# Patient Record
Sex: Female | Born: 1960 | Race: White | Hispanic: No | Smoking: Current every day smoker
Health system: Southern US, Community
[De-identification: ages and names within clinical notes are randomized; demographics above are authoritative.]

## PROBLEM LIST (undated history)

## (undated) DIAGNOSIS — K219 Gastro-esophageal reflux disease without esophagitis: Secondary | ICD-10-CM

## (undated) DIAGNOSIS — I10 Essential (primary) hypertension: Secondary | ICD-10-CM

## (undated) DIAGNOSIS — M1990 Unspecified osteoarthritis, unspecified site: Secondary | ICD-10-CM

## (undated) DIAGNOSIS — F419 Anxiety disorder, unspecified: Secondary | ICD-10-CM

## (undated) DIAGNOSIS — E7800 Pure hypercholesterolemia, unspecified: Secondary | ICD-10-CM

## (undated) HISTORY — DX: Essential (primary) hypertension: I10

## (undated) HISTORY — DX: Pure hypercholesterolemia, unspecified: E78.00

## (undated) HISTORY — DX: Unspecified osteoarthritis, unspecified site: M19.90

## (undated) HISTORY — DX: Gastro-esophageal reflux disease without esophagitis: K21.9

## (undated) HISTORY — DX: Anxiety disorder, unspecified: F41.9

---

## 1999-07-08 ENCOUNTER — Ambulatory Visit (HOSPITAL_COMMUNITY): Admission: RE | Admit: 1999-07-08 | Discharge: 1999-07-08 | Payer: Self-pay | Admitting: Pediatric Dentistry

## 1999-07-19 ENCOUNTER — Other Ambulatory Visit: Admission: RE | Admit: 1999-07-19 | Discharge: 1999-07-19 | Payer: Self-pay | Admitting: *Deleted

## 2000-01-29 ENCOUNTER — Emergency Department (HOSPITAL_COMMUNITY): Admission: EM | Admit: 2000-01-29 | Discharge: 2000-01-29 | Payer: Self-pay | Admitting: Emergency Medicine

## 2003-02-01 ENCOUNTER — Emergency Department (HOSPITAL_COMMUNITY): Admission: EM | Admit: 2003-02-01 | Discharge: 2003-02-01 | Payer: Self-pay | Admitting: Emergency Medicine

## 2004-06-25 ENCOUNTER — Ambulatory Visit (HOSPITAL_COMMUNITY): Admission: RE | Admit: 2004-06-25 | Discharge: 2004-06-25 | Payer: Self-pay | Admitting: Neurosurgery

## 2005-01-18 ENCOUNTER — Emergency Department (HOSPITAL_COMMUNITY): Admission: EM | Admit: 2005-01-18 | Discharge: 2005-01-19 | Payer: Self-pay | Admitting: Plastic Surgery

## 2006-03-29 ENCOUNTER — Ambulatory Visit: Payer: Self-pay | Admitting: Family Medicine

## 2006-04-19 ENCOUNTER — Ambulatory Visit: Payer: Self-pay | Admitting: Family Medicine

## 2006-07-26 ENCOUNTER — Ambulatory Visit: Payer: Self-pay | Admitting: Family Medicine

## 2007-03-28 ENCOUNTER — Telehealth: Payer: Self-pay | Admitting: Family Medicine

## 2007-03-30 ENCOUNTER — Telehealth: Payer: Self-pay | Admitting: Family Medicine

## 2007-07-11 ENCOUNTER — Telehealth: Payer: Self-pay | Admitting: Family Medicine

## 2007-07-24 ENCOUNTER — Ambulatory Visit: Payer: Self-pay | Admitting: Family Medicine

## 2007-07-24 DIAGNOSIS — F341 Dysthymic disorder: Secondary | ICD-10-CM | POA: Insufficient documentation

## 2007-07-24 DIAGNOSIS — F988 Other specified behavioral and emotional disorders with onset usually occurring in childhood and adolescence: Secondary | ICD-10-CM | POA: Insufficient documentation

## 2007-07-24 DIAGNOSIS — G43009 Migraine without aura, not intractable, without status migrainosus: Secondary | ICD-10-CM | POA: Insufficient documentation

## 2007-08-01 ENCOUNTER — Telehealth: Payer: Self-pay | Admitting: Family Medicine

## 2007-08-30 LAB — HM MAMMOGRAPHY

## 2007-10-03 ENCOUNTER — Telehealth: Payer: Self-pay | Admitting: Family Medicine

## 2007-12-05 ENCOUNTER — Telehealth: Payer: Self-pay | Admitting: Family Medicine

## 2007-12-18 ENCOUNTER — Telehealth: Payer: Self-pay | Admitting: Family Medicine

## 2008-01-02 ENCOUNTER — Telehealth: Payer: Self-pay | Admitting: Family Medicine

## 2008-01-25 ENCOUNTER — Telehealth: Payer: Self-pay | Admitting: Family Medicine

## 2008-01-31 ENCOUNTER — Ambulatory Visit: Payer: Self-pay | Admitting: Family Medicine

## 2008-01-31 DIAGNOSIS — M549 Dorsalgia, unspecified: Secondary | ICD-10-CM | POA: Insufficient documentation

## 2008-03-06 ENCOUNTER — Telehealth: Payer: Self-pay | Admitting: Family Medicine

## 2008-03-13 ENCOUNTER — Telehealth: Payer: Self-pay | Admitting: Family Medicine

## 2008-06-25 ENCOUNTER — Ambulatory Visit: Payer: Self-pay | Admitting: Family Medicine

## 2008-07-03 LAB — CONVERTED CEMR LAB: Pap Smear: NORMAL

## 2008-07-18 LAB — CONVERTED CEMR LAB: Pap Smear: NORMAL

## 2008-09-24 ENCOUNTER — Telehealth: Payer: Self-pay | Admitting: Family Medicine

## 2008-10-01 ENCOUNTER — Ambulatory Visit: Payer: Self-pay | Admitting: Family Medicine

## 2008-10-01 DIAGNOSIS — N938 Other specified abnormal uterine and vaginal bleeding: Secondary | ICD-10-CM | POA: Insufficient documentation

## 2008-10-01 DIAGNOSIS — N949 Unspecified condition associated with female genital organs and menstrual cycle: Secondary | ICD-10-CM

## 2008-10-01 LAB — CONVERTED CEMR LAB
Glucose, Urine, Semiquant: NEGATIVE
Nitrite: NEGATIVE
Specific Gravity, Urine: 1.02
Urobilinogen, UA: 0.2
WBC Urine, dipstick: NEGATIVE
pH: 6

## 2008-10-02 ENCOUNTER — Encounter: Payer: Self-pay | Admitting: Family Medicine

## 2008-10-07 LAB — CONVERTED CEMR LAB
ALT: 18 units/L (ref 0–35)
AST: 25 units/L (ref 0–37)
Albumin: 3.9 g/dL (ref 3.5–5.2)
Alkaline Phosphatase: 50 units/L (ref 39–117)
BUN: 14 mg/dL (ref 6–23)
Basophils Absolute: 0 10*3/uL (ref 0.0–0.1)
Basophils Relative: 0.1 % (ref 0.0–3.0)
Bilirubin, Direct: 0.1 mg/dL (ref 0.0–0.3)
CO2: 30 meq/L (ref 19–32)
Calcium: 9.4 mg/dL (ref 8.4–10.5)
Chloride: 107 meq/L (ref 96–112)
Cholesterol: 218 mg/dL (ref 0–200)
Creatinine, Ser: 1.1 mg/dL (ref 0.4–1.2)
Direct LDL: 138.1 mg/dL
Eosinophils Absolute: 0.1 10*3/uL (ref 0.0–0.7)
Eosinophils Relative: 0.7 % (ref 0.0–5.0)
FSH: 4.1 milliintl units/mL
GFR calc Af Amer: 68 mL/min
GFR calc non Af Amer: 56 mL/min
Glucose, Bld: 88 mg/dL (ref 70–99)
HCT: 40.4 % (ref 36.0–46.0)
HDL: 51 mg/dL (ref >39.0)
Hemoglobin: 14.2 g/dL (ref 12.0–15.0)
Lymphocytes Relative: 21.6 % (ref 12.0–46.0)
MCHC: 35.1 g/dL (ref 30.0–36.0)
MCV: 94.7 fL (ref 78.0–100.0)
Monocytes Absolute: 0.5 10*3/uL (ref 0.1–1.0)
Monocytes Relative: 5 % (ref 3.0–12.0)
Neutro Abs: 6.7 10*3/uL (ref 1.4–7.7)
Neutrophils Relative %: 72.6 % (ref 43.0–77.0)
Platelets: 255 10*3/uL (ref 150–400)
Potassium: 5.3 meq/L — ABNORMAL HIGH (ref 3.5–5.1)
RBC: 4.27 M/uL (ref 3.87–5.11)
RDW: 12.4 % (ref 11.5–14.6)
Sodium: 143 meq/L (ref 135–145)
TSH: 1.01 microintl units/mL (ref 0.35–5.50)
Total Bilirubin: 0.5 mg/dL (ref 0.3–1.2)
Total CHOL/HDL Ratio: 4.3
Total Protein: 7 g/dL (ref 6.0–8.3)
Triglycerides: 183 mg/dL — ABNORMAL HIGH (ref 0–149)
VLDL: 37 mg/dL (ref 0–40)
Vit D, 1,25-Dihydroxy: 15 — ABNORMAL LOW (ref 30–89)
WBC: 9.3 10*3/uL (ref 4.5–10.5)

## 2008-10-09 ENCOUNTER — Telehealth: Payer: Self-pay | Admitting: Family Medicine

## 2008-12-09 ENCOUNTER — Telehealth: Payer: Self-pay | Admitting: Family Medicine

## 2008-12-24 ENCOUNTER — Ambulatory Visit: Payer: Self-pay | Admitting: Family Medicine

## 2008-12-24 DIAGNOSIS — E559 Vitamin D deficiency, unspecified: Secondary | ICD-10-CM | POA: Insufficient documentation

## 2008-12-29 LAB — CONVERTED CEMR LAB: Vit D, 25-Hydroxy: 17 ng/mL — ABNORMAL LOW (ref 30–89)

## 2009-03-24 ENCOUNTER — Ambulatory Visit: Payer: Self-pay | Admitting: Family Medicine

## 2009-05-26 ENCOUNTER — Telehealth: Payer: Self-pay | Admitting: Family Medicine

## 2009-06-25 ENCOUNTER — Ambulatory Visit: Payer: Self-pay | Admitting: Family Medicine

## 2009-08-04 ENCOUNTER — Ambulatory Visit: Payer: Self-pay | Admitting: Family Medicine

## 2009-08-04 DIAGNOSIS — Q6650 Congenital pes planus, unspecified foot: Secondary | ICD-10-CM | POA: Insufficient documentation

## 2009-08-05 ENCOUNTER — Telehealth: Payer: Self-pay | Admitting: Family Medicine

## 2009-09-29 ENCOUNTER — Telehealth: Payer: Self-pay | Admitting: Family Medicine

## 2009-10-01 ENCOUNTER — Ambulatory Visit: Payer: Self-pay | Admitting: Family Medicine

## 2009-12-01 ENCOUNTER — Telehealth: Payer: Self-pay | Admitting: Family Medicine

## 2009-12-15 ENCOUNTER — Telehealth: Payer: Self-pay | Admitting: Family Medicine

## 2009-12-16 ENCOUNTER — Ambulatory Visit: Payer: Self-pay | Admitting: Family Medicine

## 2009-12-16 DIAGNOSIS — K219 Gastro-esophageal reflux disease without esophagitis: Secondary | ICD-10-CM | POA: Insufficient documentation

## 2009-12-30 ENCOUNTER — Telehealth: Payer: Self-pay | Admitting: Family Medicine

## 2010-01-12 ENCOUNTER — Telehealth: Payer: Self-pay | Admitting: Family Medicine

## 2010-03-03 ENCOUNTER — Telehealth: Payer: Self-pay | Admitting: Family Medicine

## 2010-03-16 ENCOUNTER — Telehealth: Payer: Self-pay | Admitting: Family Medicine

## 2010-03-23 ENCOUNTER — Emergency Department (HOSPITAL_COMMUNITY): Admission: EM | Admit: 2010-03-23 | Discharge: 2010-03-23 | Payer: Self-pay | Admitting: Emergency Medicine

## 2010-05-27 ENCOUNTER — Telehealth: Payer: Self-pay | Admitting: Family Medicine

## 2010-05-28 ENCOUNTER — Ambulatory Visit: Payer: Self-pay | Admitting: Family Medicine

## 2010-05-28 DIAGNOSIS — E663 Overweight: Secondary | ICD-10-CM | POA: Insufficient documentation

## 2010-05-28 DIAGNOSIS — H60339 Swimmer's ear, unspecified ear: Secondary | ICD-10-CM | POA: Insufficient documentation

## 2010-06-09 ENCOUNTER — Telehealth: Payer: Self-pay | Admitting: Family Medicine

## 2010-06-11 ENCOUNTER — Ambulatory Visit: Payer: Self-pay | Admitting: Family Medicine

## 2010-06-11 LAB — CONVERTED CEMR LAB
Bilirubin Urine: NEGATIVE
Glucose, Urine, Semiquant: NEGATIVE
Ketones, urine, test strip: NEGATIVE
Nitrite: NEGATIVE
Protein, U semiquant: NEGATIVE
Specific Gravity, Urine: 1.015
Urobilinogen, UA: 0.2
WBC Urine, dipstick: NEGATIVE
pH: 7.5

## 2010-06-14 LAB — CONVERTED CEMR LAB
ALT: 23 units/L (ref 0–35)
AST: 18 units/L (ref 0–37)
Albumin: 3.7 g/dL (ref 3.5–5.2)
Alkaline Phosphatase: 72 units/L (ref 39–117)
BUN: 9 mg/dL (ref 6–23)
Basophils Absolute: 0 10*3/uL (ref 0.0–0.1)
Basophils Relative: 0.3 % (ref 0.0–3.0)
Bilirubin, Direct: 0.1 mg/dL (ref 0.0–0.3)
CO2: 31 meq/L (ref 19–32)
Calcium: 8.9 mg/dL (ref 8.4–10.5)
Chloride: 98 meq/L (ref 96–112)
Cholesterol: 235 mg/dL — ABNORMAL HIGH (ref 0–200)
Creatinine, Ser: 0.8 mg/dL (ref 0.4–1.2)
Direct LDL: 132.5 mg/dL
Eosinophils Absolute: 0 10*3/uL (ref 0.0–0.7)
Eosinophils Relative: 0.4 % (ref 0.0–5.0)
GFR calc non Af Amer: 81.67 mL/min (ref >60)
Glucose, Bld: 85 mg/dL (ref 70–99)
HCT: 38.5 % (ref 36.0–46.0)
HDL: 52.3 mg/dL (ref >39.00)
Hemoglobin: 13.3 g/dL (ref 12.0–15.0)
Lymphocytes Relative: 23.8 % (ref 12.0–46.0)
Lymphs Abs: 2.2 10*3/uL (ref 0.7–4.0)
MCHC: 34.6 g/dL (ref 30.0–36.0)
MCV: 94 fL (ref 78.0–100.0)
Monocytes Absolute: 0.5 10*3/uL (ref 0.1–1.0)
Monocytes Relative: 5.1 % (ref 3.0–12.0)
Neutro Abs: 6.5 10*3/uL (ref 1.4–7.7)
Neutrophils Relative %: 70.4 % (ref 43.0–77.0)
Platelets: 277 10*3/uL (ref 150.0–400.0)
Potassium: 3.3 meq/L — ABNORMAL LOW (ref 3.5–5.1)
RBC: 4.09 M/uL (ref 3.87–5.11)
RDW: 13.5 % (ref 11.5–14.6)
Sodium: 136 meq/L (ref 135–145)
TSH: 0.61 microintl units/mL (ref 0.35–5.50)
Total Bilirubin: 0.6 mg/dL (ref 0.3–1.2)
Total CHOL/HDL Ratio: 4
Total Protein: 6.5 g/dL (ref 6.0–8.3)
Triglycerides: 307 mg/dL — ABNORMAL HIGH (ref 0.0–149.0)
VLDL: 61.4 mg/dL — ABNORMAL HIGH (ref 0.0–40.0)
WBC: 9.3 10*3/uL (ref 4.5–10.5)

## 2010-06-15 ENCOUNTER — Telehealth (INDEPENDENT_AMBULATORY_CARE_PROVIDER_SITE_OTHER): Payer: Self-pay | Admitting: *Deleted

## 2010-06-16 ENCOUNTER — Encounter: Payer: Self-pay | Admitting: Family Medicine

## 2010-06-17 ENCOUNTER — Ambulatory Visit: Payer: Self-pay | Admitting: Licensed Clinical Social Worker

## 2010-06-18 ENCOUNTER — Ambulatory Visit: Payer: Self-pay | Admitting: Family Medicine

## 2010-06-18 DIAGNOSIS — E876 Hypokalemia: Secondary | ICD-10-CM | POA: Insufficient documentation

## 2010-06-22 ENCOUNTER — Telehealth (INDEPENDENT_AMBULATORY_CARE_PROVIDER_SITE_OTHER): Payer: Self-pay | Admitting: *Deleted

## 2010-06-23 ENCOUNTER — Telehealth (INDEPENDENT_AMBULATORY_CARE_PROVIDER_SITE_OTHER): Payer: Self-pay | Admitting: *Deleted

## 2010-06-28 ENCOUNTER — Ambulatory Visit: Payer: Self-pay | Admitting: Licensed Clinical Social Worker

## 2010-07-01 ENCOUNTER — Ambulatory Visit: Payer: Self-pay | Admitting: Family Medicine

## 2010-07-01 DIAGNOSIS — M461 Sacroiliitis, not elsewhere classified: Secondary | ICD-10-CM | POA: Insufficient documentation

## 2010-07-02 LAB — CONVERTED CEMR LAB
BUN: 12 mg/dL (ref 6–23)
CO2: 32 meq/L (ref 19–32)
Calcium: 9.1 mg/dL (ref 8.4–10.5)
Chloride: 102 meq/L (ref 96–112)
Creatinine, Ser: 0.9 mg/dL (ref 0.4–1.2)
GFR calc non Af Amer: 72.09 mL/min (ref >60)
Glucose, Bld: 80 mg/dL (ref 70–99)
Potassium: 3.5 meq/L (ref 3.5–5.1)
Sodium: 140 meq/L (ref 135–145)

## 2010-07-12 ENCOUNTER — Telehealth: Payer: Self-pay | Admitting: Family Medicine

## 2010-07-19 ENCOUNTER — Ambulatory Visit: Payer: Self-pay | Admitting: Family Medicine

## 2010-07-19 ENCOUNTER — Other Ambulatory Visit: Admission: RE | Admit: 2010-07-19 | Discharge: 2010-07-19 | Payer: Self-pay | Admitting: Family Medicine

## 2010-07-19 LAB — HM PAP SMEAR

## 2010-07-20 ENCOUNTER — Telehealth: Payer: Self-pay | Admitting: Family Medicine

## 2010-07-26 LAB — CONVERTED CEMR LAB: Pap Smear: NEGATIVE

## 2010-07-27 ENCOUNTER — Telehealth: Payer: Self-pay | Admitting: Family Medicine

## 2010-08-17 ENCOUNTER — Telehealth: Payer: Self-pay | Admitting: Family Medicine

## 2010-08-24 ENCOUNTER — Encounter: Payer: Self-pay | Admitting: Family Medicine

## 2010-09-13 ENCOUNTER — Telehealth: Payer: Self-pay | Admitting: Family Medicine

## 2010-09-25 ENCOUNTER — Encounter: Payer: Self-pay | Admitting: Family Medicine

## 2010-09-30 NOTE — Progress Notes (Signed)
Summary: new rx  Phone Note Refill Request Call back at [phone] Message from:  Patient  Refills Requested: Medication #1:  VYVANSE 30 MG CAPS 1 tab by mouth q am pt needs new rx  Initial call taken by: Heron Sabins,  July 12, 2010 2:48 PM  Follow-up for Phone Call        I am OK to refill the Vyvanse 30mg  tab 1 tab by mouth once daily as long as she is comfortable staying on that she had considered switching back to Adderall at our last visit, can give 30 tabs and no rf Follow-up by: Danise Edge MD,  July 12, 2010 2:53 PM  Additional Follow-up for Phone Call Additional follow up Details #1::        Spoke with pt and pt states pharmacies still don't have the Adderall available. Informed pt that RX would be ready. Additional Follow-up by: Josph Macho RMA,  July 12, 2010 3:06 PM    Prescriptions: VYVANSE 30 MG CAPS (LISDEXAMFETAMINE DIMESYLATE) 1 tab by mouth q am  #30 x 0   Entered by:   Josph Macho RMA   Authorized by:   Danise Edge MD   Signed by:   Josph Macho RMA on 07/12/2010   Method used:   Print then Give to Patient   RxID:   [id number]

## 2010-09-30 NOTE — Assessment & Plan Note (Signed)
Summary: MED CK (REFILLS) // RS   Vital Signs:  Patient profile:   50 year old female Height:      64 inches Weight:      167 pounds BMI:     28.77 Temp:     98.7 degrees F oral BP sitting:   140 / 88  (left arm) Cuff size:   regular  Vitals Entered By: Kern Reap CMA Duncan Dull) (October 01, 2009 4:07 PM)  Reason for Visit follow up medication  History of Present Illness: This 50 year old white female who has ADD, hypertension and has had considerable anxiety depression Relates she has been much better as far as the anxiety and depression since he had been on Lexapro and we have decided to continue the Effexor  XR 150 Blood pressure had been stable and well controlled Test needs to return for Pap smear and also discussed next year as she will be 50 in time for a colonoscopic exam  her exertional asthma has been improved and she has not needed an inhaler She continues to need hydrocodone acetaminophen 10 325 for her chronic back pain which has been long-standing He GERD had been controlled with Nexium No other complaints  Allergies: No Known Drug Allergies  Review of Systems  The patient denies anorexia, fever, weight loss, weight gain, vision loss, decreased hearing, hoarseness, chest pain, syncope, dyspnea on exertion, peripheral edema, prolonged cough, headaches, hemoptysis, abdominal pain, melena, hematochezia, severe indigestion/heartburn, hematuria, incontinence, genital sores, muscle weakness, suspicious skin lesions, transient blindness, difficulty walking, depression, unusual weight change, abnormal bleeding, enlarged lymph nodes, angioedema, breast masses, and testicular masses.    Physical Exam  General:  Well-developed,well-nourished,in no acute distress; alert,appropriate and cooperative throughout examination Lungs:  Normal respiratory effort, chest expands symmetrically. Lungs are clear to auscultation, no crackles or wheezes. Heart:  Normal rate and regular  rhythm. S1 and S2 normal without gallop, murmur, click, rub or other extra sounds. Abdomen:  Bowel sounds positive,abdomen soft and non-tender without masses, organomegaly or hernias noted. Rectal:  not examined Genitalia:  not examined Psych:  Cognition and judgment appear intact. Alert and cooperative with normal attention span and concentration. No apparent delusions, illusions, hallucinations   Impression & Recommendations:  Problem # 1:  HYPERTENSION, BENIGN (ICD-401.1) Assessment Improved  Her updated medication list for this problem includes:    Hyzaar 100-25 Mg Tabs (Losartan potassium-hctz) .Marland Kitchen... 1 each day for bp  Problem # 2:  BACK PAIN (ICD-724.5) Assessment: Unchanged  Her updated medication list for this problem includes:    Hydrocodone-acetaminophen 10-325 Mg Tabs (Hydrocodone-acetaminophen) .Marland Kitchen... 1  q4h as needed pain, not over 4 per day  Problem # 3:  COMMON MIGRAINE (ICD-346.10) Assessment: Improved  Her updated medication list for this problem includes:    Hydrocodone-acetaminophen 10-325 Mg Tabs (Hydrocodone-acetaminophen) .Marland Kitchen... 1  q4h as needed pain, not over 4 per day  Problem # 4:  ANXIETY DEPRESSION (ICD-300.4) Assessment: Improved Lexapro 10 mg q. day Effexor XR 150 mg q. day  Problem # 5:  ADD (ICD-314.00) Assessment: Improved Adderall 20 mg t.i.d.  Complete Medication List: 1)  Effexor Xr 150 Mg Cp24 (Venlafaxine hcl) .... 2  qd 2)  Adderall 20 Mg Tabs (Amphetamine-dextroamphetamine) .Marland Kitchen.. 1 tab by mouth three times a day 3)  Adderall 20 Mg Tabs (Amphetamine-dextroamphetamine) .Marland Kitchen.. 1 tab three times a day fill on 3 mar 4)  Adderall 20 Mg Tabs (Amphetamine-dextroamphetamine) .Marland Kitchen.. 1 three times a day fill  3 april 5)  Klonopin 1 Mg  Tabs (Clonazepam) .Marland Kitchen.. 1 by mouth three times a day 6)  Proair Hfa 108 (90 Base) Mcg/act Aers (Albuterol sulfate) .... 2 inhalations three times a day prn 7)  Mucinex D [id number] Mg Xr12h-tab (Pseudoephedrine-guaifenesin)  .Marland Kitchen.. 1 bid 8)  Xyzal 5 Mg Tabs (Levocetirizine dihydrochloride) .Marland Kitchen.. 1 once daily  for allergy 9)  Hyzaar 100-25 Mg Tabs (Losartan potassium-hctz) .Marland Kitchen.. 1 each day for bp 10)  Albuterol Sulfate (2.5 Mg/75ml) 0.083% Nebu (Albuterol sulfate) 11)  Hydrocodone-acetaminophen 10-325 Mg Tabs (Hydrocodone-acetaminophen) .Marland Kitchen.. 1  q4h as needed pain, not over 4 per day 12)  Senokot 8.6 Mg Tabs (Sennosides) 13)  Nexium 40 Mg Cpdr (Esomeprazole magnesium) .Marland Kitchen.. 1 two times a day for 2 days then 1 per day for gerd and hyperacidity  Patient Instructions: 1)  Thjink you are doing fine 2)  since you desire to try without the Lexapro and stopped for now 3)  Continue Effexor, she noticed considerable change call and we'll we will restart Lexapro 4)  Return for her regular physical and Pap smear Prescriptions: ADDERALL 20 MG  TABS (AMPHETAMINE-DEXTROAMPHETAMINE) 1 three times a day fill  3 April  #90 x 0   Entered and Authorized by:   Judithann Sheen MD   Signed by:   Judithann Sheen MD on 10/01/2009   Method used:   Print then Give to Patient   RxID:   [id number] ADDERALL 20 MG  TABS (AMPHETAMINE-DEXTROAMPHETAMINE) 1 tab three times a day fill on 3 mar  #90 x 0   Entered and Authorized by:   Judithann Sheen MD   Signed by:   Judithann Sheen MD on 10/01/2009   Method used:   Print then Give to Patient   RxID:   [id number] ADDERALL 20 MG  TABS (AMPHETAMINE-DEXTROAMPHETAMINE) 1 tab by mouth three times a day  #90 x 0   Entered and Authorized by:   Judithann Sheen MD   Signed by:   Judithann Sheen MD on 10/01/2009   Method used:   Print then Give to Patient   RxID:   [id number]

## 2010-09-30 NOTE — Progress Notes (Signed)
  Phone Note Outgoing Call   Call placed by: Rita Ohara Call placed to: Patient Summary of Call: Left a message to see if patient came to lab on 06-18-10 but got fax tone.

## 2010-09-30 NOTE — Letter (Signed)
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   [place], [place] [address]   Phone: [phone]  Fax: [phone]    June 11, 2010   Employee:  Christina Cowan    To Whom It May Concern:   For Medical reasons, please excuse the above named employee from work for the following dates:  Start:   06/07/2010  End:   06/14/2010, may return to work on 06/15/2010  If you need additional information, please feel free to contact our office.         Sincerely,    Danise Edge MD

## 2010-09-30 NOTE — Progress Notes (Signed)
Summary: refill Vicodin  5 /325   Phone Note Call from Patient   Caller: Patient Call For: Judithann Sheen MD Summary of Call: Pt is asking for a refill on Vicodin and decrease the strength to 5/325.  The Xanax is not helping, and she is going through withdrawals. [phone] Initial call taken by: Lynann Beaver CMA,  Jan 12, 2010 8:57 AM  Follow-up for Phone Call        ok per dr Scotty Court #60 not over 2 per day.  Follow-up by: Pura Spice, RN,  Jan 12, 2010 9:38 AM    New/Updated Medications: HYDROCODONE-ACETAMINOPHEN 5-325 MG TABS (HYDROCODONE-ACETAMINOPHEN) 1 every 4-6 hrs as needed pain. Not To exceed 2 per day Prescriptions: HYDROCODONE-ACETAMINOPHEN 5-325 MG TABS (HYDROCODONE-ACETAMINOPHEN) 1 every 4-6 hrs as needed pain. Not To exceed 2 per day  #60 x 0   Entered by:   Pura Spice, RN   Authorized by:   Judithann Sheen MD   Signed by:   Lynann Beaver CMA on 01/12/2010   Method used:   Telephoned to ...       [facility]  [phone]* (retail)       [address] [place], [place]  [address]       Ph: [phone] or [phone]       Fax: [phone]   RxID:   [id number]  pt notified.

## 2010-09-30 NOTE — Progress Notes (Signed)
Summary: Rx Refill  hydrocodone   Phone Note Refill Request Call back at [phone] or [phone] Message from:  Patient on Dec 30, 2009 9:00 AM  Refills Requested: Medication #1:  HYDROCODONE-ACETAMINOPHEN 10-325 MG TABS 1  q4h as needed pain  Method Requested: Electronic Initial call taken by: Trixie Dredge,  Dec 30, 2009 9:00 AM Caller: Patient  Follow-up for Phone Call        call in #30 with no rf Follow-up by: Nelwyn Salisbury MD,  Dec 31, 2009 8:16 AM  Additional Follow-up for Phone Call Additional follow up Details #1::        notified pt requested [facility]  Additional Follow-up by: Pura Spice, RN,  Dec 31, 2009 8:58 AM    Prescriptions: HYDROCODONE-ACETAMINOPHEN 10-325 MG TABS (HYDROCODONE-ACETAMINOPHEN) 1  q4h as needed pain, not over 2 per day  #30 x 0   Entered by:   Pura Spice, RN   Authorized by:   Nelwyn Salisbury MD   Signed by:   Pura Spice, RN on 12/31/2009   Method used:   Telephoned to ...       [facility]  [phone][facility] (retail)       [address] [place], [place]  [address]       Ph: [phone] or [phone]       Fax: [phone]   RxID:   [id number]

## 2010-09-30 NOTE — Progress Notes (Signed)
Summary: needs cpx with pap appt   ---- Converted from flag ---- ---- 12/01/2009 2:45 PM, Lucy Antigua wrote: I called pt and sch her for fasting cpx on 01/13/10 at 10am. Pt said she does not want to have a pap and she is suppose to be having knee replacement surgery soon.   ---- 12/01/2009 2:14 PM, Pura Spice, RN wrote: oopps sorry Caprisha Badertscher thanks   ---- 12/01/2009 2:10 PM, Lucy Antigua wrote: Who is the pt? and I'll be happy to sch. :-)  ---- 12/01/2009 2:08 PM, Pura Spice, RN wrote: pls call sch cpx with pap unless she wants to find gyn but she needs cpx with dr Scotty Court before next refill on hydrocodone.   thnaks ------------------------------

## 2010-09-30 NOTE — Progress Notes (Signed)
Summary: new rx  adderall rx x 3   Phone Note Call from Patient Call back at [phone]   Caller: Patient Call For: Christina Sheen MD Summary of Call: pt needs generic adderall 20 mg Initial call taken by: Heron Sabins,  March 03, 2010 10:10 AM  Follow-up for Phone Call        rx ready for pick up and pt notified. Follow-up by: Pura Spice, RN,  March 04, 2010 8:10 AM    New/Updated Medications: ADDERALL 20 MG  TABS (AMPHETAMINE-DEXTROAMPHETAMINE) 1 tab by mouth three times a day fill Sept 7 2011 ADDERALL 20 MG  TABS (AMPHETAMINE-DEXTROAMPHETAMINE) 1 tab three times a day fill on  Apr 04 2010 ADDERALL 20 MG  TABS (AMPHETAMINE-DEXTROAMPHETAMINE) 1 three times a day fill  March 04 2010 Prescriptions: ADDERALL 20 MG  TABS (AMPHETAMINE-DEXTROAMPHETAMINE) 1 tab by mouth three times a day fill Sept 7 2011  #90 x 0   Entered by:   Pura Spice, RN   Authorized by:   Christina Sheen MD   Signed by:   Pura Spice, RN on 03/04/2010   Method used:   Print then Give to Patient   RxID:   [id number] ADDERALL 20 MG  TABS (AMPHETAMINE-DEXTROAMPHETAMINE) 1 tab three times a day fill on  Apr 04 2010  #90 x 0   Entered by:   Pura Spice, RN   Authorized by:   Christina Sheen MD   Signed by:   Pura Spice, RN on 03/04/2010   Method used:   Print then Give to Patient   RxID:   [id number] ADDERALL 20 MG  TABS (AMPHETAMINE-DEXTROAMPHETAMINE) 1 three times a day fill  March 04 2010  #90 x 0   Entered by:   Pura Spice, RN   Authorized by:   Christina Sheen MD   Signed by:   Pura Spice, RN on 03/04/2010   Method used:   Print then Give to Patient   RxID:   [id number]

## 2010-09-30 NOTE — Progress Notes (Signed)
Summary: Pt req ov this week re: med or req med to help with sleep  Phone Note Call from Patient Call back at [phone] cell   or work [phone]   Caller: Patient Summary of Call: Pt called and would like to come in this week to discuss meds. Pts spouse just passed away 11-30-09 and she is needing something to help her sleep at night. Please call.      Initial call taken by: Lucy Antigua,  December 15, 2009 4:02 PM  Follow-up for Phone Call        notiifed pt stataed fouod husb on 04/04/2024and difficulty handling situation appt given for tomorrow.  Follow-up by: Pura Spice, RN,  December 15, 2009 4:23 PM

## 2010-09-30 NOTE — Miscellaneous (Signed)
Summary: Controlled Substances Contract  Controlled Substances Contract   Imported By: Maryln Gottron 06/02/2010 12:41:43  _____________________________________________________________________  External Attachment:    Type:   Image     Comment:   External Document

## 2010-09-30 NOTE — Assessment & Plan Note (Signed)
Summary: TO DISCUSS SWITCHING ADD MED/NJR   Vital Signs:  Patient profile:   50 year old female Height:      64 inches (162.56 cm) Weight:      171.31 pounds (77.87 kg) O2 Sat:      96 % on Room air Temp:     98.8 degrees F (37.11 degrees C) oral Pulse rate:   106 / minute BP sitting:   132 / 80  (left arm) Cuff size:   regular  Vitals Entered By: Josph Macho RMA (May 28, 2010 8:30 AM)  O2 Flow:  Room air CC: Med check/ refill on adderall- pharmacy suggested extended release?/ CF Is Patient Diabetic? No   History of Present Illness: Patient is in today for evaluation of her ADD. She has been on Adderall for 5 + years. She feels it works well but she's been having trouble with her pharmacy lately obtaining the short acting Adderall. They recommended she try to extend release they have been easier time getting the medication and she is in agreement she reports she takes it 3 times a day she is to let if she takes this twice a day he wears off quickly. She does not have any concerning side effects of the medication no chest pain, weight loss, headaches, anxiety, insomnia and she says it helps her to focus and finished. 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 Her only complaint is 2 months worth of right ear pain she doesn't vomit sometimes hearing feels slightly decreased she's been 2 to urgent care centers once given prednisone once given some amoxicillin and neither one helped with any significant degree. No discharge itching or fever. No significant congestion, malaise, myalgias, sore throat, chest pain, palpitations, shortness of breath, GI or GU concerns at today's visit.  Current Medications (verified): 1)  Effexor Xr 150 Mg  Cp24 (Venlafaxine Hcl) .... 2  Qd 2)  Adderall 20 Mg  Tabs (Amphetamine-Dextroamphetamine) .Marland Kitchen.. 1 Tab By Mouth Three Times A Day Fill Sept 7 2011 3)  Adderall 20 Mg  Tabs (Amphetamine-Dextroamphetamine) .Marland Kitchen.. 1 Tab Three  Times A Day Fill On  Apr 04 2010 4)  Adderall 20 Mg  Tabs (Amphetamine-Dextroamphetamine) .Marland Kitchen.. 1 Three Times A Day Fill  March 04 2010 5)  Proair Hfa 108 [phone]) Mcg/act Aers (Albuterol Sulfate) .... 2 Inhalations Three Times A Day Prn 6)  Mucinex D [phone] Mg Xr12h-Tab (Pseudoephedrine-Guaifenesin) .Marland Kitchen.. 1 Bid 7)  Xyzal 5 Mg Tabs (Levocetirizine Dihydrochloride) .Marland Kitchen.. 1 Once Daily  For Allergy 8)  Hyzaar 100-25 Mg Tabs (Losartan Potassium-Hctz) .Marland Kitchen.. 1 Each Day For Bp 9)  Albuterol Sulfate (2.5 Mg/6ml) 0.083% Nebu (Albuterol Sulfate) 10)  Senokot 8.6 Mg Tabs (Sennosides) 11)  Nexium 40 Mg Cpdr (Esomeprazole Magnesium) .Marland Kitchen.. 1 Two Times A Day For 2 Days Then 1 Per Day For Gerd and Hyperacidity 12)  Alprazolam 0.5 Mg Tbdp (Alprazolam) .Marland Kitchen.. 1 Morn Midafternoon and Hs For Stress 13)  Hydrocodone-Acetaminophen 5-325 Mg Tabs (Hydrocodone-Acetaminophen) .Marland Kitchen.. 1 Every 4-6 Hrs As Needed Pain. Not To Exceed 2 Per Day  Allergies (verified): No Known Drug Allergies  Past History:  Past medical history reviewed for relevance to current acute and chronic problems. Social history (including risk factors) reviewed for relevance to current acute and chronic problems. Past medical, surgical, family and social histories (including risk factors) reviewed, and no changes noted (except as noted below).  Family History: Reviewed history and no changes required.  Social History: Reviewed history from  03/24/2009 and no changes required. Current Smoker  Review of Systems      See HPI       Flu Vaccine Consent Questions     Do you have a history of severe allergic reactions to this vaccine? no    Any prior history of allergic reactions to egg and/or gelatin? no    Do you have a sensitivity to the preservative Thimersol? no    Do you have a past history of Guillan-Barre Syndrome? no    Do you currently have an acute febrile illness? no    Have you ever had a severe reaction to latex? no    Vaccine information  given and explained to patient? yes    Are you currently pregnant? no    Lot Number:[id number]   Exp Date:02/26/2011   Site Given  Left Deltoid IM Josph Macho RMA  May 28, 2010 8:44 AM   Physical Exam  General:  Well-developed,well-nourished,in no acute distress; alert,appropriate and cooperative throughout examination Head:  Normocephalic and atraumatic without obvious abnormalities. No apparent alopecia or balding. Ears:  small raised lesion on base of right external ear canal, mildly erythematous Mouth:  Oral mucosa and oropharynx without lesions or exudates.  Teeth in good repair. Neck:  No deformities, masses, or tenderness noted. Lungs:  Normal respiratory effort, chest expands symmetrically. Lungs are clear to auscultation, no crackles or wheezes. Heart:  Normal rate and regular rhythm. S1 and S2 normal without gallop, murmur, click, rub or other extra sounds. Abdomen:  Bowel sounds positive,abdomen soft and non-tender without masses, organomegaly or hernias noted. Extremities:  No clubbing, cyanosis, edema, or deformity noted  Neurologic:  No cranial nerve deficits noted. Station and gait are normal. Plantar reflexes are down-going bilaterally. DTRs are symmetrical throughout. Sensory, motor and coordinative functions appear intact. Skin:  Intact without suspicious lesions or rashes Cervical Nodes:  No lymphadenopathy noted Psych:  Cognition and judgment appear intact. Alert and cooperative with normal attention span and concentration. No apparent delusions, illusions, hallucinations   Impression & Recommendations:  Problem # 1:  OTITIS EXTERNA, ACUTE, RIGHT (ICD-380.12)  Her updated medication list for this problem includes:    Vosol Hc 2-1 % Soln (Hydrocortisone-acetic acid) .Marland KitchenMarland KitchenMarland KitchenMarland Kitchen 5 drops right ear three times a day x 10 days and Bactrim for the follicular lesions noted.  Problem # 2:  ADD (ICD-314.00) D/C short acting  Adderall and started on XR, start with 30mg   tabs 1 qam and increase to the 2 as tolerated, reeval in 3 weeks or sooner if concerning symptoms develop  Problem # 3:  OVERWEIGHT (ICD-278.02) Review of patient's chart reveals no labs since early 2010, patient agrees to fasting labs and reeval at next visit  Complete Medication List: 1)  Effexor Xr 150 Mg Cp24 (Venlafaxine hcl) .... 2  qd 2)  Proair Hfa 108 (90 Base) Mcg/act Aers (Albuterol sulfate) .... 2 inhalations three times a day prn 3)  Mucinex D [phone] Mg Xr12h-tab (Pseudoephedrine-guaifenesin) .Marland Kitchen.. 1 bid 4)  Xyzal 5 Mg Tabs (Levocetirizine dihydrochloride) .Marland Kitchen.. 1 once daily  for allergy 5)  Hyzaar 100-25 Mg Tabs (Losartan potassium-hctz) .Marland Kitchen.. 1 each day for bp 6)  Albuterol Sulfate (2.5 Mg/17ml) 0.083% Nebu (Albuterol sulfate) 7)  Senokot 8.6 Mg Tabs (Sennosides) 8)  Nexium 40 Mg Cpdr (Esomeprazole magnesium) .Marland Kitchen.. 1 two times a day for 2 days then 1 per day for gerd and hyperacidity 9)  Alprazolam 0.5 Mg Tbdp (Alprazolam) .Marland Kitchen.. 1 morn midafternoon and hs for stress 10)  Hydrocodone-acetaminophen 5-325 Mg Tabs (Hydrocodone-acetaminophen) .Marland Kitchen.. 1 every 4-6 hrs as needed pain. not to exceed 2 per day 11)  Bactrim Ds 800-160 Mg Tabs (Sulfamethoxazole-trimethoprim) .Marland Kitchen.. 1 tab by mouth two times a day 12)  Vosol Hc 2-1 % Soln (Hydrocortisone-acetic acid) .... 5 drops right ear three times a day x 10 days 13)  Adderall Xr 30 Mg Xr24h-cap (Amphetamine-dextroamphetamine) .... 2 caps by mouth q am  Other Orders: Admin 1st Vaccine ([address]) Flu Vaccine 46yrs + ([address])  Patient Instructions: 1)  Please schedule a follow-up appointment in 3weeks  2)  Take your antibiotic as prescribed until ALL of it is gone, but stop if you develop a rash or swelling and contact our office as soon as possible.  3)  BMP prior to visit, ICD-9: all v70.0 4)  Hepatic Panel prior to visit ICD-9:  5)  Lipid panel prior to visit ICD-9 :  6)  TSH prior to visit ICD-9 :  7)  CBC w/ Diff prior to visit ICD-9 :  8)   Take a probiotic daily while on antibiotics Prescriptions: ADDERALL XR 30 MG XR24H-CAP (AMPHETAMINE-DEXTROAMPHETAMINE) 2 caps by mouth q am  #60 x 0   Entered and Authorized by:   Danise Edge MD   Signed by:   Danise Edge MD on 05/28/2010   Method used:   Print then Give to Patient   RxID:   [id number] VOSOL HC 2-1 % SOLN (HYDROCORTISONE-ACETIC ACID) 5 drops right ear three times a day x 10 days  #1 bottle x 0   Entered and Authorized by:   Danise Edge MD   Signed by:   Danise Edge MD on 05/28/2010   Method used:   Electronically to        [facility]  [phone][facility] ([facility]ail)       [address] [place], [place]  [address]       Ph: [phone] or [phone]       Fax: [phone]   RxID:   [id number] BACTRIM DS 800-160 MG TABS (SULFAMETHOXAZOLE-TRIMETHOPRIM) 1 tab by mouth two times a day  #20 x 0   Entered and Authorized by:   Danise Edge MD   Signed by:   Danise Edge MD on 05/28/2010   Method used:   Electronically to        [facility]  [phone]* (retail)       [address] [place], [place]  [address]       Ph: [phone] or [phone]       Fax: [phone]   RxID:   [id number]

## 2010-09-30 NOTE — Letter (Signed)
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         Sincerely,    Danise Edge MD

## 2010-09-30 NOTE — Assessment & Plan Note (Signed)
Summary: acute back pain radiating down leg/dm   Vital Signs:  Patient profile:   50 year old female Height:      64 inches (162.56 cm) Weight:      173.31 pounds (78.78 kg) BMI:     29.86 O2 Sat:      99 % on Room air Temp:     97.5 degrees F (36.39 degrees C) oral Pulse rate:   74 / minute BP sitting:   148 / 90  (left arm) Cuff size:   regular  Vitals Entered By: Josph Macho RMA (July 01, 2010 8:56 AM)  O2 Flow:  Room air CC: Acute back pain (right side and lower back) radiating down leg X7 days (pt states she has been moving/ CF Is Patient Diabetic? No   History of Present Illness: Patient is a 50 yo Caucasian female who is in today with persistent back pain. she reports a history of a back injury back 1985 when she fell on the edge of a boat and injured her midback. She did have a tub with back pain off and on ever since but generally can manage well. Unfortunately for the last week since she moved and had to do a lot of heavy lifting and carrying she's had persistent mid back pain more so over the left shoulder than the right. She also has been struggling with some bilateral posterior hip pain more so on the right than the left. Does have some mild radicular pain occasionally into bilateral groins but this is intermittent, no incontinence no bowel or bladder troubles no numbness tingling or weakness in her legs. She denies chest pain, palpitations, shortness breath, GI or GU complaints she has been using ibuprofen for pain relief roughly 800 mg a total day and that does help temporarily. Aleve she notes has caused upset stomach in the past. She continues to be labile and cries easily, has trouble concentrating and has not returned to work. She is now proceeding with weekly counseling sessions and does feel as though he her PTSD. She is not suicidal but continues to have trouble off and is not about returning to work she would like to stay out of work until the new year while she  proceed with counseling medications take effect and gets her life in order. She has been very labile and had difficulty with her mood since her husband checked this past spring. She is unhappy with the diet and feeling a 20 mg doesn't last long and the 30 mg a  Current Medications (verified): 1)  Effexor Xr 150 Mg  Cp24 (Venlafaxine Hcl) .... 2  Qd 2)  Proventil Hfa 108 (90 Base) Mcg/act Aers (Albuterol Sulfate) .... 2 Inhalations Three Times A Day As Needed 3)  Mucinex D [phone] Mg Xr12h-Tab (Pseudoephedrine-Guaifenesin) .Marland Kitchen.. 1 Bid 4)  Xyzal 5 Mg Tabs (Levocetirizine Dihydrochloride) .Marland Kitchen.. 1 Once Daily  For Allergy 5)  Hyzaar 100-25 Mg Tabs (Losartan Potassium-Hctz) .Marland Kitchen.. 1 Each Day For Bp 6)  Albuterol Sulfate (2.5 Mg/81ml) 0.083% Nebu (Albuterol Sulfate) 7)  Senokot 8.6 Mg Tabs (Sennosides) 8)  Alprazolam 0.5 Mg Tbdp (Alprazolam) .Marland Kitchen.. 1 Tab By Mouth Once Daily As Needed Panic To Be Used Sparingly 9)  Hydrocodone-Acetaminophen 5-325 Mg Tabs (Hydrocodone-Acetaminophen) .Marland Kitchen.. 1 Every 4-6 Hrs As Needed Pain. Not To Exceed 2 Per Day 10)  Vosol Hc 2-1 % Soln (Hydrocortisone-Acetic Acid) .... 5 Drops Right Ear Three Times A Day X 10 Days 11)  Diazepam 5 Mg Tabs (Diazepam) .Marland KitchenMarland KitchenMarland Kitchen  1/2 To 1 Tab By Mouth Q Am and 1-2 Tabs By Mouth At Bedtime As Needed Anxiety/insomnia/grief 12)  Vyvanse 30 Mg Caps (Lisdexamfetamine Dimesylate) .Marland Kitchen.. 1 Tab By Mouth Q Am 13)  Metoprolol Succinate 25 Mg Xr24h-Tab (Metoprolol Succinate) .Marland Kitchen.. 1 Tab By Mouth Once Daily 14)  Vyvanse 20 Mg Caps (Lisdexamfetamine Dimesylate) .... 2 Tabs By Mouth Daily As Directed  Allergies (verified): No Known Drug Allergies  Past History:  Past medical history reviewed for relevance to current acute and chronic problems. Social history (including risk factors) reviewed for relevance to current acute and chronic problems.  Social History: Reviewed history from 03/24/2009 and no changes required. Current Smoker  Review of Systems      See  HPI  Physical Exam  General:  Well-developed,well-nourished,in no acute distress; alert,appropriate and cooperative throughout examination Head:  Normocephalic and atraumatic without obvious abnormalities. No apparent alopecia or balding. Mouth:  Oral mucosa and oropharynx without lesions or exudates.  Teeth in good repair. Neck:  No deformities, masses, or tenderness noted. Lungs:  Normal respiratory effort, chest expands symmetrically. Lungs are clear to auscultation, no crackles or wheezes. Heart:  Normal rate and regular rhythm. S1 and S2 normal without gallop, murmur, click, rub or other extra sounds. Abdomen:  Bowel sounds positive,abdomen soft and non-tender without masses, organomegaly or hernias noted. Msk:  Pain with palp over left posterior shoulder blade, spasm noted in muscles. Mild pain with palp over posterior sacroiliac joint, none over spine or paravertebral muscles. Extremities:  No clubbing, cyanosis, edema, or deformity noted  Neurologic:  No cranial nerve deficits noted. Station and gait are normal. Plantar reflexes are down-going bilaterally. DTRs are symmetrical throughout. Sensory, motor and coordinative functions appear intact.   Impression & Recommendations:  Problem # 1:  SACROILIITIS, ACUTE (ICD-720.2) Moist heat, gentle stretching  Problem # 2:  BACK PAIN (ICD-724.5)  Her updated medication list for this problem includes:    Hydrocodone-acetaminophen 5-325 Mg Tabs (Hydrocodone-acetaminophen) .Marland Kitchen... 1 every 4-6 hrs as needed pain. not to exceed 2 per day    Ibuprofen 600 Mg Tabs (Ibuprofen) .Marland Kitchen... 1 tab by mouth three times a day with food as needed pain    Carisoprodol 350 Mg Tabs (Carisoprodol) .Marland Kitchen... 1 tab by mouth three times a day as needed pain for spasm  Problem # 3:  HYPOKALEMIA (ICD-276.8) repeat a renal panel and reeval  Problem # 4:  DEPRESSION/ANXIETY (ICD-300.4) Patient presently in weekly counselling sessions and has not felt able to return to  work. She continues to be labile, tearful and has trouble concentrating. She would like to come out on FMLA to help with her severe grief reaction secondary to her husband's death and multiple other stressors since then, will fill out the forms for her and see her again in a month or as needed.  Problem # 5:  ADD (ICD-314.00) Patient is not happy with the Vyvanse options, 20mg  does not last long enough and the 20mg  dose lasts too long. Would like to consider returning to her Adderall 20mg  tabs 1 three times a day, she will check with her pharmacist and if available she may bring in her left over Vyvanse for disposal and we can restart Adderall  Complete Medication List: 1)  Effexor Xr 150 Mg Cp24 (Venlafaxine hcl) .... 2  qd 2)  Proventil Hfa 108 (90 Base) Mcg/act Aers (Albuterol sulfate) .... 2 inhalations three times a day as needed 3)  Mucinex D [phone] Mg Xr12h-tab (Pseudoephedrine-guaifenesin) .Marland Kitchen.. 1 bid 4)  Xyzal 5 Mg Tabs (Levocetirizine dihydrochloride) .Marland Kitchen.. 1 once daily  for allergy 5)  Hyzaar 100-25 Mg Tabs (Losartan potassium-hctz) .Marland Kitchen.. 1 each day for bp 6)  Albuterol Sulfate (2.5 Mg/63ml) 0.083% Nebu (Albuterol sulfate) 7)  Senokot 8.6 Mg Tabs (Sennosides) 8)  Alprazolam 0.5 Mg Tbdp (Alprazolam) .Marland Kitchen.. 1 tab by mouth once daily as needed panic to be used sparingly 9)  Hydrocodone-acetaminophen 5-325 Mg Tabs (Hydrocodone-acetaminophen) .Marland Kitchen.. 1 every 4-6 hrs as needed pain. not to exceed 2 per day 10)  Vosol Hc 2-1 % Soln (Hydrocortisone-acetic acid) .... 5 drops right ear three times a day x 10 days 11)  Diazepam 5 Mg Tabs (Diazepam) .... 1/2 to 1 tab by mouth q am and 1-2 tabs by mouth at bedtime as needed anxiety/insomnia/grief 12)  Vyvanse 30 Mg Caps (Lisdexamfetamine dimesylate) .Marland Kitchen.. 1 tab by mouth q am 13)  Metoprolol Succinate 25 Mg Xr24h-tab (Metoprolol succinate) .Marland Kitchen.. 1 tab by mouth once daily 14)  Vyvanse 20 Mg Caps (Lisdexamfetamine dimesylate) .... 2 tabs by mouth daily as  directed 15)  Ibuprofen 600 Mg Tabs (Ibuprofen) .Marland Kitchen.. 1 tab by mouth three times a day with food as needed pain 16)  Carisoprodol 350 Mg Tabs (Carisoprodol) .Marland Kitchen.. 1 tab by mouth three times a day as needed pain for spasm  Other Orders: Venipuncture ([address]) Specimen Handling ([address]) TLB-BMP (Basic Metabolic Panel-BMET) (80048-METABOL)  Patient Instructions: 1)  Please schedule a follow-up appointment in 1 to 2 months for pap 2)  Apply moist heat and gentle stretching two times a day for next 7 days and as needed for pain Prescriptions: CARISOPRODOL 350 MG TABS (CARISOPRODOL) 1 tab by mouth three times a day as needed pain for spasm  #40 x 1   Entered and Authorized by:   Danise Edge MD   Signed by:   Danise Edge MD on 07/01/2010   Method used:   Electronically to        [facility]  [phone]* (retail)       [address] [place], [place]  [address]       Ph: [phone] or [phone]       Fax: [phone]   RxID:   [id number] IBUPROFEN 600 MG TABS (IBUPROFEN) 1 tab by mouth three times a day with food as needed pain  #90 x 1   Entered and Authorized by:   Danise Edge MD   Signed by:   Danise Edge MD on 07/01/2010   Method used:   Electronically to        [facility]  [phone]* (retail)       [address] [place], [place]  [address]       Ph: [phone] or [phone]       Fax: [phone]   RxID:   [id number]    Orders Added: 1)  Venipuncture [83151] 2)  Specimen Handling [99000] 3)  TLB-BMP (Basic Metabolic Panel-BMET) [80048-METABOL] 4)  Est. Patient Level IV [76160]

## 2010-09-30 NOTE — Letter (Signed)
Summary: Out of Work  Adult nurse at [employer]  [address]   [place], [place] [address]   Phone: [phone]  Fax: [phone]    June 16, 2010   Employee:  Christina Cowan    To Whom It May Concern:   For Medical reasons, please excuse the above named employee from work for the following dates:  Start:   06/15/10  End:   06/18/10  If you need additional information, please feel free to contact our office.         Sincerely,    Josph Macho RMA

## 2010-09-30 NOTE — Progress Notes (Signed)
Summary: no papsmear source indicated  Phone Note From Other Clinic Call back at [phone]   Caller: Other Relative Caller: sandra-cytology Summary of Call: Dois Davenport received pap smear needs to know source vaginal or cervical. Initial call taken by: Heron Sabins,  July 20, 2010 9:42 AM  Follow-up for Phone Call        cervical Follow-up by: Danise Edge MD,  July 20, 2010 9:46 AM  Additional Follow-up for Phone Call Additional follow up Details #1::        I informed Malachi Bonds Additional Follow-up by: Josph Macho RMA,  July 20, 2010 10:59 AM

## 2010-09-30 NOTE — Assessment & Plan Note (Signed)
Summary: EARS NO BETTER, F/U ON MED//SLM   Vital Signs:  Patient profile:   50 year old female Height:      64 inches Weight:      172 pounds O2 Sat:      98 % on Room air Temp:     98.1 degrees F oral Pulse rate:   93 / minute BP sitting:   136 / 82  (left arm) Cuff size:   regular  Vitals Entered By: Kathlene November LPN (June 11, 2010 10:06 AM)  O2 Flow:  Room air CC: ears not any better- still has occasional pain and muffled sound   History of Present Illness: patient is in today for tearful easily. Ostensibly is in because her ears continue to bother her with some pressure and popping but almost immediately she starts crying and talks about being widowed in April. Her 52 year old husband died suddenly she has a 24 year old son into high school. Presently living with her lesion healing hair and some time to get moved into a townhouse for herself and her son. She is having a rough time at work has been unable to go this week, cries easily, has trouble concentrating, becomes agitated. 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 She says initially they were supportive of the time has moved forward they become less supportive. She cries throughout much of the interview and is concerned about how she will manage the stress and the feeling of the wound falling in on her at this time. She says she feels as if stress is coming from every direction she has episodes of becoming excessively agitated short of breath and shaky having difficulty concentrating and becoming irritable. She says she still enjoys the work she does have trouble with her coworkers and the ongoing stress. No recent fever, chills, congestion, cough, chest pain, palpitations, GI or GU complaints at this time she notes she is not taking care of her own health has  not seen the dentist used year had her mammogram for her Pap smear  Current Medications (verified): 1)  Effexor Xr 150 Mg  Cp24 (Venlafaxine Hcl) .... 2  Qd 2)  Proair Hfa 108 (90 Base) Mcg/act Aers (Albuterol Sulfate) .... 2 Inhalations Three Times A Day Prn 3)  Mucinex D [phone] Mg Xr12h-Tab (Pseudoephedrine-Guaifenesin) .Marland Kitchen.. 1 Bid 4)  Xyzal 5 Mg Tabs (Levocetirizine Dihydrochloride) .Marland Kitchen.. 1 Once Daily  For Allergy 5)  Hyzaar 100-25 Mg Tabs (Losartan Potassium-Hctz) .Marland Kitchen.. 1 Each Day For Bp 6)  Albuterol Sulfate (2.5 Mg/66ml) 0.083% Nebu (Albuterol Sulfate) 7)  Senokot 8.6 Mg Tabs (Sennosides) 8)  Alprazolam 0.5 Mg Tbdp (Alprazolam) .Marland Kitchen.. 1 Morn Midafternoon and Hs For Stress 9)  Hydrocodone-Acetaminophen 5-325 Mg Tabs (Hydrocodone-Acetaminophen) .Marland Kitchen.. 1 Every 4-6 Hrs As Needed Pain. Not To Exceed 2 Per Day 10)  Vosol Hc 2-1 % Soln (Hydrocortisone-Acetic Acid) .... 5 Drops Right Ear Three Times A Day X 10 Days 11)  Adderall Xr 30 Mg Xr24h-Cap (Amphetamine-Dextroamphetamine) .... 2 Caps By Mouth Q Am  Allergies (verified): No Known Drug Allergies  Comments:  Nurse/Medical Assistant: The patient's medications and allergies were reviewed with the patient and were updated in the Medication and Allergy Lists. Kathlene November LPN (June 11, 2010 10:07 AM)  Past History:  Past medical history reviewed for relevance to current acute and chronic problems.  Social history (including risk factors) reviewed for relevance to current acute and chronic problems.  Social History: Reviewed history from 03/24/2009 and no changes required. Current Smoker  Review of Systems      See HPI  Physical Exam  General:  Well-developed,well-nourished,in no acute distress; alert,appropriate and cooperative throughout examination Head:  Normocephalic and atraumatic without obvious abnormalities. No apparent alopecia or balding. Eyes:  No corneal or conjunctival inflammation noted. EOMI. Perrla. Funduscopic exam  benign, without hemorrhages, exudates or papilledema. Vision grossly normal. Ears:  External ear exam shows no significant lesions or deformities.  Otoscopic examination reveals clear canals, tympanic membranes are intact bilaterally without bulging, retraction, inflammation or discharge. Hearing is grossly normal bilaterally. Nose:  External nasal examination shows no deformity or inflammation. Nasal mucosa are pink and moist without lesions or exudates. Mouth:  Oral mucosa and oropharynx without lesions or exudates.  Teeth in good repair. Neck:  No deformities, masses, or tenderness noted. Lungs:  Normal respiratory effort, chest expands symmetrically. Lungs are clear to auscultation, no crackles or wheezes. Heart:  Normal rate and regular rhythm. S1 and S2 normal without gallop, murmur, click, rub or other extra sounds. Abdomen:  Bowel sounds positive,abdomen soft and non-tender without masses, organomegaly or hernias noted. Extremities:  No clubbing, cyanosis, edema, or deformity noted with normal full range of motion of all joints.   Cervical Nodes:  No lymphadenopathy noted Psych:  depressed affect, tearful, and moderately anxious.     Impression & Recommendations:  Problem # 1:  DEPRESSION/ANXIETY (ICD-300.4) Patient presently overwhelmed by her grief. Has been out of work all week will provide her with a note to stay out until 10/18 and she agrees to set up counselling with Judithe Modest for some grief counselling. She is given Diazepam to use in replacement of alprazolam to help even out her days. No change to Effexor just yet. See for reeval in 2 weeks or as needed   Problem # 2:  ADD (ICD-314.00) Patient was forced to switch from short acting to long acting Adderall due to the shortage. Is unhappy with her response will try Vyvanse 30mg  by mouth once daily and reevaluate  Problem # 3:  Preventive Health Care (ICD-V70.0) Given order to have screening MGM performed at [facility]. Will  have her back for GYN exam in 2 weeks and she will schedule a dental appt  Complete Medication List: 1)  Effexor Xr 150 Mg Cp24 (Venlafaxine hcl) .... 2  qd 2)  Proair Hfa 108 (90 Base) Mcg/act Aers (Albuterol sulfate) .... 2 inhalations three times a day prn 3)  Mucinex D [phone] Mg Xr12h-tab (Pseudoephedrine-guaifenesin) .Marland Kitchen.. 1 bid 4)  Xyzal 5 Mg Tabs (Levocetirizine dihydrochloride) .Marland Kitchen.. 1 once daily  for allergy 5)  Hyzaar 100-25 Mg Tabs (Losartan potassium-hctz) .Marland Kitchen.. 1 each day for bp 6)  Albuterol Sulfate (2.5 Mg/50ml) 0.083% Nebu (Albuterol sulfate) 7)  Senokot 8.6 Mg Tabs (Sennosides) 8)  Alprazolam 0.5 Mg Tbdp (Alprazolam) .Marland Kitchen.. 1 tab by mouth once daily as needed panic to be used sparingly 9)  Hydrocodone-acetaminophen 5-325 Mg Tabs (Hydrocodone-acetaminophen) .Marland Kitchen.. 1 every 4-6 hrs as needed pain. not to exceed 2 per day 10)  Vosol Hc 2-1 % Soln (Hydrocortisone-acetic acid) .... 5 drops right ear three times a day x 10 days 11)  Diazepam 5 Mg Tabs (Diazepam) .... 1/2 to 1 tab by mouth q am and 1-2 tabs by mouth at bedtime as needed anxiety/insomnia/grief 12)  Vyvanse 30 Mg Caps (Lisdexamfetamine dimesylate) .Marland KitchenMarland KitchenMarland Kitchen 1  tab by mouth q am  Other Orders: Venipuncture ([address]) Specimen Handling ([address]) TLB-Lipid Panel (80061-LIPID) TLB-BMP (Basic Metabolic Panel-BMET) (80048-METABOL) TLB-CBC Platelet - w/Differential (85025-CBCD) TLB-Hepatic/Liver Function Pnl (80076-HEPATIC) TLB-TSH (Thyroid Stimulating Hormone) (84443-TSH) UA Dipstick w/o Micro (automated)  (81003)  Patient Instructions: 1)  Please schedule a follow-up appointment in 2 weeks for GYN visit 2)  Recommend staying out of work for this week and not to return to work until 06/15/2010 3)  Call and schedule appt with Judithe Modest ASAP Prescriptions: VYVANSE 30 MG CAPS (LISDEXAMFETAMINE DIMESYLATE) 1 tab by mouth q am  #30 x 0   Entered and Authorized by:   Danise Edge MD   Signed by:   Danise Edge MD on 06/11/2010   Method  used:   Print then Give to Patient   RxID:   [id number] DIAZEPAM 5 MG TABS (DIAZEPAM) 1/2 to 1 tab by mouth q am and 1-2 tabs by mouth at bedtime as needed anxiety/insomnia/grief  #60 x 1   Entered and Authorized by:   Danise Edge MD   Signed by:   Danise Edge MD on 06/11/2010   Method used:   Print then Give to Patient   RxID:   [id number]   Laboratory Results   Urine Tests  Date/Time Recieved: June 11, 2010 12:34 PM  Date/Time Reported: June 11, 2010 12:34 PM   Routine Urinalysis   Color: yellow Appearance: Clear Glucose: negative   (Normal Range: Negative) Bilirubin: negative   (Normal Range: Negative) Ketone: negative   (Normal Range: Negative) Spec. Gravity: 1.015   (Normal Range: 1.003-1.035) Blood: 1+   (Normal Range: Negative) pH: 7.5   (Normal Range: 5.0-8.0) Protein: negative   (Normal Range: Negative) Urobilinogen: 0.2   (Normal Range: 0-1) Nitrite: negative   (Normal Range: Negative) Leukocyte Esterace: negative   (Normal Range: Negative)    Comments: Wynona Canes, CMA  June 11, 2010 12:34 PM     Appended Document: EARS NO BETTER, F/U ON MED//SLM    Clinical Lists Changes  Medications: Changed medication from PROAIR HFA 108 (90 BASE) MCG/ACT AERS (ALBUTEROL SULFATE) 2 inhalations three times a day prn to PROVENTIL HFA 108 (90 BASE) MCG/ACT AERS (ALBUTEROL SULFATE) 2 inhalations three times a day as needed - Signed Changed medication from VOSOL HC 2-1 % SOLN (HYDROCORTISONE-ACETIC ACID) 5 drops right ear three times a day x 10 days to VOSOL HC 2-1 % SOLN (HYDROCORTISONE-ACETIC ACID) 5 drops right ear three times a day x 10 days - Signed Rx of PROVENTIL HFA 108 (90 BASE) MCG/ACT AERS (ALBUTEROL SULFATE) 2 inhalations three times a day as needed;  #1 x 3;  Signed;  Entered by: Josph Macho RMA;  Authorized by: Danise Edge MD;  Method used: [facility] to [facility]  [phone]*, [address], [place], [place]   [address], Ph: [phone] or [phone], Fax: [phone] Rx of VOSOL HC 2-1 % SOLN (HYDROCORTISONE-ACETIC ACID) 5 drops right ear three times a day x 10 days;  #1 x 2;  Signed;  Entered by: Josph Macho RMA;  Authorized by: Danise Edge MD;  Method used: Electronically to [facility]  [phone]*, [address] [place], [place]  [address], Ph: [phone] or [phone], Fax: [phone]    Prescriptions: VOSOL HC 2-1 % SOLN (HYDROCORTISONE-ACETIC ACID) 5 drops right ear three times a day x 10 days  #1 x 2   Entered by:   Josph Macho RMA   Authorized by:   Danise Edge MD  Signed by:   Josph Macho RMA on 06/11/2010   Method used:   Electronically to        [facility]  [phone]* (retail)       [address] [place], [place]  [address]       Ph: [phone] or [phone]       Fax: [phone]   RxID:   [id number] PROVENTIL HFA 108 (90 BASE) MCG/ACT AERS (ALBUTEROL SULFATE) 2 inhalations three times a day as needed  #1 x 3   Entered by:   Josph Macho RMA   Authorized by:   Danise Edge MD   Signed by:   Josph Macho RMA on 06/11/2010   Method used:   Electronically to        [facility]  [phone]* (retail)       [address] [place], [place]  [address]       Ph: [phone] or [phone]       Fax: [phone]   RxID:   [id number]

## 2010-09-30 NOTE — Assessment & Plan Note (Signed)
Summary: spouse passed away   Vital Signs:  Patient profile:   50 year old female Weight:      164 pounds O2 Sat:      99 % Temp:     98.2 degrees F Pulse rate:   112 / minute BP sitting:   140 / 90  (left arm)  Vitals Entered By: Pura Spice, RN (12-20-2009 4:28 PM) CC: wants meds refilled coping difficulty husb passed away 2024-04-24feels nervous on inside    History of Present Illness: This 49 year old white female who was husband expired on 11/28/09 despite the fact they had been separated but not divorced over the last year she is upset her much anxious and depressed. Cause of death is unknown she has been having considerable coping difficulties and has been taking her hydrocodone much more than she should wear her both agreed that she should discontinue the use of hot code on and will continue by decrease in the dosage over the next month due to the stress will increase her alprazolam to pull out t.i.d. for stress She continues to need her Adderall for ADD She has not been bothered with asthma recently she continues to need Nexium for esophageal reflux  Allergies (verified): No Known Drug Allergies  Past History:  Social History: Last updated: 03/24/2009 Current Smoker  Risk Factors: Smoking Status: current (03/24/2009) Packs/Day: 0.5 (03/24/2009)  Review of Systems      See HPI  The patient denies anorexia, fever, weight loss, weight gain, vision loss, decreased hearing, hoarseness, chest pain, syncope, dyspnea on exertion, peripheral edema, prolonged cough, headaches, hemoptysis, abdominal pain, melena, hematochezia, severe indigestion/heartburn, hematuria, incontinence, genital sores, muscle weakness, suspicious skin lesions, transient blindness, difficulty walking, depression, unusual weight change, abnormal bleeding, enlarged lymph nodes, angioedema, breast masses, and testicular masses.    Physical Exam  General:  Well-developed,well-nourished,in no acute  distress; alert,appropriate and cooperative throughout examination Lungs:  Normal respiratory effort, chest expands symmetrically. Lungs are clear to auscultation, no crackles or wheezes. Heart:  Normal rate and regular rhythm. S1 and S2 normal without gallop, murmur, click, rub or other extra sounds. Psych:  appears more anxious than depressed   Impression & Recommendations:  Problem # 1:  BACK PAIN (ICD-724.5) Assessment Unchanged  Her updated medication list for this problem includes:    Hydrocodone-acetaminophen 10-325 Mg Tabs (Hydrocodone-acetaminophen) .Marland Kitchen... 1  q4h as needed pain, not over 2 per day  Problem # 2:  COMMON MIGRAINE (ICD-346.10) Assessment: Unchanged  Her updated medication list for this problem includes:    Hydrocodone-acetaminophen 10-325 Mg Tabs (Hydrocodone-acetaminophen) .Marland Kitchen... 1  q4h as needed pain, not over 2 per day  Problem # 3:  ANXIETY DEPRESSION (ICD-300.4) Assessment: Deteriorated 2 continue Effexor X. are 152 q.d. plus alprazolam t.i.d.  Problem # 4:  ADD (ICD-314.00) Assessment: Unchanged  Problem # 5:  GERD (ICD-530.81) Assessment: Unchanged  Her updated medication list for this problem includes:    Nexium 40 Mg Cpdr (Esomeprazole magnesium) .Marland Kitchen... 1 two times a day for 2 days then 1 per day for gerd and hyperacidity  Complete Medication List: 1)  Effexor Xr 150 Mg Cp24 (Venlafaxine hcl) .... 2  qd 2)  Adderall 20 Mg Tabs (Amphetamine-dextroamphetamine) .Marland Kitchen.. 1 tab by mouth three times a day 3)  Adderall 20 Mg Tabs (Amphetamine-dextroamphetamine) .Marland Kitchen.. 1 tab three times a day fill on 3  june 4)  Adderall 20 Mg Tabs (Amphetamine-dextroamphetamine) .Marland Kitchen.. 1 three times a day fill  3 may  5)  Proair Hfa 108 (90 Base) Mcg/act Aers (Albuterol sulfate) .... 2 inhalations three times a day prn 6)  Mucinex D [phone] Mg Xr12h-tab (Pseudoephedrine-guaifenesin) .Marland Kitchen.. 1 bid 7)  Xyzal 5 Mg Tabs (Levocetirizine dihydrochloride) .Marland Kitchen.. 1 once daily  for allergy 8)   Hyzaar 100-25 Mg Tabs (Losartan potassium-hctz) .Marland Kitchen.. 1 each day for bp 9)  Albuterol Sulfate (2.5 Mg/13ml) 0.083% Nebu (Albuterol sulfate) 10)  Hydrocodone-acetaminophen 10-325 Mg Tabs (Hydrocodone-acetaminophen) .Marland Kitchen.. 1  q4h as needed pain, not over 2 per day 11)  Senokot 8.6 Mg Tabs (Sennosides) 12)  Nexium 40 Mg Cpdr (Esomeprazole magnesium) .Marland Kitchen.. 1 two times a day for 2 days then 1 per day for gerd and hyperacidity 13)  Alprazolam 0.5 Mg Tbdp (Alprazolam) .Marland Kitchen.. 1 morn midafternoon and hs for stress  Patient Instructions: 1)  over the next 18 days you're to decrease her hydrocodone to one twice daily 2)  Avandia 8 alprazolam for stress 3)  Continue your Nexium for esophageal reflux 4)  Contact me in 2-3 weeks to discuss your progress Prescriptions: ADDERALL 20 MG  TABS (AMPHETAMINE-DEXTROAMPHETAMINE) 1 tab three times a day fill on 3  june  #90 x 0   Entered and Authorized by:   Judithann Sheen MD   Signed by:   Judithann Sheen MD on 12/16/2009   Method used:   Print then Give to Patient   RxID:   [id number] ADDERALL 20 MG  TABS (AMPHETAMINE-DEXTROAMPHETAMINE) 1 three times a day fill  3 May  #90 x 0   Entered and Authorized by:   Judithann Sheen MD   Signed by:   Judithann Sheen MD on 12/16/2009   Method used:   Print then Give to Patient   RxID:   [id number] HYDROCODONE-ACETAMINOPHEN 10-325 MG TABS (HYDROCODONE-ACETAMINOPHEN) 1  q4h as needed pain, not over 2 per day  #36 x 0   Entered and Authorized by:   Judithann Sheen MD   Signed by:   Judithann Sheen MD on 12/16/2009   Method used:   Print then Give to Patient   RxID:   [id number] ALPRAZOLAM 0.5 MG TBDP (ALPRAZOLAM) 1 morn midafternoon and hs for stress  #90 x 1   Entered and Authorized by:   Judithann Sheen MD   Signed by:   Judithann Sheen MD on 12/16/2009   Method used:   Print then Give to Patient   RxID:   [id number]

## 2010-09-30 NOTE — Progress Notes (Signed)
Summary: refill  hydrocoodne needs ov   Phone Note From Pharmacy   [facility]  [phone]* Reason for Call: Needs renewal Summary of Call: wantas hydrocodone refilled.  Initial call taken by: Pura Spice, RN,  December 01, 2009 2:06 PM  Follow-up for Phone Call        per dr Scotty Court ok x 1 only was told in feb needs cpx and pap musst be seen before refilling. Follow-up by: Pura Spice, RN,  December 01, 2009 2:06 PM    New/Updated Medications: HYDROCODONE-ACETAMINOPHEN 10-325 MG TABS (HYDROCODONE-ACETAMINOPHEN) 1  q4h as needed pain, not over 4 per day Needs Office Visit Prescriptions: HYDROCODONE-ACETAMINOPHEN 10-325 MG TABS (HYDROCODONE-ACETAMINOPHEN) 1  q4h as needed pain, not over 4 per day Needs Office Visit  #100 x 0   Entered by:   Pura Spice, RN   Authorized by:   Judithann Sheen MD   Signed by:   Pura Spice, RN on 12/01/2009   Method used:   Telephoned to ...       [facility]  [phone]* (retail)       [address] [place], [place]  [address]       Ph: [phone] or [phone]       Fax: [phone]   RxID:   [id number]

## 2010-09-30 NOTE — Assessment & Plan Note (Signed)
Summary: pap only//ccm   Vital Signs:  Patient profile:   50 year old female Weight:      177 pounds BMI:     30.49 O2 Sat:      98 % Pulse rate:   87 / minute BP sitting:   130 / 84  (left arm) Cuff size:   regular  Vitals Entered By: Pura Spice, RN (July 19, 2010 8:46 AM) CC: pap only   History of Present Illness: this 50 year old Caucasian female in today for Pap smear. Denies any GYN complaints she was widowed last year physically active. Denies any discharge, abdominal pain, back pain, fevers, chills, lesions or breast concerns. She is on medical leave from work from multiple stressors and severe grief reaction with depression but says she is doing much better. She is happy with her counseling she believes her medications are helping her recent back and hip pain is greatly improved no new complaints at today's visit. No chest pain, palpitations, shortness of breath, congestion GI or GU complaints at this time  Allergies: No Known Drug Allergies  Past History:  Past medical history reviewed for relevance to current acute and chronic problems. Social history (including risk factors) reviewed for relevance to current acute and chronic problems.  Social History: Reviewed history from 03/24/2009 and no changes required. Current Smoker  Review of Systems      See HPI  Physical Exam  General:  Well-developed,well-nourished,in no acute distress; alert,appropriate and cooperative throughout examination Head:  Normocephalic and atraumatic without obvious abnormalities. No apparent alopecia or balding. Nose:  External nasal examination shows no deformity or inflammation. Nasal mucosa are pink and moist without lesions or exudates. Mouth:  Oral mucosa and oropharynx without lesions or exudates.  Teeth in good repair. Neck:  No deformities, masses, or tenderness noted. Chest Wall:  No deformities, masses, or tenderness noted. Lungs:  Normal respiratory effort, chest expands  symmetrically. Lungs are clear to auscultation, no crackles or wheezes. Heart:  Normal rate and regular rhythm. S1 and S2 normal without gallop, murmur, click, rub or other extra sounds. Abdomen:  Bowel sounds positive,abdomen soft and non-tender without masses, organomegaly or hernias noted. Genitalia:  Normal introitus for age, no external lesions, no vaginal discharge, mucosa pink and moist, no vaginal or cervical lesions, no vaginal atrophy, no friaility or hemorrhage, normal uterus size and position, no adnexal masses or tenderness.  Msk:  No deformity or scoliosis noted of thoracic or lumbar spine.   Extremities:  No clubbing, cyanosis, edema, or deformity noted with normal full range of motion of all joints.   Cervical Nodes:  No lymphadenopathy noted Psych:  Cognition and judgment appear intact. Alert and cooperative with normal attention span and concentration. No apparent delusions, illusions, hallucinations   Impression & Recommendations:  Problem # 1:  Screening Cervical Cancer (ICD-V76.2) Pap taken today has had Mirena since 2008 she is encouraged to have it removed in next year, she is no longer sexually active.  Problem # 2:  SACROILIITIS, ACUTE (ICD-720.2) Improved, no change in meds at this time.  Problem # 3:  GERD (ICD-530.81) No c/o. Avoid offending foods, may use Zantac as needed   Problem # 4:  DEPRESSION/ANXIETY (ICD-300.4) Improving with counselling and meds, call with concerns.  Problem # 5:  ADD (ICD-314.00) Presently on Vyvanse, may switch back to Adderall in future if it is available  Complete Medication List: 1)  Effexor Xr 150 Mg Cp24 (Venlafaxine hcl) .... 2  qd 2)  Proventil Hfa 108 (90 Base) Mcg/act Aers (Albuterol sulfate) .... 2 inhalations three times a day as needed 3)  Mucinex D [phone] Mg Xr12h-tab (Pseudoephedrine-guaifenesin) .Marland Kitchen.. 1 bid 4)  Xyzal 5 Mg Tabs (Levocetirizine dihydrochloride) .Marland Kitchen.. 1 once daily  for allergy 5)  Hyzaar 100-25 Mg  Tabs (Losartan potassium-hctz) .Marland Kitchen.. 1 each day for bp 6)  Albuterol Sulfate (2.5 Mg/30ml) 0.083% Nebu (Albuterol sulfate) 7)  Senokot 8.6 Mg Tabs (Sennosides) 8)  Alprazolam 0.5 Mg Tbdp (Alprazolam) .Marland Kitchen.. 1 tab by mouth once daily as needed panic to be used sparingly 9)  Hydrocodone-acetaminophen 5-325 Mg Tabs (Hydrocodone-acetaminophen) .Marland Kitchen.. 1 every 4-6 hrs as needed pain. not to exceed 2 per day 10)  Vosol Hc 2-1 % Soln (Hydrocortisone-acetic acid) .... 5 drops right ear three times a day x 10 days 11)  Diazepam 5 Mg Tabs (Diazepam) .... 1/2 to 1 tab by mouth q am and 1-2 tabs by mouth at bedtime as needed anxiety/insomnia/grief 12)  Vyvanse 30 Mg Caps (Lisdexamfetamine dimesylate) .Marland Kitchen.. 1 tab by mouth q am 13)  Metoprolol Succinate 25 Mg Xr24h-tab (Metoprolol succinate) .Marland Kitchen.. 1 tab by mouth once daily 14)  Vyvanse 20 Mg Caps (Lisdexamfetamine dimesylate) .... 2 tabs by mouth daily as directed 15)  Ibuprofen 600 Mg Tabs (Ibuprofen) .Marland Kitchen.. 1 tab by mouth three times a day with food as needed pain 16)  Carisoprodol 350 Mg Tabs (Carisoprodol) .Marland Kitchen.. 1 tab by mouth three times a day as needed pain for spasm  Patient Instructions: 1)  Please schedule a follow-up appointment in 1 month.    Orders Added: 1)  Est. Patient Level IV [16109]

## 2010-09-30 NOTE — Progress Notes (Signed)
Summary: please return call  Phone Note Call from Patient Call back at Home Phone [phone]   Caller: [facility] mail Summary of Call: was seen last friday and saw Dr Abner Greenspan. pt is requesting another excuse note. Wants Christy to return her call. Initial call taken by: Warnell Forester,  June 15, 2010 3:24 PM  Follow-up for Phone Call        Pt states Adderall got switched to Vyvanse and its not enough. Pt states she needs a note for this week. Pt has appt to come see MD on Friday Oct. 21, 2011. Pt states she would like a note from Oct. 18 thru Oct. 21. Faxed to [phone] attn:Phyllis McKoin.   Please inform pt  Follow-up by: Josph Macho RMA,  June 15, 2010 3:58 PM  Additional Follow-up for Phone Call Additional follow up Details #1::        OK to give Additional Follow-up by: Danise Edge MD,  June 15, 2010 5:27 PM    Additional Follow-up for Phone Call Additional follow up Details #2::    Faxed note and informed pt. Reminded pt of her appt with [place] on Oct. 21. Follow-up by: Josph Macho RMA,  June 16, 2010 9:39 AM

## 2010-09-30 NOTE — Progress Notes (Signed)
Summary: Hydrocodone refill?  Phone Note Refill Request Message from:  Fax from Pharmacy on June 09, 2010 8:20 AM  Refills Requested: Medication #1:  HYDROCODONE-ACETAMINOPHEN 5-325 MG TABS 1 every 4-6 hrs as needed pain. Not To exceed 2 per day   Dosage confirmed as above?Dosage Confirmed Please advise?  Initial call taken by: Josph Macho RMA,  June 09, 2010 8:20 AM  Follow-up for Phone Call        OK to refill with same sig, #60, 1rf Follow-up by: Danise Edge MD,  June 09, 2010 2:28 PM    Prescriptions: HYDROCODONE-ACETAMINOPHEN 5-325 MG TABS (HYDROCODONE-ACETAMINOPHEN) 1 every 4-6 hrs as needed pain. Not To exceed 2 per day  #60 x 1   Entered by:   Josph Macho RMA   Authorized by:   Danise Edge MD   Signed by:   Josph Macho RMA on 06/09/2010   Method used:   Telephoned to ...       [facility]  [phone]* (retail)       [address] [place], [place]  [address]       Ph: [phone] or [phone]       Fax: [phone]   RxID:   [id number]  Left a message on machine/ CF

## 2010-09-30 NOTE — Progress Notes (Signed)
Summary: vyvanse increase  Phone Note Call from Patient Call back at c [phone] h [phone]   Summary of Call: Vyvanse 20mg  one daily not helping.  Thought it was to be 20mg  two times a day because was on 30mg  a day and it wasn't enough.  Please consider increasing to two times a day.  Call when written ready.  Initial call taken by: Rudy Jew, RN,  June 23, 2010 3:26 PM  Follow-up for Phone Call        I informed pt of these instructions that were on the pt instruction sheet. " Take 40mg  of Vyvanse daily for 3 days if incomplete response to 50 mg per day and then notify which dose you thinks works better." Follow-up by: Josph Macho RMA,  June 23, 2010 4:48 PM

## 2010-09-30 NOTE — Assessment & Plan Note (Signed)
Summary: 3 wk rov//slm   Vital Signs:  Patient profile:   50 year old female Height:      64 inches (162.56 cm) Weight:      174 pounds (79.09 kg) O2 Sat:      98 % on Room air Temp:     97.3 degrees F (36.28 degrees C) oral Pulse rate:   95 / minute BP sitting:   150 / 98  (left arm) Cuff size:   regular  Vitals Entered By: Josph Macho RMA (June 18, 2010 2:54 PM)  O2 Flow:  Room air CC: 3 weeks follow up/ CF Is Patient Diabetic? No   History of Present Illness: Patient in today for follow up on multiple conditions, including her grieving process s/p the death of her husband this past April 201. She was pleased with her first visit with Judithe Modest for her counselling sessions. Still labile, crying but less so, able to concentrate. No recent illness, f, c, CP, palp, SOB, GI or GU c/o.  Current Medications (verified): 1)  Effexor Xr 150 Mg  Cp24 (Venlafaxine Hcl) .... 2  Qd 2)  Proventil Hfa 108 (90 Base) Mcg/act Aers (Albuterol Sulfate) .... 2 Inhalations Three Times A Day As Needed 3)  Mucinex D [phone] Mg Xr12h-Tab (Pseudoephedrine-Guaifenesin) .Marland Kitchen.. 1 Bid 4)  Xyzal 5 Mg Tabs (Levocetirizine Dihydrochloride) .Marland Kitchen.. 1 Once Daily  For Allergy 5)  Hyzaar 100-25 Mg Tabs (Losartan Potassium-Hctz) .Marland Kitchen.. 1 Each Day For Bp 6)  Albuterol Sulfate (2.5 Mg/10ml) 0.083% Nebu (Albuterol Sulfate) 7)  Senokot 8.6 Mg Tabs (Sennosides) 8)  Alprazolam 0.5 Mg Tbdp (Alprazolam) .Marland Kitchen.. 1 Tab By Mouth Once Daily As Needed Panic To Be Used Sparingly 9)  Hydrocodone-Acetaminophen 5-325 Mg Tabs (Hydrocodone-Acetaminophen) .Marland Kitchen.. 1 Every 4-6 Hrs As Needed Pain. Not To Exceed 2 Per Day 10)  Vosol Hc 2-1 % Soln (Hydrocortisone-Acetic Acid) .... 5 Drops Right Ear Three Times A Day X 10 Days 11)  Diazepam 5 Mg Tabs (Diazepam) .... 1/2 To 1 Tab By Mouth Q Am and 1-2 Tabs By Mouth At Bedtime As Needed Anxiety/insomnia/grief 12)  Vyvanse 30 Mg Caps (Lisdexamfetamine Dimesylate) .Marland Kitchen.. 1 Tab By Mouth Q  Am  Allergies (verified): No Known Drug Allergies  Past History:  Past medical history reviewed for relevance to current acute and chronic problems. Social history (including risk factors) reviewed for relevance to current acute and chronic problems.  Social History: Reviewed history from 03/24/2009 and no changes required. Current Smoker  Review of Systems      See HPI  Physical Exam  General:  Well-developed,well-nourished,in no acute distress; alert,appropriate and cooperative throughout examination Head:  Normocephalic and atraumatic without obvious abnormalities. No apparent alopecia or balding. Mouth:  Oral mucosa and oropharynx without lesions or exudates.  Teeth in good repair. Neck:  No deformities, masses, or tenderness noted. Lungs:  Normal respiratory effort, chest expands symmetrically. Lungs are clear to auscultation, no crackles or wheezes. Heart:  Normal rate and regular rhythm. S1 and S2 normal without gallop, murmur, click, rub or other extra sounds. Abdomen:  Bowel sounds positive,abdomen soft and non-tender without masses, organomegaly or hernias noted. Extremities:  No clubbing, cyanosis, edema, or deformity noted with normal full range of motion of all joints.   Cervical Nodes:  No lymphadenopathy noted Psych:  Cognition and judgment appear intact. Alert and cooperative with normal attention span and concentration. No apparent delusions, illusions, hallucinations   Impression & Recommendations:  Problem # 1:  DEPRESSION/ANXIETY (ICD-300.4) Doing slightly better  with counselling, will continue with Effexor for now and reeval in 2 weeks  Problem # 2:  OTITIS EXTERNA, ACUTE, RIGHT (ICD-380.12)  Her updated medication list for this problem includes:    Vosol Hc 2-1 % Soln (Hydrocortisone-acetic acid) .Marland KitchenMarland KitchenMarland KitchenMarland Kitchen 5 drops right ear three times a day x 10 days Resolved  Problem # 3:  GERD (ICD-530.81) No c/o, avoid offending foods  Problem # 5:  HYPOKALEMIA  (ICD-276.8)  Orders: TLB-BMP (Basic Metabolic Panel-BMET) (80048-METABOL) No change in meds  Complete Medication List: 1)  Effexor Xr 150 Mg Cp24 (Venlafaxine hcl) .... 2  qd 2)  Proventil Hfa 108 (90 Base) Mcg/act Aers (Albuterol sulfate) .... 2 inhalations three times a day as needed 3)  Mucinex D [phone] Mg Xr12h-tab (Pseudoephedrine-guaifenesin) .Marland Kitchen.. 1 bid 4)  Xyzal 5 Mg Tabs (Levocetirizine dihydrochloride) .Marland Kitchen.. 1 once daily  for allergy 5)  Hyzaar 100-25 Mg Tabs (Losartan potassium-hctz) .Marland Kitchen.. 1 each day for bp 6)  Albuterol Sulfate (2.5 Mg/23ml) 0.083% Nebu (Albuterol sulfate) 7)  Senokot 8.6 Mg Tabs (Sennosides) 8)  Alprazolam 0.5 Mg Tbdp (Alprazolam) .Marland Kitchen.. 1 tab by mouth once daily as needed panic to be used sparingly 9)  Hydrocodone-acetaminophen 5-325 Mg Tabs (Hydrocodone-acetaminophen) .Marland Kitchen.. 1 every 4-6 hrs as needed pain. not to exceed 2 per day 10)  Vosol Hc 2-1 % Soln (Hydrocortisone-acetic acid) .... 5 drops right ear three times a day x 10 days 11)  Diazepam 5 Mg Tabs (Diazepam) .... 1/2 to 1 tab by mouth q am and 1-2 tabs by mouth at bedtime as needed anxiety/insomnia/grief 12)  Vyvanse 30 Mg Caps (Lisdexamfetamine dimesylate) .Marland Kitchen.. 1 tab by mouth q am 13)  Metoprolol Succinate 25 Mg Xr24h-tab (Metoprolol succinate) .Marland Kitchen.. 1 tab by mouth once daily 14)  Vyvanse 20 Mg Caps (Lisdexamfetamine dimesylate) .... 2 tabs by mouth daily as directed  Patient Instructions: 1)  Please schedule a follow-up appointment in 2 to 3 weeks.  2)  Take 40mg  of Vyvanse daily for 3 days if incomplete response to 50 mg per day and then notify which dose you thinks works better. 3)  Recommend staying out of work for a few more days until 10/26 return Prescriptions: VYVANSE 20 MG CAPS (LISDEXAMFETAMINE DIMESYLATE) 2 tabs by mouth daily as directed  #30 x 0   Entered and Authorized by:   Danise Edge MD   Signed by:   Danise Edge MD on 06/18/2010   Method used:   Print then Give to Patient   RxID:    [id number] METOPROLOL SUCCINATE 25 MG XR24H-TAB (METOPROLOL SUCCINATE) 1 tab by mouth once daily  #30 x 1   Entered and Authorized by:   Danise Edge MD   Signed by:   Danise Edge MD on 06/18/2010   Method used:   Electronically to        [facility]  [phone]* (retail)       [address] [place], [place]  [address]       Ph: [phone] or [phone]       Fax: [phone]   RxID:   [id number] PROVENTIL HFA 108 (90 BASE) MCG/ACT AERS (ALBUTEROL SULFATE) 2 inhalations three times a day as needed  #1 x 3   Entered by:   Josph Macho RMA   Authorized by:   Danise Edge MD   Signed by:   Josph Macho RMA on 06/18/2010   Method used:   Electronically to  [facility]  [phone]* (retail)       [address] [place], [place]  [address]       Ph: [phone] or [phone]       Fax: [phone]   RxID:   [id number]    Orders Added: 1)  TLB-BMP (Basic Metabolic Panel-BMET) [80048-METABOL] 2)  Est. Patient Level IV [36644]

## 2010-09-30 NOTE — Progress Notes (Signed)
Summary: fmla extension  Phone Note Call from Patient Call back at [phone]   Caller: Patient Call For: Judithann Sheen MD Summary of Call: pt has fmla that will expire on 08-31-2010. pt would like to extend fmla until around 09-14-2010. Initial call taken by: Heron Sabins,  August 17, 2010 3:39 PM  Follow-up for Phone Call        patient would like a letter extending through end of 1/12.  she would also like a refill of adderall. Follow-up by: Kern Reap CMA Duncan Dull),  August 24, 2010 11:31 AM    New/Updated Medications: ADDERALL 20 MG TABS (AMPHETAMINE-DEXTROAMPHETAMINE) take one tab by mouth three times a day   fill in one month Prescriptions: ADDERALL 20 MG TABS (AMPHETAMINE-DEXTROAMPHETAMINE) take one tab by mouth three times a day   fill in one month  #90 x 0   Entered by:   Kern Reap CMA (AAMA)   Authorized by:   Judithann Sheen MD   Signed by:   Kern Reap CMA (AAMA) on 08/24/2010   Method used:   Print then Give to Patient   RxID:   [id number] ADDERALL 20 MG TABS (AMPHETAMINE-DEXTROAMPHETAMINE) take one tab by mouth three times a day  #90 x 0   Entered by:   Kern Reap CMA (AAMA)   Authorized by:   Judithann Sheen MD   Signed by:   Kern Reap CMA Duncan Dull) on 08/24/2010   Method used:   Print then Give to Patient   RxID:   [id number]

## 2010-09-30 NOTE — Letter (Signed)
Summary: Out of Work  Adult nurse at [employer]  [address]   [place], [place] [address]   Phone: [phone]  Fax: [phone]    June 18, 2010   Employee:  SHENA VINLUAN    To Whom It May Concern:   For Medical reasons, please excuse the above named employee from work for the following dates:  Start:   06/19/2010  End:   06/22/2010, may return to work on 06/23/2010  If you need additional information, please feel free to contact our office.         Sincerely,    Danise Edge MD

## 2010-09-30 NOTE — Progress Notes (Signed)
Summary: refill  hydrocodonewith 1 refill   Phone Note Refill Request Call back at [phone] Message from:  Patient--live call  Refills Requested: Medication #1:  HYDROCODONE-ACETAMINOPHEN 5-325 MG TABS 1 every 4-6 hrs as needed pain. Not To exceed 2 per day. [facility]  Called again. Rudy Jew, RN  March 16, 2010 3:40 PM   Initial call taken by: Warnell Forester,  March 16, 2010 3:06 PM Caller: Patient---live call  Follow-up for Phone Call        drc stafford called pt and ok this refill plus 1 more.  Follow-up by: Pura Spice, RN,  March 17, 2010 10:51 AM    Prescriptions: HYDROCODONE-ACETAMINOPHEN 5-325 MG TABS (HYDROCODONE-ACETAMINOPHEN) 1 every 4-6 hrs as needed pain. Not To exceed 2 per day  #60 x 1   Entered by:   Pura Spice, RN   Authorized by:   Judithann Sheen MD   Signed by:   Pura Spice, RN on 03/17/2010   Method used:   Telephoned to ...       [facility]  [phone]* (retail)       [address] [place], [place]  [address]       Ph: [phone] or [phone]       Fax: [phone]   RxID:   [id number]

## 2010-09-30 NOTE — Progress Notes (Signed)
Summary: switch med  Phone Note Call from Patient Call back at Work Phone [phone] Call back at [phone] cell   Caller: Patient Call For: Judithann Sheen MD Summary of Call: pt is on adderall would like to switch to vyvanse. Pt stated she can come in next wk if needed. Initial call taken by: Heron Sabins,  May 27, 2010 11:19 AM  Follow-up for Phone Call        because this is a controlled substance I will need a visit to assess this change Follow-up by: Danise Edge MD,  May 27, 2010 12:59 PM  Additional Follow-up for Phone Call Additional follow up Details #1::        OV 05-28-2010 815AM Additional Follow-up by: Heron Sabins,  May 27, 2010 4:13 PM

## 2010-09-30 NOTE — Progress Notes (Signed)
Summary: Adderal  Phone Note Call from Patient Call back at [phone]   Summary of Call: [phone] Dr. Scotty Court and seeing Dr. Abner Greenspan but was told to call [facility] and needs Adderal written prescriptions.  Does not like the Vyvance. Initial call taken by: Lynann Beaver CMA AAMA,  July 27, 2010 8:36 AM  Follow-up for Phone Call        pt called again today. wants to go back on Adderall 20 mg three times a day. she is out of meds and stated that she is about to have a "fit". Follow-up by: Warnell Forester,  July 28, 2010 9:17 AM  Additional Follow-up for Phone Call Additional follow up Details #1::        Pt needs the Adderal ASAP. Additional Follow-up by: Lynann Beaver CMA AAMA,  July 28, 2010 2:56 PM    Additional Follow-up for Phone Call Additional follow up Details #2::    pt is out completely/pt is aware rx ready for pick up Follow-up by: Heron Sabins,  July 29, 2010 12:18 PM  Additional Follow-up for Phone Call Additional follow up Details #3:: Details for Additional Follow-up Action Taken: Pt continues to call for her Adderal. Additional Follow-up by: Debby Meadows CMA AAMA,  July 29, 2010 12:47 PM  New/Updated Medications: ADDERALL 20 MG TABS (AMPHETAMINE-DEXTROAMPHETAMINE) take one tab by mouth three times a day Prescriptions: ADDERALL 20 MG TABS (AMPHETAMINE-DEXTROAMPHETAMINE) take one tab by mouth three times a day  #90 x 0   Entered by:   Kern Reap CMA (AAMA)   Authorized by:   Gordy Savers  MD   Signed by:   Kern Reap CMA (AAMA) on 07/30/2010   Method used:   Print then Give to Patient   RxID:   [id number]

## 2010-09-30 NOTE — Progress Notes (Signed)
Summary: adderall rx request  Phone Note Call from Patient Call back at [phone] or [phone]   Summary of Call: Has scheduled appointment for med review to get Adderall Rx for Thurs, but she also needs a pap smear & she doesn't think the appointment is the kind that she can get it done.  Requests that you give a written Rx for the adderall that she can pick up and she will reschedule appointment to later to include med review & pap smear.   Initial call taken by: Rudy Jew, RN,  September 29, 2009 11:00 AM  Follow-up for Phone Call        Per Dr. Scotty Court, let pt keep Thursday appt and he will speak to her then. Follow-up by: Lynann Beaver CMA,  September 30, 2009 5:57 PM

## 2010-09-30 NOTE — Progress Notes (Signed)
Summary: med refill  Phone Note Refill Request Message from:  Patient  Refills Requested: Medication #1:  HYDROCODONE-ACETAMINOPHEN 5-325 MG TABS 1 every 4-6 hrs as needed pain. Not To exceed 2 per day pt needs med refill [facility] [address]  Initial call taken by: Heron Sabins,  September 13, 2010 8:32 AM  Follow-up for Phone Call        Rx called to pharmacy Follow-up by: Alfred Levins, CMA,  September 15, 2010 4:51 PM    Prescriptions: HYDROCODONE-ACETAMINOPHEN 5-325 MG TABS (HYDROCODONE-ACETAMINOPHEN) 1 every 4-6 hrs as needed pain. Not To exceed 2 per day  #60 x 2   Entered by:   Alfred Levins, CMA   Authorized by:   Judithann Sheen MD   Signed by:   Alfred Levins, CMA on 09/15/2010   Method used:   Telephoned to ...       [facility]  [phone]* (retail)       [address] [place], [place]  [address]       Ph: [phone] or [phone]       Fax: [phone]   RxID:   [id number]

## 2010-09-30 NOTE — Letter (Signed)
Summary: Out of Work  Adult nurse at [employer]  [address]   [place], [place] [address]   Phone: [phone]  Fax: [phone]    August 24, 2010   Employee:  Christina Cowan    To Whom It May Concern:   For Medical reasons, please excuse the above named employee from work for the following dates:  Start:   06/07/10  End:   09/28/10  If you need additional information, please feel free to contact our office.         Sincerely,    Rickard Patience, MD

## 2010-10-05 ENCOUNTER — Other Ambulatory Visit: Payer: Self-pay | Admitting: Family Medicine

## 2010-10-05 DIAGNOSIS — Z1231 Encounter for screening mammogram for malignant neoplasm of breast: Secondary | ICD-10-CM

## 2010-10-12 ENCOUNTER — Ambulatory Visit (INDEPENDENT_AMBULATORY_CARE_PROVIDER_SITE_OTHER): Payer: 59 | Admitting: Family Medicine

## 2010-10-12 ENCOUNTER — Encounter: Payer: Self-pay | Admitting: Family Medicine

## 2010-10-12 VITALS — BP 126/86 | HR 106 | Temp 98.2°F | Ht 64.0 in | Wt 179.0 lb

## 2010-10-12 DIAGNOSIS — M545 Low back pain, unspecified: Secondary | ICD-10-CM

## 2010-10-12 DIAGNOSIS — I10 Essential (primary) hypertension: Secondary | ICD-10-CM

## 2010-10-12 DIAGNOSIS — G8929 Other chronic pain: Secondary | ICD-10-CM

## 2010-10-12 DIAGNOSIS — F411 Generalized anxiety disorder: Secondary | ICD-10-CM

## 2010-10-12 DIAGNOSIS — F419 Anxiety disorder, unspecified: Secondary | ICD-10-CM

## 2010-10-12 DIAGNOSIS — F988 Other specified behavioral and emotional disorders with onset usually occurring in childhood and adolescence: Secondary | ICD-10-CM

## 2010-10-12 MED ORDER — LOSARTAN POTASSIUM-HCTZ 100-25 MG PO TABS: 1.0000 | ORAL_TABLET | Freq: Every day | ORAL | Status: DC

## 2010-10-12 MED ORDER — HYDROCODONE-ACETAMINOPHEN 5-325 MG PO TABS: 1.0000 | ORAL_TABLET | Freq: Four times a day (QID) | ORAL | Status: DC | PRN

## 2010-10-12 MED ORDER — AMPHETAMINE-DEXTROAMPHETAMINE 20 MG PO TABS: 20.0000 mg | ORAL_TABLET | Freq: Three times a day (TID) | ORAL | Status: DC

## 2010-10-12 MED ORDER — ALPRAZOLAM 0.5 MG PO TABS: 0.5000 mg | ORAL_TABLET | ORAL | Status: DC | PRN

## 2010-10-24 ENCOUNTER — Encounter: Payer: Self-pay | Admitting: Family Medicine

## 2010-10-24 NOTE — Patient Instructions (Signed)
In general figure doing her well and will reveal your medications return in 3 months regarding medications and return for her scheduled physical examination

## 2010-10-24 NOTE — Progress Notes (Signed)
  Subjective:    Patient ID: Christina Cowan, female    DOB: [birth year], 50 y.o.   MRN: [id number] This 50 year old white widowed is in for discussion of her medical problems and review of her medications. Appears he continues to need her analgesics for chronic back pain but this is on a limited basis. She is also in necessity for her Adderall for ADD. She continues to have considerable stress and depression do to her family problems with an ill mother her recently deceased husband is upset over losing her  boyfriendHead GYN exam in November included Pap smear blood pressure been under good control anxiety and depression are controlled her well with diazepam and Effexor no other positive findings on review of systemsHPI    Review of Systems see H&P    Objective:   Physical Examthe patient is well-developed well-nourished female who appears to be in no distress is alert and cooperative and very pleasant HEENT negative Heart exam root reveals no cardiomegaly regular rhythm no murmurs Lungs clear palpation percussion and auscultation Blood pressure normal        Assessment & Plan:atient continues to need her medications for anxiety and depression as well as ADD. Her chronic back pain process and she continues to need her analgesics refill but control  She continues to need her medications for ADD all her medications for her chronic back pain but need to be well controlled return in 6 months and also time for the complete physical examination

## 2010-10-25 ENCOUNTER — Ambulatory Visit: Payer: Self-pay

## 2010-11-02 ENCOUNTER — Ambulatory Visit: Payer: Self-pay

## 2010-11-13 LAB — BASIC METABOLIC PANEL
BUN: 11 mg/dL (ref 6–23)
CO2: 29 mEq/L (ref 19–32)
Calcium: 9.1 mg/dL (ref 8.4–10.5)
Chloride: 102 mEq/L (ref 96–112)
Creatinine, Ser: 0.85 mg/dL (ref 0.4–1.2)
GFR calc Af Amer: >60 mL/min (ref >60)
GFR calc non Af Amer: >60 mL/min (ref >60)
Glucose, Bld: 91 mg/dL (ref 70–99)
Potassium: 3.3 mEq/L — ABNORMAL LOW (ref 3.5–5.1)
Sodium: 137 mEq/L (ref 135–145)

## 2010-11-13 LAB — CBC
HCT: 40 % (ref 36.0–46.0)
Hemoglobin: 13.8 g/dL (ref 12.0–15.0)
MCH: 32.9 pg (ref 26.0–34.0)
MCHC: 34.5 g/dL (ref 30.0–36.0)
MCV: 95.2 fL (ref 78.0–100.0)
Platelets: 249 10*3/uL (ref 150–400)
RBC: 4.2 MIL/uL (ref 3.87–5.11)
RDW: 13.9 % (ref 11.5–15.5)
WBC: 10.2 10*3/uL (ref 4.0–10.5)

## 2010-11-13 LAB — POCT CARDIAC MARKERS
CKMB, poc: <1 ng/mL — ABNORMAL LOW (ref 1.0–8.0)
CKMB, poc: <1 ng/mL — ABNORMAL LOW (ref 1.0–8.0)
Myoglobin, poc: 49.7 ng/mL (ref 12–200)
Troponin i, poc: <0.05 ng/mL (ref 0.00–0.09)
Troponin i, poc: <0.05 ng/mL (ref 0.00–0.09)

## 2010-11-13 LAB — DIFFERENTIAL
Basophils Absolute: 0 10*3/uL (ref 0.0–0.1)
Basophils Relative: 0 % (ref 0–1)
Eosinophils Absolute: 0.1 10*3/uL (ref 0.0–0.7)
Eosinophils Relative: 1 % (ref 0–5)
Lymphocytes Relative: 30 % (ref 12–46)
Lymphs Abs: 3 10*3/uL (ref 0.7–4.0)
Monocytes Absolute: 0.5 10*3/uL (ref 0.1–1.0)
Monocytes Relative: 5 % (ref 3–12)
Neutro Abs: 6.6 10*3/uL (ref 1.7–7.7)
Neutrophils Relative %: 64 % (ref 43–77)

## 2010-11-13 LAB — D-DIMER, QUANTITATIVE: D-Dimer, Quant: 0.22 ug/mL-FEU (ref 0.00–0.48)

## 2011-01-27 ENCOUNTER — Ambulatory Visit (INDEPENDENT_AMBULATORY_CARE_PROVIDER_SITE_OTHER): Payer: 59 | Admitting: Family Medicine

## 2011-01-27 ENCOUNTER — Encounter: Payer: Self-pay | Admitting: Family Medicine

## 2011-01-27 VITALS — BP 140/90 | HR 94 | Temp 99.1°F | Wt 172.0 lb

## 2011-01-27 DIAGNOSIS — F341 Dysthymic disorder: Secondary | ICD-10-CM

## 2011-01-27 DIAGNOSIS — F32A Depression, unspecified: Secondary | ICD-10-CM

## 2011-01-27 DIAGNOSIS — M9420 Chondromalacia, unspecified site: Secondary | ICD-10-CM

## 2011-01-27 DIAGNOSIS — F329 Major depressive disorder, single episode, unspecified: Secondary | ICD-10-CM

## 2011-01-27 DIAGNOSIS — I10 Essential (primary) hypertension: Secondary | ICD-10-CM

## 2011-01-27 DIAGNOSIS — F988 Other specified behavioral and emotional disorders with onset usually occurring in childhood and adolescence: Secondary | ICD-10-CM

## 2011-01-27 MED ORDER — AMPHETAMINE-DEXTROAMPHET ER 30 MG PO CP24: 30.0000 mg | ORAL_CAPSULE | ORAL | Status: DC

## 2011-01-27 MED ORDER — DICLOFENAC SODIUM 75 MG PO TBEC: 75.0000 mg | DELAYED_RELEASE_TABLET | Freq: Two times a day (BID) | ORAL | Status: DC

## 2011-02-04 ENCOUNTER — Other Ambulatory Visit: Payer: Self-pay | Admitting: Family Medicine

## 2011-02-05 ENCOUNTER — Encounter: Payer: Self-pay | Admitting: Family Medicine

## 2011-02-05 NOTE — Patient Instructions (Signed)
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

## 2011-02-05 NOTE — Progress Notes (Signed)
  Subjective:    Patient ID: Christina Cowan, female    DOB: 09-Feb-1961, 50 y.o.   MRN: [id number] This 50 year old white widow who is in to refill her medications on Adderall as well as hydrocodone Her primary complaint is that she has had left knee pain of several weeks was seen by urgent care facility where she was given diclofenac 75 mg b.i.d. Which has helped. She continues to have some pain Blood pressure done to control, continues to have some anxiety depression and did continue on same medication Asthma had been prepped and controlled well with albuterol No other symptoms on review of systems,we discussed the fact she needs to have a colonoscopic exam since she is 60 and did not have it done last year HPI    Review of Systems see history of present illness    Objective:   Physical Exam patient is a well-developed well-nourished white female who is in no distress Blood pressure 140/90 Heart and lungs no abnormality Examination of the left knee reveals tenderness under the patella, chondromalacia         Assessment & Plan:  Hypertension couldn't 10 Medications no change Chondromalacia left knee continue diclofenac 75 mg b.i.d. Anxiety and depression continue Effexor and alprazolam 2 refill meds her medication Continues to have back pain and needs soma refill

## 2011-02-08 ENCOUNTER — Encounter: Payer: Self-pay | Admitting: Gastroenterology

## 2011-02-15 ENCOUNTER — Telehealth: Payer: Self-pay

## 2011-02-15 NOTE — Telephone Encounter (Signed)
Pt called and stated that the doctor had given her adderall xr 30 mg to try but pt states that the medication is too much and she would like to try adderall xr 20mg  instead.  Please advise.

## 2011-02-17 MED ORDER — AMPHETAMINE-DEXTROAMPHET ER 20 MG PO CP24: 20.0000 mg | ORAL_CAPSULE | ORAL | Status: DC

## 2011-02-17 NOTE — Telephone Encounter (Signed)
Per Dr.Stafford pt is to call [facility] and let them know that her knee is bothering her and she will see whoever is on call.  Pt is aware of this and that her rx is ready for pickup.  Pt is to call and let us know how she repsonds to adderall xr 20mg .

## 2011-02-17 NOTE — Telephone Encounter (Signed)
Pt called to see if she could be worked in with Dr. Scotty Court because her knee is bothering her and she is not able to get in with her orthopedic doctor until next Friday.  Pt stated she would also like an update on her adderall xr 20 instead of adderall xr 30

## 2011-02-22 ENCOUNTER — Other Ambulatory Visit: Payer: Self-pay

## 2011-02-22 MED ORDER — SENNOSIDES 8.6 MG PO TABS: 1.0000 | ORAL_TABLET | Freq: Three times a day (TID) | ORAL | Status: DC

## 2011-03-08 ENCOUNTER — Ambulatory Visit (AMBULATORY_SURGERY_CENTER): Payer: 59 | Admitting: *Deleted

## 2011-03-08 VITALS — Ht 64.0 in | Wt 169.6 lb

## 2011-03-08 DIAGNOSIS — Z1211 Encounter for screening for malignant neoplasm of colon: Secondary | ICD-10-CM

## 2011-03-08 MED ORDER — MOVIPREP 100 G PO SOLR: ORAL | Status: DC

## 2011-03-16 ENCOUNTER — Telehealth: Payer: Self-pay

## 2011-03-16 DIAGNOSIS — F988 Other specified behavioral and emotional disorders with onset usually occurring in childhood and adolescence: Secondary | ICD-10-CM

## 2011-03-16 MED ORDER — AMPHETAMINE-DEXTROAMPHETAMINE 20 MG PO TABS: 20.0000 mg | ORAL_TABLET | Freq: Three times a day (TID) | ORAL | Status: DC

## 2011-03-16 NOTE — Telephone Encounter (Signed)
Pt called and stated that adderall 20 mg tid works better for her than adderall extended release. Ok per Dr. Scotty Court to give pt 3 months of adderall 20 mg tid. Pt will get rx at office visit on 03/17/2011.

## 2011-03-17 ENCOUNTER — Encounter: Payer: Self-pay | Admitting: Family Medicine

## 2011-03-17 ENCOUNTER — Ambulatory Visit (INDEPENDENT_AMBULATORY_CARE_PROVIDER_SITE_OTHER): Payer: 59 | Admitting: Family Medicine

## 2011-03-17 VITALS — BP 122/88 | HR 109 | Temp 98.3°F | Wt 168.0 lb

## 2011-03-17 DIAGNOSIS — G8929 Other chronic pain: Secondary | ICD-10-CM

## 2011-03-17 DIAGNOSIS — M1712 Unilateral primary osteoarthritis, left knee: Secondary | ICD-10-CM

## 2011-03-17 DIAGNOSIS — M1710 Unilateral primary osteoarthritis, unspecified knee: Secondary | ICD-10-CM

## 2011-03-17 DIAGNOSIS — M545 Low back pain, unspecified: Secondary | ICD-10-CM

## 2011-03-17 DIAGNOSIS — F329 Major depressive disorder, single episode, unspecified: Secondary | ICD-10-CM

## 2011-03-17 DIAGNOSIS — F341 Dysthymic disorder: Secondary | ICD-10-CM

## 2011-03-17 DIAGNOSIS — F909 Attention-deficit hyperactivity disorder, unspecified type: Secondary | ICD-10-CM

## 2011-03-17 DIAGNOSIS — F32A Depression, unspecified: Secondary | ICD-10-CM

## 2011-03-17 MED ORDER — NEOMYCIN-POLYMYXIN-HC 3.5-10000-1 OT SOLN: 3.0000 [drp] | Freq: Three times a day (TID) | OTIC | Status: AC

## 2011-03-22 ENCOUNTER — Other Ambulatory Visit: Payer: 59 | Admitting: Gastroenterology

## 2011-04-15 ENCOUNTER — Other Ambulatory Visit: Payer: Self-pay | Admitting: Family Medicine

## 2011-04-22 ENCOUNTER — Ambulatory Visit (HOSPITAL_BASED_OUTPATIENT_CLINIC_OR_DEPARTMENT_OTHER): Admission: RE | Admit: 2011-04-22 | Payer: 59 | Source: Ambulatory Visit | Admitting: Orthopedic Surgery

## 2011-04-22 ENCOUNTER — Ambulatory Visit (HOSPITAL_BASED_OUTPATIENT_CLINIC_OR_DEPARTMENT_OTHER)
Admission: RE | Admit: 2011-04-22 | Discharge: 2011-04-22 | Disposition: A | Discharge status: Home / Self Care | Payer: 59 | Source: Ambulatory Visit | Attending: Orthopedic Surgery | Admitting: Orthopedic Surgery

## 2011-04-22 DIAGNOSIS — M2240 Chondromalacia patellae, unspecified knee: Secondary | ICD-10-CM | POA: Insufficient documentation

## 2011-04-22 DIAGNOSIS — Z01812 Encounter for preprocedural laboratory examination: Secondary | ICD-10-CM | POA: Insufficient documentation

## 2011-04-22 DIAGNOSIS — M25569 Pain in unspecified knee: Secondary | ICD-10-CM | POA: Insufficient documentation

## 2011-04-22 DIAGNOSIS — Z01810 Encounter for preprocedural cardiovascular examination: Secondary | ICD-10-CM | POA: Insufficient documentation

## 2011-04-22 DIAGNOSIS — X58XXXA Exposure to other specified factors, initial encounter: Secondary | ICD-10-CM | POA: Insufficient documentation

## 2011-04-22 DIAGNOSIS — IMO0002 Reserved for concepts with insufficient information to code with codable children: Secondary | ICD-10-CM | POA: Insufficient documentation

## 2011-04-26 ENCOUNTER — Encounter: Payer: Self-pay | Admitting: Family Medicine

## 2011-04-26 NOTE — Progress Notes (Signed)
  Subjective:    Patient ID: Christina Cowan, female    DOB: 03/17/1961, 50 y.o.   MRN: [id number] This 50 year old white widow is in today to renew her medications for her multiple problems of chronic low back pain, ADD, anxiety depression GERD and pain of the left knee. She is to have arthroscopic surgery and left the 24th August by Dr. Leonette Monarch. Relates she has had some itching with a right ear canal for some time insatiable something in the ear no other symptoms on review HPI    Review of Systems see history of present illness     Objective:   Physical Exam the patient is a well-built well-nourished white female slightly overweight H. EENT reveals very minimal external otitis non infectious Lungs clear to palpation percussion auscultation Heart no cardiomegaly heart sounds are good without murmurs Left knee slightly swollen tenderness medially and laterally pain on movement Neurological no positive findings        Assessment & Plan:  ADD continue Adderall therapy Arthritis back and left knee tended diclofenac also arthroscopic surgery Anxiety and depression continue same treatment Chronic back pain Will refill analgesics hydrocodone

## 2011-04-26 NOTE — Patient Instructions (Signed)
Have refilled your medications [facility] obesity recommend you continue to return to weight reduction diet

## 2011-04-28 NOTE — Op Note (Signed)
NAMEKristia, Christina Cowan                   ACCOUNT NO.:  [id number]  MEDICAL RECORD NO.:  [id number]  LOCATION:                                 FACILITY:  PHYSICIAN:  Ollen Gross, M.D.         DATE OF BIRTH:  DATE OF PROCEDURE:  04/22/2011 DATE OF DISCHARGE:                              OPERATIVE REPORT   PREOPERATIVE DIAGNOSIS:  Left knee medial meniscal tear.  POSTOPERATIVE DIAGNOSES:  Left knee medial meniscal tear, plus chondral defect, medial femoral condyle and patella.  PROCEDURE:  Left knee arthroscopy with meniscal debridement and chondroplasty.  SURGEON:  Ollen Gross, MD  ASSISTANT:  None.  ANESTHESIA:  General.  ESTIMATED BLOOD LOSS:  Minimal.  DRAINS:  None.  COMPLICATIONS:  None.  CONDITION:  Stable, to recovery.  CLINICAL NOTE:  [name] is a 50 year old female who has had a several- month history of significantly worsening left knee pain, recurrent effusions and mechanical symptoms.  Exam and history suggested a medial meniscal tear, confirmed by MRI.  I felt that she may have also had a chondral defect based on her recurrent effusions.  She presents now for arthroscopy and debridement.  PROCEDURE IN DETAIL:  After successful administration of general anesthetic, a tourniquet was placed high on the left thigh, and left lower extremity prepped and draped in the usual sterile fashion. Standard superomedial and inferolateral incisions were made and flow cannula passed superomedial, camera passed inferolateral.  Arthroscopic visualization proceeds.  Undersurface of patella shows a focal area about 1 x 1 cm of exposed bone with a large cartilaginous flap emanating from the superior edge of this.  There was some chondromalacia on the majority of the patella but this one area was the most pronounced.  The trochlea surprisingly looks normal.  The medial and lateral gutters were visualized; there were no loose bodies.  Flexion valgus force applied to the knee  and the medial compartment was entered.  There was evidence of significant tear in the body and anterior and posterior horns of the medial meniscus.  Spinal needle was used to localize the inferomedial portal, small incision made and dilator placed.  Meniscus was debrided back to a stable base with baskets and a 4.2-mm shaver and then sealed off with the ArthroCare device.  There was evidence of partial thickness cartilage loss and delamination on the medial femoral condyle.  This was debrided back to stable cartilaginous base with stable cartilaginous edges.  We did not get down to exposed bone with this defect; area was about 1 x 1 cm.  Intercondylar notch was visualized.  The ACL was normal.  Lateral compartment was entered and it looks normal.  Joints were again inspected.  No other tears, defects or loose bodies were noted.  Arthroscopic equipment was removed from the inferior portals, which were closed with interrupted 4-0 nylon.  20 mL of 0.25% Marcaine with epinephrine injected through the inflow cannula.  Then that was removed and that portal closed with nylon.  Incisions cleaned and dried and a bulky sterile dressing applied.  She was then awakened and transported to recovery in stable condition.  Ollen Gross, M.D.     FA/MEDQ  D:  04/22/2011  T:  04/22/2011  Job:  [id number]  Electronically Signed by Ollen Gross M.D. on 04/28/2011 04:04:10 PM

## 2011-06-17 ENCOUNTER — Encounter: Payer: Self-pay | Admitting: Family Medicine

## 2011-06-17 ENCOUNTER — Ambulatory Visit (INDEPENDENT_AMBULATORY_CARE_PROVIDER_SITE_OTHER): Payer: 59 | Admitting: Family Medicine

## 2011-06-17 VITALS — BP 140/90 | HR 109 | Temp 98.5°F | Wt 176.0 lb

## 2011-06-17 DIAGNOSIS — F988 Other specified behavioral and emotional disorders with onset usually occurring in childhood and adolescence: Secondary | ICD-10-CM

## 2011-06-17 DIAGNOSIS — F419 Anxiety disorder, unspecified: Secondary | ICD-10-CM

## 2011-06-17 DIAGNOSIS — Z23 Encounter for immunization: Secondary | ICD-10-CM

## 2011-06-17 DIAGNOSIS — F411 Generalized anxiety disorder: Secondary | ICD-10-CM

## 2011-06-17 MED ORDER — ALPRAZOLAM 0.5 MG PO TABS: 0.5000 mg | ORAL_TABLET | Freq: Three times a day (TID) | ORAL | Status: DC | PRN

## 2011-06-17 MED ORDER — AMPHETAMINE-DEXTROAMPHETAMINE 20 MG PO TABS: 20.0000 mg | ORAL_TABLET | Freq: Three times a day (TID) | ORAL | Status: DC

## 2011-06-17 NOTE — Progress Notes (Signed)
  Subjective:    Patient ID: Christina Cowan, female    DOB: Jul 08, 1961, 50 y.o.   MRN: [id number]  HPI Here for refills. She is doing well in general. She recently had arthroscopy on the left knee, and this went well. She starts rehab today. Her ADHD and anxiety are stable.    Review of Systems  Constitutional: Negative.   Musculoskeletal: Positive for arthralgias.  Psychiatric/Behavioral: Negative for dysphoric mood and decreased concentration. The patient is nervous/anxious.        Objective:   Physical Exam  Constitutional: She is oriented to person, place, and time. She appears well-developed and well-nourished.  Neurological: She is alert and oriented to person, place, and time. She has normal reflexes. No cranial nerve deficit. She exhibits normal muscle tone. Coordination normal.  Psychiatric: She has a normal mood and affect. Her behavior is normal. Thought content normal.          Assessment & Plan:  Refilled meds for 3 months until she can follow up with Dr. Scotty Court

## 2011-06-17 NOTE — Progress Notes (Signed)
Addended by: Aniceto Boss A on: 06/17/2011 02:42 PM   Modules accepted: Orders

## 2011-07-11 ENCOUNTER — Other Ambulatory Visit: Payer: Self-pay

## 2011-07-11 MED ORDER — ALBUTEROL 90 MCG/ACT IN AERS: 2.0000 | INHALATION_SPRAY | Freq: Four times a day (QID) | RESPIRATORY_TRACT | Status: DC | PRN

## 2011-07-11 NOTE — Telephone Encounter (Signed)
rx sent to pharmacy

## 2011-07-12 ENCOUNTER — Other Ambulatory Visit: Payer: Self-pay | Admitting: Family Medicine

## 2011-09-05 ENCOUNTER — Telehealth: Payer: Self-pay | Admitting: Family Medicine

## 2011-09-05 NOTE — Telephone Encounter (Signed)
Refill request for Adderall 20 mg take 1 po tid and Norco 5/325 mg take 1 po q6hrs prn and pt last here on 06/17/11.

## 2011-09-06 NOTE — Telephone Encounter (Signed)
She will actually see Pad[facility] as a new patient, and she can do these refills

## 2011-09-06 NOTE — Telephone Encounter (Signed)
I tried to call both numbers, no answer and no option to leave a voice message.

## 2011-09-06 NOTE — Telephone Encounter (Signed)
Yes, we can discussed at the OV.

## 2011-09-07 ENCOUNTER — Ambulatory Visit: Payer: 59 | Admitting: Family

## 2011-09-08 ENCOUNTER — Ambulatory Visit (INDEPENDENT_AMBULATORY_CARE_PROVIDER_SITE_OTHER): Payer: 59 | Admitting: Family

## 2011-09-08 ENCOUNTER — Encounter: Payer: Self-pay | Admitting: Family

## 2011-09-08 DIAGNOSIS — I10 Essential (primary) hypertension: Secondary | ICD-10-CM

## 2011-09-08 DIAGNOSIS — F411 Generalized anxiety disorder: Secondary | ICD-10-CM

## 2011-09-08 DIAGNOSIS — M545 Low back pain, unspecified: Secondary | ICD-10-CM

## 2011-09-08 DIAGNOSIS — F419 Anxiety disorder, unspecified: Secondary | ICD-10-CM

## 2011-09-08 DIAGNOSIS — G8929 Other chronic pain: Secondary | ICD-10-CM

## 2011-09-08 DIAGNOSIS — F988 Other specified behavioral and emotional disorders with onset usually occurring in childhood and adolescence: Secondary | ICD-10-CM

## 2011-09-08 MED ORDER — LOSARTAN POTASSIUM-HCTZ 100-25 MG PO TABS: 1.0000 | ORAL_TABLET | Freq: Every day | ORAL | Status: DC

## 2011-09-08 MED ORDER — ALBUTEROL 90 MCG/ACT IN AERS: 2.0000 | INHALATION_SPRAY | Freq: Four times a day (QID) | RESPIRATORY_TRACT | Status: DC | PRN

## 2011-09-08 MED ORDER — ALPRAZOLAM 0.5 MG PO TABS: 0.5000 mg | ORAL_TABLET | Freq: Three times a day (TID) | ORAL | Status: DC | PRN

## 2011-09-08 MED ORDER — ALBUTEROL SULFATE (2.5 MG/3ML) 0.083% IN NEBU: 2.5000 mg | INHALATION_SOLUTION | Freq: Four times a day (QID) | RESPIRATORY_TRACT | Status: DC | PRN

## 2011-09-08 MED ORDER — VENLAFAXINE HCL ER 150 MG PO CP24: 150.0000 mg | ORAL_CAPSULE | Freq: Two times a day (BID) | ORAL | Status: DC

## 2011-09-08 MED ORDER — AMPHETAMINE-DEXTROAMPHETAMINE 20 MG PO TABS: 20.0000 mg | ORAL_TABLET | Freq: Three times a day (TID) | ORAL | Status: DC

## 2011-09-08 MED ORDER — DICLOFENAC SODIUM 75 MG PO TBEC: 75.0000 mg | DELAYED_RELEASE_TABLET | Freq: Two times a day (BID) | ORAL | Status: DC

## 2011-09-08 MED ORDER — HYDROCODONE-ACETAMINOPHEN 5-325 MG PO TABS: 1.0000 | ORAL_TABLET | Freq: Four times a day (QID) | ORAL | Status: DC | PRN

## 2011-09-08 NOTE — Patient Instructions (Signed)
Back Pain, Adult Low back pain is very common. About 1 in 5 people have back pain.The cause of low back pain is rarely dangerous. The pain often gets better over time.About half of people with a sudden onset of back pain feel better in just 2 weeks. About 8 in 10 people feel better by 6 weeks.  CAUSES Some common causes of back pain include:  Strain of the muscles or ligaments supporting the spine.   Wear and tear (degeneration) of the spinal discs.   Arthritis.   Direct injury to the back.  DIAGNOSIS Most of the time, the direct cause of low back pain is not known.However, back pain can be treated effectively even when the exact cause of the pain is unknown.Answering your caregiver's questions about your overall health and symptoms is one of the most accurate ways to make sure the cause of your pain is not dangerous. If your caregiver needs more information, he or she may order lab work or imaging tests (X-rays or MRIs).However, even if imaging tests show changes in your back, this usually does not require surgery. HOME CARE INSTRUCTIONS For many people, back pain returns.Since low back pain is rarely dangerous, it is often a condition that people can learn to manageon their own.   Remain active. It is stressful on the back to sit or stand in one place. Do not sit, drive, or stand in one place for more than 30 minutes at a time. Take short walks on level surfaces as soon as pain allows.Try to increase the length of time you walk each day.   Do not stay in bed.Resting more than 1 or 2 days can delay your recovery.   Do not avoid exercise or work.Your body is made to move.It is not dangerous to be active, even though your back may hurt.Your back will likely heal faster if you return to being active before your pain is gone.   Pay attention to your body when you bend and lift. Many people have less discomfortwhen lifting if they bend their knees, keep the load close to their  bodies,and avoid twisting. Often, the most comfortable positions are those that put less stress on your recovering back.   Find a comfortable position to sleep. Use a firm mattress and lie on your side with your knees slightly bent. If you lie on your back, put a pillow under your knees.   Only take over-the-counter or prescription medicines as directed by your caregiver. Over-the-counter medicines to reduce pain and inflammation are often the most helpful.Your caregiver may prescribe muscle relaxant drugs.These medicines help dull your pain so you can more quickly return to your normal activities and healthy exercise.   Put ice on the injured area.   Put ice in a plastic bag.   Place a towel between your skin and the bag.   Leave the ice on for 15 to 20 minutes, 3 to 4 times a day for the first 2 to 3 days. After that, ice and heat may be alternated to reduce pain and spasms.   Ask your caregiver about trying back exercises and gentle massage. This may be of some benefit.   Avoid feeling anxious or stressed.Stress increases muscle tension and can worsen back pain.It is important to recognize when you are anxious or stressed and learn ways to manage it.Exercise is a great option.  SEEK MEDICAL CARE IF:  You have pain that is not relieved with rest or medicine.   You have   pain that does not improve in 1 week.   You have new symptoms.   You are generally not feeling well.  SEEK IMMEDIATE MEDICAL CARE IF:   You have pain that radiates from your back into your legs.   You develop new bowel or bladder control problems.   You have unusual weakness or numbness in your arms or legs.   You develop nausea or vomiting.   You develop abdominal pain.   You feel faint.  Document Released: 08/15/2005 Document Revised: 04/27/2011 Document Reviewed: 01/03/2011 [employer] Patient Information 2012 [employer]. 

## 2011-09-08 NOTE — Progress Notes (Signed)
Subjective:    Patient ID: Christina Cowan, female    DOB: April 24, 1961, 51 y.o.   MRN: [id number]  HPI 51 year old white female, former patient of Dr. Scotty Court is in today for recheck of anxiety, ADD, hypertension and asthma. She's been doing well on her current medication regimen. Denies any concerns today. She has an IUD that is due to be removed in April. Her last physical has been almost a year ago.   Review of Systems  Constitutional: Negative.   HENT: Negative.   Respiratory: Negative.   Cardiovascular: Negative.   Gastrointestinal: Negative.   Genitourinary: Negative.   Musculoskeletal: Negative.   Neurological: Negative.   Hematological: Negative.   Psychiatric/Behavioral: Negative.    Past Medical History  Diagnosis Date  . Asthma   . GERD (gastroesophageal reflux disease)   . Anxiety   . Arthritis     History   Social History  . Marital Status: Widowed    Spouse Name: N/A    Number of Children: N/A  . Years of Education: N/A   Occupational History  . Not on file.   Social History Main Topics  . Smoking status: Current Everyday Smoker -- 0.8 packs/day    Types: Cigarettes  . Smokeless tobacco: Never Used  . Alcohol Use: 2.4 oz/week    4 Glasses of wine per week     occ  . Drug Use: Yes    Special: Hydrocodone  . Sexually Active: Yes    Birth Control/ Protection: IUD   Other Topics Concern  . Not on file   Social History Narrative  . No narrative on file    Past Surgical History  Procedure Date  . Cesarean section     Family History  Problem Relation Age of Onset  . Arthritis Mother   . Hypertension Mother   . Arthritis Father   . Colon cancer Paternal Uncle     No Known Allergies  Current Outpatient Prescriptions on File Prior to Visit  Medication Sig Dispense Refill  . carisoprodol (SOMA) 350 MG tablet Take 350 mg by mouth 3 (three) times daily as needed. For spasms       . ibuprofen (ADVIL,MOTRIN) 600 MG tablet Take 600 mg by mouth 3  (three) times daily with meals. Prn for pain       . senna (SENOKOT) 8.6 MG tablet Take 1 tablet by mouth 3 (three) times daily.  90 tablet  4  . sennosides-docusate sodium (SENOKOT-S) 8.6-50 MG tablet Take 1 tablet by mouth as needed.        . VENTOLIN HFA 108 (90 BASE) MCG/ACT inhaler 2 INHALATIONS THREE TIMES A DAY AS NEEDED  18 g  3  . diazepam (VALIUM) 5 MG tablet Take 5 mg by mouth 2 (two) times daily as needed. For anxiety, insomnia, and grief         BP 160/90  Temp(Src) 98.7 F (37.1 C) (Oral)  Wt 178 lb (80.74 kg)chart    Objective:   Physical Exam  Constitutional: She is oriented to person, place, and time. She appears well-developed and well-nourished.  HENT:  Right Ear: External ear normal.  Left Ear: External ear normal.  Nose: Nose normal.  Mouth/Throat: Oropharynx is clear and moist.  Neck: Normal range of motion. Neck supple.  Cardiovascular: Normal rate, regular rhythm and normal heart sounds.   Pulmonary/Chest: Effort normal and breath sounds normal.  Abdominal: Soft. Bowel sounds are normal.  Musculoskeletal: Normal range of motion.  Neurological: She is alert  and oriented to person, place, and time.  Skin: Skin is warm and dry.  Psychiatric: She has a normal mood and affect.          Assessment & Plan:  Assessment: ADD, Anxiety, Asthma, hypertension  Plan: Meds refilled. Continue current medication regimen. Patient will return in April for a CPX and IUD removal. Patient to call the office if any questions or concerns prior to her complete physical exam, recheck as scheduled and when necessary.

## 2011-10-07 ENCOUNTER — Telehealth: Payer: Self-pay | Admitting: Family

## 2011-10-07 NOTE — Telephone Encounter (Signed)
Patient states she should get more pills than was prescribed by the NP. Please advise and inform patient.

## 2011-10-07 NOTE — Telephone Encounter (Signed)
Attempted to call pt to no avail

## 2011-10-11 ENCOUNTER — Telehealth: Payer: Self-pay | Admitting: Family

## 2011-10-11 DIAGNOSIS — F988 Other specified behavioral and emotional disorders with onset usually occurring in childhood and adolescence: Secondary | ICD-10-CM

## 2011-10-11 MED ORDER — AMPHETAMINE-DEXTROAMPHETAMINE 20 MG PO TABS: 20.0000 mg | ORAL_TABLET | Freq: Three times a day (TID) | ORAL | Status: DC

## 2011-10-11 NOTE — Telephone Encounter (Signed)
Pt aware.

## 2011-10-11 NOTE — Telephone Encounter (Signed)
Patient called requesting a refill on her adderall. Please assist and inform patient when available for pick up.

## 2011-10-13 ENCOUNTER — Telehealth: Payer: Self-pay | Admitting: Family

## 2011-10-13 MED ORDER — HYDROCODONE-ACETAMINOPHEN 5-325 MG PO TABS: 1.0000 | ORAL_TABLET | Freq: Four times a day (QID) | ORAL | Status: AC | PRN

## 2011-10-13 NOTE — Telephone Encounter (Signed)
Rx for #100 d/c and #60 with 5 refills phoned in to pharmacy.

## 2011-10-13 NOTE — Telephone Encounter (Signed)
Pt called re: her script for HYDROcodone-acetaminophen (NORCO) 5-325 MG per tablet . Pt is wondering if this script could be re-written for #60 take 1 pill 2 xs day as needed, so this will be a 30 day supply. [facility] [address]. Pls call pt if any questions and notify when this has been done.

## 2011-11-08 ENCOUNTER — Other Ambulatory Visit: Payer: Self-pay | Admitting: Family Medicine

## 2011-12-08 ENCOUNTER — Encounter (HOSPITAL_COMMUNITY): Payer: Self-pay | Admitting: *Deleted

## 2011-12-08 ENCOUNTER — Emergency Department (HOSPITAL_COMMUNITY)
Admission: EM | Admit: 2011-12-08 | Discharge: 2011-12-08 | Discharge status: Against Medical Advice | Payer: Self-pay | Attending: Emergency Medicine | Admitting: Emergency Medicine

## 2011-12-08 DIAGNOSIS — R072 Precordial pain: Secondary | ICD-10-CM | POA: Insufficient documentation

## 2011-12-08 NOTE — ED Notes (Signed)
Pt states she stated to have chest pain last night . Pt states she is having sternal pain that radiates to the left side of her chest under her breast. Pt denies any n/v.

## 2011-12-08 NOTE — ED Notes (Signed)
Called x 2 with no answer. RN went into waiting room to make sure pt was not somewhere unable to hear name

## 2011-12-13 ENCOUNTER — Encounter: Payer: Self-pay | Admitting: Family

## 2011-12-13 ENCOUNTER — Ambulatory Visit (INDEPENDENT_AMBULATORY_CARE_PROVIDER_SITE_OTHER): Payer: 59 | Admitting: Family

## 2011-12-13 VITALS — BP 104/74 | Temp 98.5°F | Wt 165.0 lb

## 2011-12-13 DIAGNOSIS — R05 Cough: Secondary | ICD-10-CM

## 2011-12-13 DIAGNOSIS — J209 Acute bronchitis, unspecified: Secondary | ICD-10-CM

## 2011-12-13 DIAGNOSIS — R059 Cough, unspecified: Secondary | ICD-10-CM

## 2011-12-13 MED ORDER — PREDNISONE 20 MG PO TABS: ORAL_TABLET | ORAL | Status: AC

## 2011-12-13 MED ORDER — AZITHROMYCIN 250 MG PO TABS: 250.0000 mg | ORAL_TABLET | Freq: Every day | ORAL | Status: AC

## 2011-12-13 NOTE — Patient Instructions (Signed)

## 2011-12-13 NOTE — Progress Notes (Signed)
Subjective:    Patient ID: Christina Cowan, female    DOB: May 02, 1961, 51 y.o.   MRN: [id number]  HPI 51 year old white female, nonsmoker then with complaints of cough that's productive of clear drainage, wheezing , and congestion x4 days. She has been taken over-the-counter Advil cold and sinus with no relief. Denies lightheadedness, dizziness, chest pain, palpitations, or edema.   Review of Systems  Constitutional: Positive for fatigue.  HENT: Positive for congestion and postnasal drip.   Respiratory: Positive for cough and wheezing.   Musculoskeletal: Negative.   Skin: Negative.   Neurological: Negative.   Hematological: Negative.   Psychiatric/Behavioral: Negative.       Past Medical History  Diagnosis Date  . Asthma   . GERD (gastroesophageal reflux disease)   . Anxiety   . Arthritis     History   Social History  . Marital Status: Widowed    Spouse Name: N/A    Number of Children: N/A  . Years of Education: N/A   Occupational History  . Not on file.   Social History Main Topics  . Smoking status: Current Everyday Smoker -- 0.8 packs/day    Types: Cigarettes  . Smokeless tobacco: Never Used  . Alcohol Use: 2.4 oz/week    4 Glasses of wine per week     occ  . Drug Use: Yes    Special: Hydrocodone  . Sexually Active: Yes    Birth Control/ Protection: IUD   Other Topics Concern  . Not on file   Social History Narrative  . No narrative on file    Past Surgical History  Procedure Date  . Cesarean section     Family History  Problem Relation Age of Onset  . Arthritis Mother   . Hypertension Mother   . Arthritis Father   . Colon cancer Paternal Uncle     No Known Allergies  Current Outpatient Prescriptions on File Prior to Visit  Medication Sig Dispense Refill  . albuterol (PROVENTIL) (2.5 MG/3ML) 0.083% nebulizer solution Take 3 mLs (2.5 mg total) by nebulization every 6 (six) hours as needed.  75 mL  5  . albuterol (PROVENTIL,VENTOLIN) 90 MCG/ACT  inhaler Inhale 2 puffs into the lungs every 6 (six) hours as needed for wheezing.  17 g  1  . ALPRAZolam (XANAX) 0.5 MG tablet Take 1 tablet (0.5 mg total) by mouth 3 (three) times daily as needed for anxiety. For panic attacks. To be used sparingly  90 tablet  2  . amphetamine-dextroamphetamine (ADDERALL) 20 MG tablet Take 1 tablet (20 mg total) by mouth 3 (three) times daily.  90 tablet  0  . carisoprodol (SOMA) 350 MG tablet Take 350 mg by mouth 3 (three) times daily as needed. For spasms       . diazepam (VALIUM) 5 MG tablet Take 5 mg by mouth 2 (two) times daily as needed. For anxiety, insomnia, and grief       . diclofenac (VOLTAREN) 75 MG EC tablet Take 1 tablet (75 mg total) by mouth 2 (two) times daily with a meal.  60 tablet  11  . ibuprofen (ADVIL,MOTRIN) 600 MG tablet Take 600 mg by mouth 3 (three) times daily with meals. Prn for pain       . losartan-hydrochlorothiazide (HYZAAR) 100-25 MG per tablet Take 1 tablet by mouth daily.  30 tablet  11  . oxyCODONE-acetaminophen (PERCOCET) 5-325 MG per tablet       . senna (SENOKOT) 8.6 MG tablet Take 1 tablet  by mouth 3 (three) times daily.  90 tablet  4  . sennosides-docusate sodium (SENOKOT-S) 8.6-50 MG tablet Take 1 tablet by mouth as needed.        . venlafaxine (EFFEXOR-XR) 150 MG 24 hr capsule Take 1 capsule (150 mg total) by mouth 2 (two) times daily.  60 capsule  4  . VENTOLIN HFA 108 (90 BASE) MCG/ACT inhaler 2 INHALATIONS THREE TIMES A DAY AS NEEDED  18 g  3  . amphetamine-dextroamphetamine (ADDERALL XR, 30MG ,) 30 MG 24 hr capsule Take 1 capsule (30 mg total) by mouth every morning.  30 capsule  0  . amphetamine-dextroamphetamine (ADDERALL XR, 30MG ,) 30 MG 24 hr capsule Take 1 capsule (30 mg total) by mouth every morning.  30 capsule  0  . amphetamine-dextroamphetamine (ADDERALL) 20 MG tablet Take 1 tablet (20 mg total) by mouth 3 (three) times daily. Do not fill before Sept. 21, 2012  90 tablet  0    BP 104/74  Temp(Src) 98.5 F  (36.9 C) (Oral)  Wt 165 lb (74.844 kg)chart  Objective:   Physical Exam  Constitutional: She is oriented to person, place, and time. She appears well-developed and well-nourished.  HENT:  Right Ear: External ear normal.  Left Ear: External ear normal.  Nose: Nose normal.  Mouth/Throat: Oropharynx is clear and moist.  Neck: Normal range of motion. Neck supple.  Cardiovascular: Normal rate, regular rhythm and normal heart sounds.   Pulmonary/Chest: Effort normal. She has wheezes.  Abdominal: Soft. Bowel sounds are normal.  Musculoskeletal: Normal range of motion.  Neurological: She is alert and oriented to person, place, and time.  Skin: Skin is warm and dry.  Psychiatric: She has a normal mood and affect.          Assessment & Plan:  Assessment: Acute bronchitis, cough, wheezing  Plan: Prednisone 60x3, 40x3, 20x3. Z-Pak as directed. Patient should call the office if her symptoms worsen or persist. Recheck as scheduled, and when necessary.

## 2011-12-15 ENCOUNTER — Telehealth: Payer: Self-pay | Admitting: *Deleted

## 2011-12-15 MED ORDER — HYDROCOD POLST-CHLORPHEN POLST 10-8 MG/5ML PO LQCR: 5.0000 mL | Freq: Two times a day (BID) | ORAL | Status: DC | PRN

## 2011-12-15 NOTE — Telephone Encounter (Signed)
Sent to the pharmacy

## 2011-12-15 NOTE — Telephone Encounter (Signed)
Pt is taking her antibiotic and steroids, but needs RX for productive cough, please.  No fever.  Chest is sore from so much coughing.

## 2011-12-16 ENCOUNTER — Telehealth: Payer: Self-pay | Admitting: Family Medicine

## 2011-12-16 NOTE — Telephone Encounter (Signed)
[facility] Triage Call Report Triage Record Num: [id number] Operator: Estevan Oaks Patient Name: Christina Cowan Call Date & Time: 12/15/2011 6:27:42PM Patient Phone: [phone] PCP: Patient Gender: Female PCP Fax : Patient DOB: 1961-05-09 Practice Name: [facility] Reason for Call: Caller: [name]/Patient; PCP: Other; CB#: [phone]; Call regarding Medication Issue: cough med not received at pharm today. Pt seen on 4/16, dx'd with Bronchitis and sinusitis. Called back today because of persistent cough. Was adv'd Rx would [facility] [address] but they've not received. Rn reviewed chart and found order for chlorpheniramine-HYDROcodone (TUSSIONEX PENNKINETIC ER) 10-8 MG/5ML Dose: 5 mL Route: Oral Frequency: Every 12 hours PRN; # 140 mL, no RFs. per Orvan Falconer, Np Rn speaks with Robin, Rph at above pharm [phone]. She confirms Rx not received. Rn gave verbal order and notified pt. Protocol(s) Used: Office Note Recommended Outcome per Protocol: Information Noted and Sent to Office Reason for Outcome: Caller information to office Care Advice: ~

## 2011-12-23 ENCOUNTER — Telehealth: Payer: Self-pay

## 2011-12-23 NOTE — Telephone Encounter (Signed)
Last OV 12/13/2011. Med d/c at 09/08/2011 OV. NORCO 5-325

## 2011-12-23 NOTE — Telephone Encounter (Signed)
Pharmacy advised  

## 2011-12-23 NOTE — Telephone Encounter (Signed)
Med discontinued. She is suppose to be on Percocet

## 2011-12-27 ENCOUNTER — Telehealth: Payer: Self-pay | Admitting: Family

## 2011-12-27 NOTE — Telephone Encounter (Signed)
Patient called stating that she need a refill of her hydrocodone. Please assist.  °

## 2011-12-27 NOTE — Telephone Encounter (Signed)
Patient needs an OV. We discontinued Hydrocodone because she wanted Percocet. She cannot take both.

## 2011-12-27 NOTE — Telephone Encounter (Signed)
Pt advised and denied receiving Rx for Percocet. Pt agreed to schedule appt to further discuss pain management and was transferred to [facility] main number for appt.

## 2011-12-28 ENCOUNTER — Ambulatory Visit: Payer: 59 | Admitting: Family

## 2011-12-28 NOTE — Telephone Encounter (Signed)
Needs OV.  

## 2011-12-29 ENCOUNTER — Ambulatory Visit (INDEPENDENT_AMBULATORY_CARE_PROVIDER_SITE_OTHER): Payer: 59 | Admitting: Family

## 2011-12-29 ENCOUNTER — Encounter: Payer: Self-pay | Admitting: Family

## 2011-12-29 VITALS — BP 144/84 | HR 101 | Temp 98.8°F | Wt 171.0 lb

## 2011-12-29 DIAGNOSIS — F988 Other specified behavioral and emotional disorders with onset usually occurring in childhood and adolescence: Secondary | ICD-10-CM

## 2011-12-29 MED ORDER — HYDROCODONE-ACETAMINOPHEN 5-325 MG PO TABS: 1.0000 | ORAL_TABLET | Freq: Two times a day (BID) | ORAL | Status: DC | PRN

## 2011-12-29 MED ORDER — AMPHETAMINE-DEXTROAMPHETAMINE 20 MG PO TABS: 20.0000 mg | ORAL_TABLET | Freq: Three times a day (TID) | ORAL | Status: DC

## 2011-12-29 NOTE — Progress Notes (Signed)
Subjective:    Patient ID: [name], female    DOB: 01-Oct-1960, 51 y.o.   MRN: [id number]  HPI 51 year old WF, is in for a recheck of ADD and low back pain. Patient is tolerating her current medications well. Her focus and concentration are stable. Back pain is stable. Patient is requesting refills.    Review of Systems  Constitutional: Negative.   Respiratory: Negative.   Cardiovascular: Negative.   Gastrointestinal: Negative.   Musculoskeletal: Positive for back pain.  Skin: Negative.   Neurological: Negative.   Hematological: Negative.   Psychiatric/Behavioral: Negative.    Past Medical History  Diagnosis Date  . Asthma   . GERD (gastroesophageal reflux disease)   . Anxiety   . Arthritis     History   Social History  . Marital Status: Widowed    Spouse Name: N/A    Number of Children: N/A  . Years of Education: N/A   Occupational History  . Not on file.   Social History Main Topics  . Smoking status: Current Everyday Smoker -- 0.8 packs/day    Types: Cigarettes  . Smokeless tobacco: Never Used  . Alcohol Use: 2.4 oz/week    4 Glasses of wine per week     occ  . Drug Use: Yes    Special: Hydrocodone  . Sexually Active: Yes    Birth Control/ Protection: IUD   Other Topics Concern  . Not on file   Social History Narrative  . No narrative on file    Past Surgical History  Procedure Date  . Cesarean section     Family History  Problem Relation Age of Onset  . Arthritis Mother   . Hypertension Mother   . Arthritis Father   . Colon cancer Paternal Uncle     No Known Allergies  Current Outpatient Prescriptions on File Prior to Visit  Medication Sig Dispense Refill  . albuterol (PROVENTIL,VENTOLIN) 90 MCG/ACT inhaler Inhale 2 puffs into the lungs every 6 (six) hours as needed for wheezing.  17 g  1  . carisoprodol (SOMA) 350 MG tablet Take 350 mg by mouth 3 (three) times daily as needed. For spasms       . losartan-hydrochlorothiazide (HYZAAR)  100-25 MG per tablet Take 1 tablet by mouth daily.  30 tablet  11  . senna (SENOKOT) 8.6 MG tablet Take 1 tablet by mouth 3 (three) times daily.  90 tablet  4  . venlafaxine (EFFEXOR-XR) 150 MG 24 hr capsule Take 1 capsule (150 mg total) by mouth 2 (two) times daily.  60 capsule  4  . DISCONTD: amphetamine-dextroamphetamine (ADDERALL) 20 MG tablet Take 1 tablet (20 mg total) by mouth 3 (three) times daily.  90 tablet  0  . DISCONTD: amphetamine-dextroamphetamine (ADDERALL XR, 30MG ,) 30 MG 24 hr capsule Take 1 capsule (30 mg total) by mouth every morning.  30 capsule  0  . DISCONTD: amphetamine-dextroamphetamine (ADDERALL XR, 30MG ,) 30 MG 24 hr capsule Take 1 capsule (30 mg total) by mouth every morning.  30 capsule  0  . DISCONTD: amphetamine-dextroamphetamine (ADDERALL) 20 MG tablet Take 1 tablet (20 mg total) by mouth 3 (three) times daily. Do not fill before Sept. 21, 2012  90 tablet  0    BP 144/84  Pulse 101  Temp(Src) 98.8 F (37.1 C) (Oral)  Wt 171 lb (77.565 kg)chart    Objective:   Physical Exam  Constitutional: She is oriented to person, place, and time. She appears well-developed and well-nourished.  Neck: Normal range of motion. Neck supple.  Cardiovascular: Normal rate, regular rhythm and normal heart sounds.   Pulmonary/Chest: Effort normal and breath sounds normal.  Abdominal: Soft. Bowel sounds are normal.  Musculoskeletal: Normal range of motion.  Neurological: She is alert and oriented to person, place, and time.  Skin: Skin is warm and dry.  Psychiatric: She has a normal mood and affect.          Assessment & Plan:  Assessment: Low Back Pain and ADD  Plan: Refilled Adderall and Hydrocodone. Recheck in 3 months a sooner PRN.

## 2012-01-10 ENCOUNTER — Other Ambulatory Visit: Payer: Self-pay

## 2012-01-10 DIAGNOSIS — I10 Essential (primary) hypertension: Secondary | ICD-10-CM

## 2012-01-10 MED ORDER — VENLAFAXINE HCL ER 150 MG PO CP24: 150.0000 mg | ORAL_CAPSULE | Freq: Two times a day (BID) | ORAL | Status: DC

## 2012-01-10 MED ORDER — LOSARTAN POTASSIUM-HCTZ 100-25 MG PO TABS: 1.0000 | ORAL_TABLET | Freq: Every day | ORAL | Status: DC

## 2012-01-10 NOTE — Telephone Encounter (Signed)
Per pharmacist, Rosanne Ashing, insurance company requests that 90 supplies be filled. Rx for Effexor and Losartan changed to 90 day supplies

## 2012-01-29 IMAGING — CR DG CHEST 2V
2 series · 2 of 2 positions shown · non-contrast
Comparison: None.

CLINICAL DATA: Chest pain

CHEST - 2 VIEW

[w chest pa]
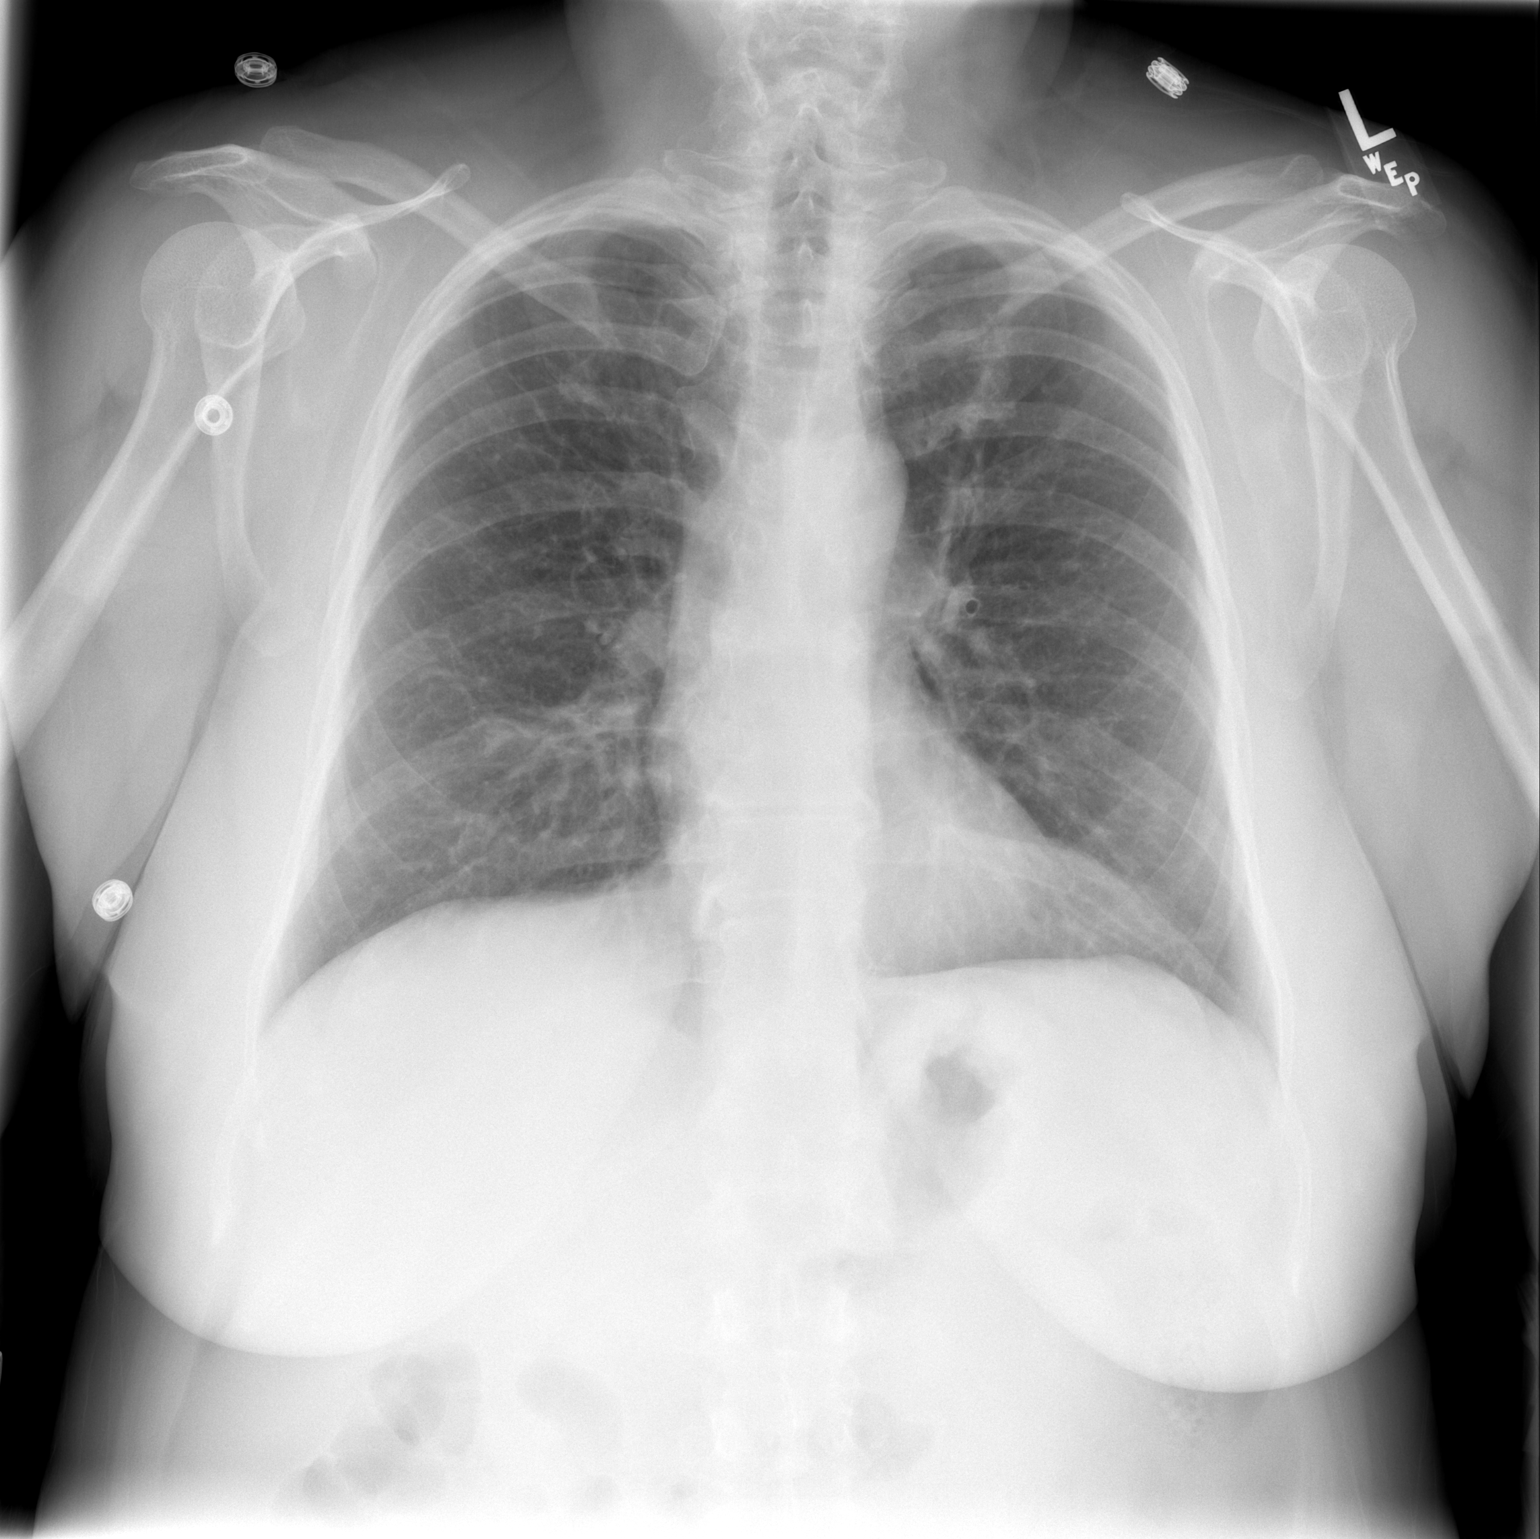

[w chest lat]
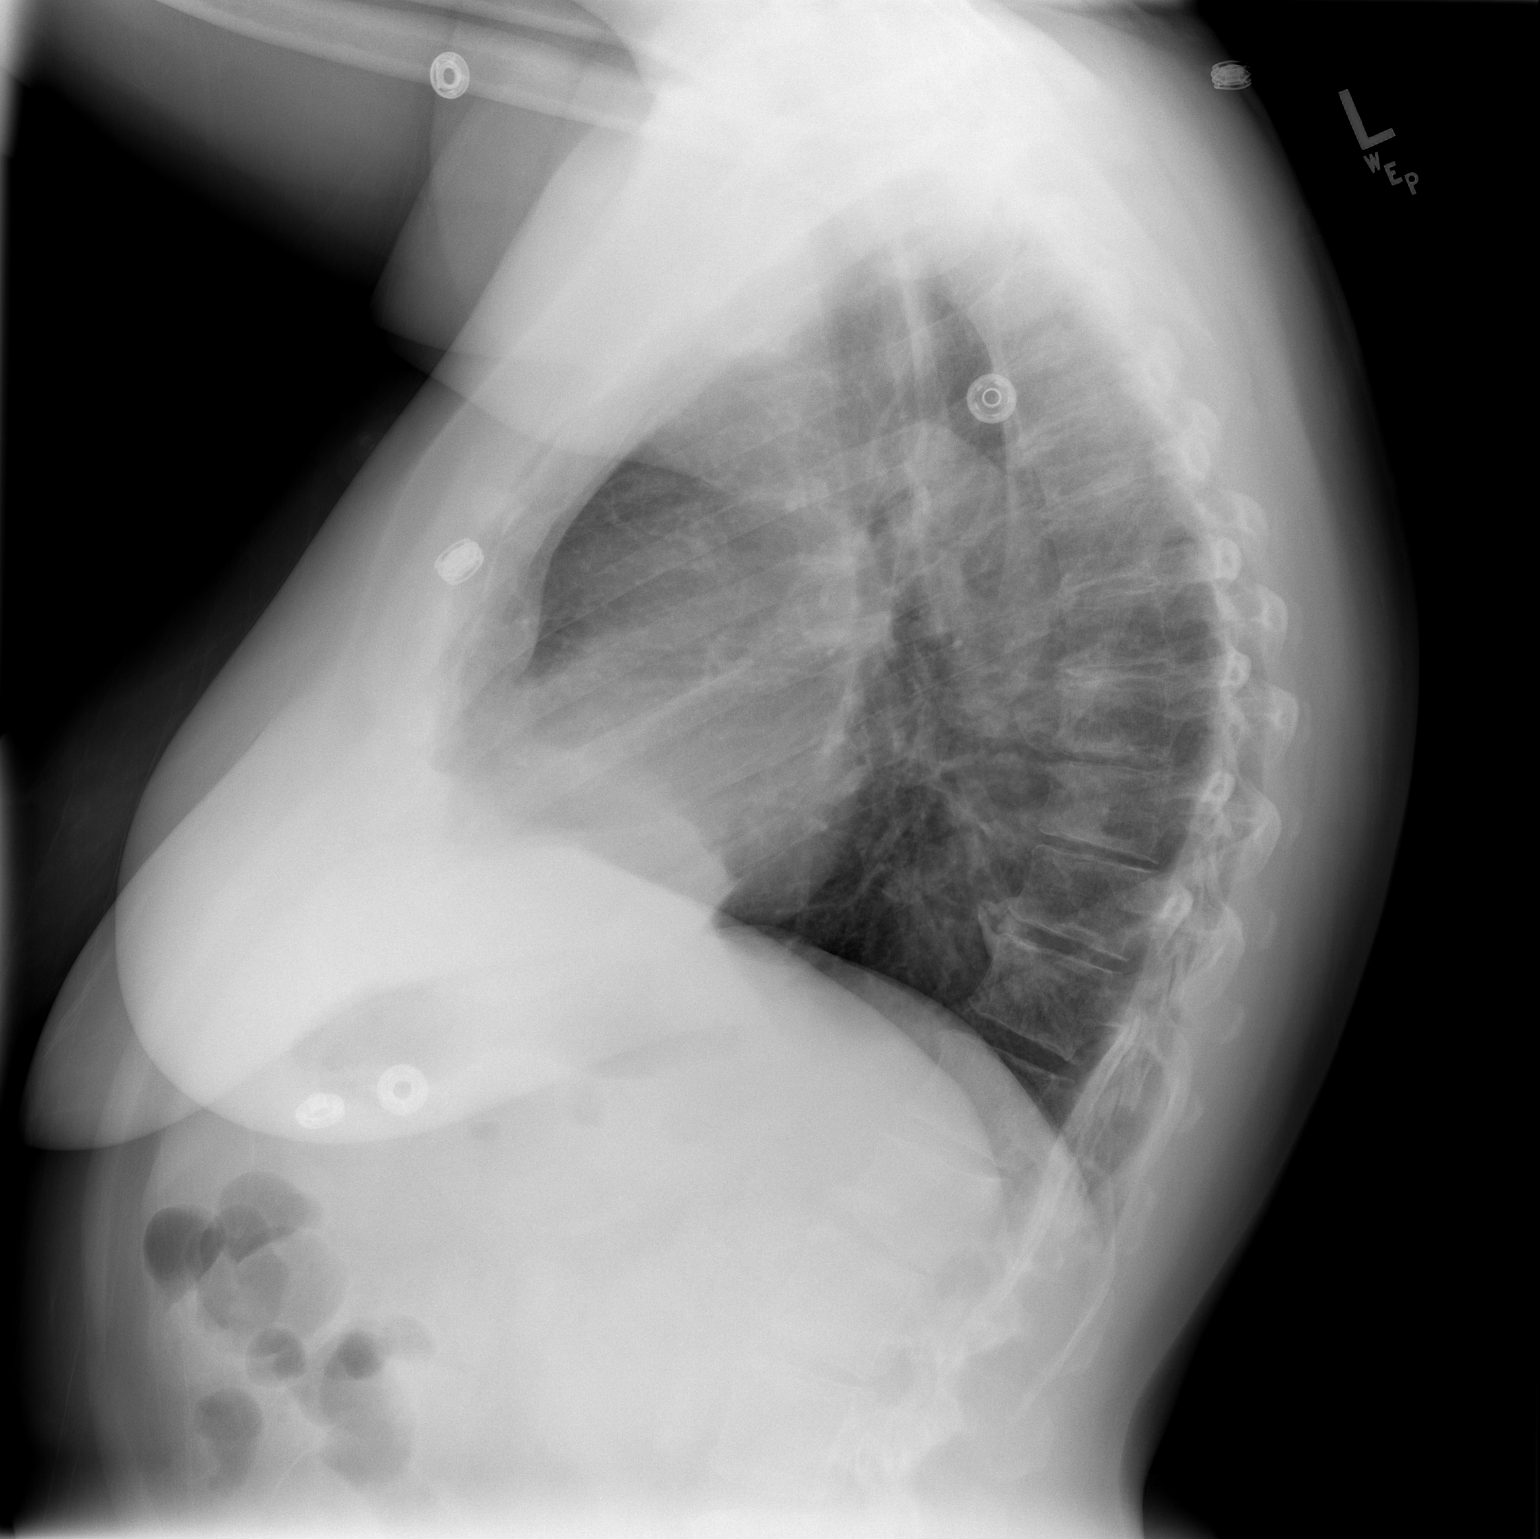

[2 of 2 positions shown; findings below may reference images not displayed]

FINDINGS: Heart and mediastinal contours normal.  There is central
airway thickening with mildly accentuated lung markings.  No
consolidation or pleural fluid.  Osseous structures intact with
mild degenerative changes of the T-spine.
IMPRESSION: Chronic changes as above.  No acute findings.

## 2012-03-28 ENCOUNTER — Other Ambulatory Visit: Payer: Self-pay | Admitting: Family

## 2012-03-28 NOTE — Telephone Encounter (Signed)
Pt would like  [phone]. [facility] stated [facility] [address] said can not [facility] due to control medication

## 2012-03-30 MED ORDER — HYDROCODONE-ACETAMINOPHEN 5-325 MG PO TABS: 1.0000 | ORAL_TABLET | Freq: Two times a day (BID) | ORAL | Status: DC | PRN

## 2012-03-30 NOTE — Addendum Note (Signed)
Addended by: Beverely Low on: 03/30/2012 03:59 PM   Modules accepted: Orders

## 2012-04-12 ENCOUNTER — Ambulatory Visit (INDEPENDENT_AMBULATORY_CARE_PROVIDER_SITE_OTHER): Payer: 59 | Admitting: Family

## 2012-04-12 ENCOUNTER — Encounter: Payer: Self-pay | Admitting: Family

## 2012-04-12 VITALS — BP 170/110 | HR 91 | Temp 98.2°F | Wt 169.0 lb

## 2012-04-12 DIAGNOSIS — I10 Essential (primary) hypertension: Secondary | ICD-10-CM

## 2012-04-12 DIAGNOSIS — F3289 Other specified depressive episodes: Secondary | ICD-10-CM

## 2012-04-12 DIAGNOSIS — F32A Depression, unspecified: Secondary | ICD-10-CM

## 2012-04-12 DIAGNOSIS — F988 Other specified behavioral and emotional disorders with onset usually occurring in childhood and adolescence: Secondary | ICD-10-CM

## 2012-04-12 DIAGNOSIS — F329 Major depressive disorder, single episode, unspecified: Secondary | ICD-10-CM

## 2012-04-12 LAB — BASIC METABOLIC PANEL
Chloride: 104 mEq/L (ref 96–112)
Potassium: 4.5 mEq/L (ref 3.5–5.1)

## 2012-04-12 MED ORDER — AMPHETAMINE-DEXTROAMPHETAMINE 20 MG PO TABS: 20.0000 mg | ORAL_TABLET | Freq: Three times a day (TID) | ORAL | Status: DC

## 2012-04-12 NOTE — Progress Notes (Signed)
Subjective:    Patient ID: Christina Cowan, female    DOB: [birth year], 51 y.o.   MRN: [id number]  HPI 51 year old white female, is in for recheck of attention deficit disorder, hypertension, depression. She's doing well on all her medications. Denies any concerns today. Needs refills. She has been off of her blood pressure medicine x 3 days but has more refills.    Review of Systems  Constitutional: Negative.   Respiratory: Negative.   Cardiovascular: Negative.   Musculoskeletal: Negative.   Skin: Negative.   Hematological: Negative.   Psychiatric/Behavioral: Negative.        Past Medical History  Diagnosis Date  . Asthma   . GERD (gastroesophageal reflux disease)   . Anxiety   . Arthritis     History   Social History  . Marital Status: Widowed    Spouse Name: N/A    Number of Children: N/A  . Years of Education: N/A   Occupational History  . Not on file.   Social History Main Topics  . Smoking status: Current Everyday Smoker -- 0.8 packs/day    Types: Cigarettes  . Smokeless tobacco: Never Used  . Alcohol Use: 2.4 oz/week    4 Glasses of wine per week     occ  . Drug Use: Yes    Special: Hydrocodone  . Sexually Active: Yes    Birth Control/ Protection: IUD   Other Topics Concern  . Not on file   Social History Narrative  . No narrative on file    Past Surgical History  Procedure Date  . Cesarean section     Family History  Problem Relation Age of Onset  . Arthritis Mother   . Hypertension Mother   . Arthritis Father   . Colon cancer Paternal Uncle     No Known Allergies  Current Outpatient Prescriptions on File Prior to Visit  Medication Sig Dispense Refill  . albuterol (PROVENTIL,VENTOLIN) 90 MCG/ACT inhaler Inhale 2 puffs into the lungs every 6 (six) hours as needed for wheezing.  17 g  1  . carisoprodol (SOMA) 350 MG tablet Take 350 mg by mouth 3 (three) times daily as needed. For spasms       . HYDROcodone-acetaminophen (NORCO/VICODIN) 5-325  MG per tablet Take 1 tablet by mouth 2 (two) times daily as needed.  60 tablet  0  . losartan-hydrochlorothiazide (HYZAAR) 100-25 MG per tablet Take 1 tablet by mouth daily.  90 tablet  2  . senna (SENOKOT) 8.6 MG tablet Take 1 tablet by mouth 3 (three) times daily.  90 tablet  4  . venlafaxine XR (EFFEXOR-XR) 150 MG 24 hr capsule Take 1 capsule (150 mg total) by mouth 2 (two) times daily.  180 capsule  1  . DISCONTD: amphetamine-dextroamphetamine (ADDERALL) 20 MG tablet Take 1 tablet (20 mg total) by mouth 3 (three) times daily.  90 tablet  0    BP 170/110  Pulse 91  Temp 98.2 F (36.8 C) (Oral)  Wt 169 lb (76.658 kg)  SpO2 98%chart Objective:   Physical Exam  Constitutional: She is oriented to person, place, and time. She appears well-developed and well-nourished.  Neck: Normal range of motion. Neck supple.  Cardiovascular: Normal rate, regular rhythm and normal heart sounds.   Pulmonary/Chest: Effort normal and breath sounds normal.  Abdominal: Soft. Bowel sounds are normal.  Neurological: She is alert and oriented to person, place, and time.  Skin: Skin is warm and dry.  Psychiatric: She has a normal mood and  affect.   BP-164/100       Assessment & Plan:  Assessment: Attention deficit disorder, hypertension, depression  Plan: Adderall refill x3 months. BMP sent. Advised patient to take her blood pressure medication every day. Encouraged healthy diet and exercise. At her next recheck, she will have a complete physical exam.

## 2012-04-19 NOTE — Progress Notes (Signed)
Left message on voicemail for the pt to return my call. 

## 2012-04-26 ENCOUNTER — Telehealth: Payer: Self-pay

## 2012-04-26 MED ORDER — HYDROCODONE-ACETAMINOPHEN 5-325 MG PO TABS: 1.0000 | ORAL_TABLET | Freq: Two times a day (BID) | ORAL | Status: DC | PRN

## 2012-04-26 NOTE — Telephone Encounter (Signed)
Spoke with pharmacy and pt does not have refills left. Filled #60 with 2 refills

## 2012-07-05 ENCOUNTER — Other Ambulatory Visit: Payer: 59

## 2012-07-13 ENCOUNTER — Ambulatory Visit (INDEPENDENT_AMBULATORY_CARE_PROVIDER_SITE_OTHER): Payer: Self-pay | Admitting: Family

## 2012-07-13 ENCOUNTER — Encounter: Payer: Self-pay | Admitting: Family

## 2012-07-13 ENCOUNTER — Other Ambulatory Visit (HOSPITAL_COMMUNITY)
Admission: RE | Admit: 2012-07-13 | Discharge: 2012-07-13 | Disposition: A | Discharge status: Home / Self Care | Payer: Self-pay | Source: Ambulatory Visit | Attending: Family | Admitting: Family

## 2012-07-13 VITALS — BP 160/96 | HR 85 | Temp 97.8°F | Ht 63.75 in | Wt 171.0 lb

## 2012-07-13 DIAGNOSIS — Z01419 Encounter for gynecological examination (general) (routine) without abnormal findings: Secondary | ICD-10-CM | POA: Insufficient documentation

## 2012-07-13 DIAGNOSIS — Z1231 Encounter for screening mammogram for malignant neoplasm of breast: Secondary | ICD-10-CM

## 2012-07-13 DIAGNOSIS — Z124 Encounter for screening for malignant neoplasm of cervix: Secondary | ICD-10-CM

## 2012-07-13 DIAGNOSIS — F988 Other specified behavioral and emotional disorders with onset usually occurring in childhood and adolescence: Secondary | ICD-10-CM

## 2012-07-13 DIAGNOSIS — Z0000 Encounter for general adult medical examination without abnormal findings: Secondary | ICD-10-CM

## 2012-07-13 DIAGNOSIS — K219 Gastro-esophageal reflux disease without esophagitis: Secondary | ICD-10-CM

## 2012-07-13 LAB — CBC WITH DIFFERENTIAL/PLATELET
Eosinophils Relative: 1.2 % (ref 0.0–5.0)
HCT: 40.9 % (ref 36.0–46.0)
Hemoglobin: 13.6 g/dL (ref 12.0–15.0)
Lymphs Abs: 2.4 10*3/uL (ref 0.7–4.0)
Monocytes Relative: 5.4 % (ref 3.0–12.0)
Neutro Abs: 3.7 10*3/uL (ref 1.4–7.7)
WBC: 6.6 10*3/uL (ref 4.5–10.5)

## 2012-07-13 LAB — LIPID PANEL
HDL: 51.1 mg/dL (ref >39.00)
Triglycerides: 282 mg/dL — ABNORMAL HIGH (ref 0.0–149.0)

## 2012-07-13 LAB — COMPREHENSIVE METABOLIC PANEL
CO2: 27 mEq/L (ref 19–32)
Creatinine, Ser: 1 mg/dL (ref 0.4–1.2)
GFR: 60.31 mL/min (ref >60.00)
Glucose, Bld: 85 mg/dL (ref 70–99)
Total Bilirubin: 0.6 mg/dL (ref 0.3–1.2)

## 2012-07-13 LAB — TSH: TSH: 1.31 u[IU]/mL (ref 0.35–5.50)

## 2012-07-13 MED ORDER — HYDROCODONE-ACETAMINOPHEN 5-325 MG PO TABS: 1.0000 | ORAL_TABLET | Freq: Two times a day (BID) | ORAL | Status: DC | PRN

## 2012-07-13 MED ORDER — AMPHETAMINE-DEXTROAMPHETAMINE 20 MG PO TABS: 20.0000 mg | ORAL_TABLET | Freq: Three times a day (TID) | ORAL | Status: DC

## 2012-07-13 MED ORDER — VENLAFAXINE HCL ER 150 MG PO CP24: 150.0000 mg | ORAL_CAPSULE | Freq: Two times a day (BID) | ORAL | Status: DC

## 2012-07-13 NOTE — Progress Notes (Signed)
Subjective:    Patient ID: Christina Cowan, female    DOB: 1960/10/28, 51 y.o.   MRN: [id number]  HPI 51 year old, married non smoking female seen for routine annual exam to include pap smear. Pt has no complaints states that chronic back and knee pain management with current use of vicodin. Reports she did not have a colonoscopy last year and last mammogram was approximately 4 years ago.  Pt reports ADHD is adequately controlled with current adderall regime.      Review of Systems  Constitutional: Negative.   HENT: Negative.   Eyes: Negative.   Respiratory: Negative.   Cardiovascular: Negative.   Gastrointestinal: Negative.   Genitourinary: Negative.   Musculoskeletal: Negative.   Skin: Negative.   Neurological: Negative.   Hematological: Negative.   Psychiatric/Behavioral: Negative.    Past Medical History  Diagnosis Date  . Asthma   . GERD (gastroesophageal reflux disease)   . Anxiety   . Arthritis     History   Social History  . Marital Status: Widowed    Spouse Name: N/A    Number of Children: N/A  . Years of Education: N/A   Occupational History  . Not on file.   Social History Main Topics  . Smoking status: Current Every Day Smoker -- 0.8 packs/day    Types: Cigarettes  . Smokeless tobacco: Never Used  . Alcohol Use: 2.4 oz/week    4 Glasses of wine per week     Comment: occ  . Drug Use: Yes    Special: Hydrocodone  . Sexually Active: Yes    Birth Control/ Protection: IUD   Other Topics Concern  . Not on file   Social History Narrative  . No narrative on file    Past Surgical History  Procedure Date  . Cesarean section     Family History  Problem Relation Age of Onset  . Arthritis Mother   . Hypertension Mother   . Arthritis Father   . Colon cancer Paternal Uncle     No Known Allergies  Current Outpatient Prescriptions on File Prior to Visit  Medication Sig Dispense Refill  . albuterol (PROVENTIL,VENTOLIN) 90 MCG/ACT inhaler Inhale 2  puffs into the lungs every 6 (six) hours as needed for wheezing.  17 g  1  . losartan-hydrochlorothiazide (HYZAAR) 100-25 MG per tablet Take 1 tablet by mouth daily.  90 tablet  2  . [DISCONTINUED] amphetamine-dextroamphetamine (ADDERALL) 20 MG tablet Take 1 tablet (20 mg total) by mouth 3 (three) times daily.  90 tablet  0  . [DISCONTINUED] venlafaxine XR (EFFEXOR-XR) 150 MG 24 hr capsule Take 1 capsule (150 mg total) by mouth 2 (two) times daily.  180 capsule  1  . carisoprodol (SOMA) 350 MG tablet Take 350 mg by mouth 3 (three) times daily as needed. For spasms       . senna (SENOKOT) 8.6 MG tablet Take 1 tablet by mouth 3 (three) times daily.  90 tablet  4    BP 160/96  Pulse 85  Temp 97.8 F (36.6 C) (Oral)  Ht 5' 3.75" (1.619 m)  Wt 171 lb (77.565 kg)  BMI 29.58 kg/m2  SpO2 98%chart     Objective:   Physical Exam  Constitutional: She is oriented to person, place, and time. She appears well-developed and well-nourished.  HENT:  Head: Normocephalic and atraumatic.  Right Ear: External ear normal.  Left Ear: External ear normal.  Eyes: Pupils are equal, round, and reactive to light.  Neck:  Normal range of motion. Neck supple.  Cardiovascular: Normal rate, regular rhythm, normal heart sounds and intact distal pulses.   Pulmonary/Chest: Effort normal and breath sounds normal.  Abdominal: Soft. Bowel sounds are normal.  Genitourinary: Vagina normal. Guaiac negative stool. No vaginal discharge found.  Musculoskeletal: She exhibits no edema and no tenderness.  Neurological: She is alert and oriented to person, place, and time. No cranial nerve deficit. Coordination normal.  Skin: Skin is warm and dry.  Psychiatric: She has a normal mood and affect. Her behavior is normal. Judgment and thought content normal.          Assessment & Plan:   Assessment: CPX, ADHD, GERD, Chronic back pain  Plan: Refill of current prescription: Vicodin and Adderall  Cervical Cancer  Screening Influenza vaccine CBC, CMP, TSH Referral for colonoscopy and mammogram Follow up in 3 months for repeat of labs or sooner if needed.

## 2012-07-13 NOTE — Patient Instructions (Addendum)
The patient is instructed to continue all medications as prescribed. Schedule followup with check out clerk upon leaving the clinic  

## 2012-07-14 ENCOUNTER — Other Ambulatory Visit: Payer: Self-pay | Admitting: Family

## 2012-07-16 ENCOUNTER — Other Ambulatory Visit: Payer: Self-pay | Admitting: Family

## 2012-07-16 MED ORDER — ATORVASTATIN CALCIUM 20 MG PO TABS: 20.0000 mg | ORAL_TABLET | Freq: Every day | ORAL | Status: DC

## 2012-08-03 ENCOUNTER — Telehealth: Payer: Self-pay | Admitting: Family

## 2012-08-03 NOTE — Telephone Encounter (Signed)
XR Adderall will not be cheaper. Nor would that dose be equivalent.

## 2012-08-03 NOTE — Telephone Encounter (Signed)
Patient is awar

## 2012-08-03 NOTE — Telephone Encounter (Signed)
Opened in error

## 2012-08-03 NOTE — Telephone Encounter (Signed)
Patient calling for adderall Rx.  States it is time for new Rx.  States she must "shop around for the best deal," as she has no health insurance.  States has been taking Adderall 20mg  plain TID, and would like to have Ms. Orvan Falconer consider switching to Adderall XR 30mg  BID, as she thinks this will be cheaper.  INfo to office for provider review/Rx/callback.  May reach patient at [phone].krs/[email or web address]

## 2012-08-06 ENCOUNTER — Telehealth: Payer: Self-pay | Admitting: Family

## 2012-08-06 DIAGNOSIS — F988 Other specified behavioral and emotional disorders with onset usually occurring in childhood and adolescence: Secondary | ICD-10-CM

## 2012-08-06 MED ORDER — AMPHETAMINE-DEXTROAMPHETAMINE 20 MG PO TABS: 20.0000 mg | ORAL_TABLET | Freq: Three times a day (TID) | ORAL | Status: DC

## 2012-08-06 NOTE — Telephone Encounter (Signed)
Patient called stating that she need a refill of her adderall 20 mg 1potid. Please assist.

## 2012-08-06 NOTE — Telephone Encounter (Signed)
Rx printed

## 2012-08-06 NOTE — Telephone Encounter (Signed)
Left message to advise pt Rx ready for pick up 

## 2012-09-10 ENCOUNTER — Other Ambulatory Visit: Payer: Self-pay | Admitting: Family

## 2012-09-10 DIAGNOSIS — F988 Other specified behavioral and emotional disorders with onset usually occurring in childhood and adolescence: Secondary | ICD-10-CM

## 2012-09-10 MED ORDER — AMPHETAMINE-DEXTROAMPHETAMINE 20 MG PO TABS: 20.0000 mg | ORAL_TABLET | Freq: Three times a day (TID) | ORAL | Status: DC

## 2012-09-10 NOTE — Telephone Encounter (Signed)
Pt needs refill of  amphetamine-dextroamphetamine (ADDERALL) 20 MG tablet.  

## 2012-09-10 NOTE — Telephone Encounter (Signed)
Left message to advise pt Rx ready to pick up 

## 2012-10-03 ENCOUNTER — Other Ambulatory Visit: Payer: Self-pay | Admitting: Family

## 2012-10-04 ENCOUNTER — Other Ambulatory Visit: Payer: Self-pay | Admitting: Family

## 2012-10-15 ENCOUNTER — Telehealth: Payer: Self-pay | Admitting: Family

## 2012-10-15 DIAGNOSIS — F988 Other specified behavioral and emotional disorders with onset usually occurring in childhood and adolescence: Secondary | ICD-10-CM

## 2012-10-15 MED ORDER — AMPHETAMINE-DEXTROAMPHETAMINE 20 MG PO TABS: 20.0000 mg | ORAL_TABLET | Freq: Three times a day (TID) | ORAL | Status: DC

## 2012-10-15 NOTE — Telephone Encounter (Signed)
Pt needs refill of amphetamine-dextroamphetamine (ADDERALL) 20 MG tablet. Pt made follow up appt. 10/29/12

## 2012-10-15 NOTE — Telephone Encounter (Signed)
Rx printed and pt aware 

## 2012-10-29 ENCOUNTER — Ambulatory Visit: Payer: Self-pay | Admitting: Family

## 2012-11-12 ENCOUNTER — Telehealth: Payer: Self-pay | Admitting: Family

## 2012-11-12 DIAGNOSIS — F988 Other specified behavioral and emotional disorders with onset usually occurring in childhood and adolescence: Secondary | ICD-10-CM

## 2012-11-12 NOTE — Telephone Encounter (Signed)
Pt needs new rx generic adderall 20 mg three times a day.

## 2012-11-13 MED ORDER — AMPHETAMINE-DEXTROAMPHETAMINE 20 MG PO TABS: 20.0000 mg | ORAL_TABLET | Freq: Three times a day (TID) | ORAL | Status: DC

## 2012-11-13 NOTE — Telephone Encounter (Signed)
Left message to notify pt Rx ready for pick up

## 2012-12-11 ENCOUNTER — Telehealth: Payer: Self-pay | Admitting: Family

## 2012-12-11 NOTE — Telephone Encounter (Signed)
Pt needs new rx generic adderall 20 mg °

## 2012-12-11 NOTE — Telephone Encounter (Signed)
Denied- Needs OV 

## 2012-12-12 NOTE — Telephone Encounter (Signed)
Pt is sch for 4-17 °

## 2012-12-12 NOTE — Telephone Encounter (Signed)
lmom for pt to makes appt

## 2012-12-13 ENCOUNTER — Ambulatory Visit (INDEPENDENT_AMBULATORY_CARE_PROVIDER_SITE_OTHER): Payer: Self-pay | Admitting: Family

## 2012-12-13 ENCOUNTER — Encounter: Payer: Self-pay | Admitting: Family

## 2012-12-13 VITALS — BP 140/92 | HR 94 | Wt 168.0 lb

## 2012-12-13 DIAGNOSIS — F3289 Other specified depressive episodes: Secondary | ICD-10-CM

## 2012-12-13 DIAGNOSIS — F32A Depression, unspecified: Secondary | ICD-10-CM

## 2012-12-13 DIAGNOSIS — M549 Dorsalgia, unspecified: Secondary | ICD-10-CM

## 2012-12-13 DIAGNOSIS — F988 Other specified behavioral and emotional disorders with onset usually occurring in childhood and adolescence: Secondary | ICD-10-CM

## 2012-12-13 DIAGNOSIS — G8929 Other chronic pain: Secondary | ICD-10-CM

## 2012-12-13 DIAGNOSIS — F329 Major depressive disorder, single episode, unspecified: Secondary | ICD-10-CM

## 2012-12-13 MED ORDER — AMPHETAMINE-DEXTROAMPHETAMINE 20 MG PO TABS: 20.0000 mg | ORAL_TABLET | Freq: Three times a day (TID) | ORAL | Status: DC

## 2012-12-13 MED ORDER — VENLAFAXINE HCL ER 150 MG PO CP24: 150.0000 mg | ORAL_CAPSULE | Freq: Two times a day (BID) | ORAL | Status: DC

## 2012-12-13 MED ORDER — ATORVASTATIN CALCIUM 20 MG PO TABS: 20.0000 mg | ORAL_TABLET | Freq: Every day | ORAL | Status: DC

## 2012-12-13 MED ORDER — HYDROCODONE-ACETAMINOPHEN 5-325 MG PO TABS: 1.0000 | ORAL_TABLET | Freq: Three times a day (TID) | ORAL | Status: DC | PRN

## 2012-12-13 MED ORDER — ALBUTEROL SULFATE HFA 108 (90 BASE) MCG/ACT IN AERS: 2.0000 | INHALATION_SPRAY | Freq: Four times a day (QID) | RESPIRATORY_TRACT | Status: DC | PRN

## 2012-12-13 NOTE — Progress Notes (Signed)
Subjective:    Patient ID: Christina Cowan, female    DOB: 08/21/1961, 52 y.o.   MRN: [id number]  HPI 52 year old white female, nonsmoker is in for recheck of attention deficit disorder, chronic back pain, depression and anxiety. She reports doing well except experience and some sadness taken about her son going away to college. Has noticed increase crying. However, she's also been taken less of Adderall. She denies any feelings of helplessness, hopelessness, thoughts of death or dying.   Review of Systems  Constitutional: Negative.   HENT: Negative.   Cardiovascular: Negative.   Gastrointestinal: Negative.   Genitourinary: Negative.   Musculoskeletal: Negative.   Skin: Negative.   Neurological: Negative.   Hematological: Negative.   Psychiatric/Behavioral: Negative.    Past Medical History  Diagnosis Date  . Asthma   . GERD (gastroesophageal reflux disease)   . Anxiety   . Arthritis     History   Social History  . Marital Status: Widowed    Spouse Name: N/A    Number of Children: N/A  . Years of Education: N/A   Occupational History  . Not on file.   Social History Main Topics  . Smoking status: Current Every Day Smoker -- 0.80 packs/day    Types: Cigarettes  . Smokeless tobacco: Never Used  . Alcohol Use: 2.4 oz/week    4 Glasses of wine per week     Comment: occ  . Drug Use: Yes    Special: Hydrocodone  . Sexually Active: Yes    Birth Control/ Protection: IUD   Other Topics Concern  . Not on file   Social History Narrative  . No narrative on file    Past Surgical History  Procedure Laterality Date  . Cesarean section      Family History  Problem Relation Age of Onset  . Arthritis Mother   . Hypertension Mother   . Arthritis Father   . Colon cancer Paternal Uncle     No Known Allergies  Current Outpatient Prescriptions on File Prior to Visit  Medication Sig Dispense Refill  . carisoprodol (SOMA) 350 MG tablet Take 350 mg by mouth 3 (three) times  daily as needed. For spasms       . losartan-hydrochlorothiazide (HYZAAR) 100-25 MG per tablet TAKE 1 TABLET BY MOUTH DAILY.  90 tablet  2  . senna (SENOKOT) 8.6 MG tablet Take 1 tablet by mouth 3 (three) times daily.  90 tablet  4  . albuterol (PROVENTIL,VENTOLIN) 90 MCG/ACT inhaler Inhale 2 puffs into the lungs every 6 (six) hours as needed for wheezing.  17 g  1   No current facility-administered medications on file prior to visit.    BP 140/92  Pulse 94  Wt 168 lb (76.204 kg)  BMI 29.07 kg/m2  SpO2 98%chart     Objective:   Physical Exam  Constitutional: She is oriented to person, place, and time. She appears well-developed and well-nourished.  Neck: Normal range of motion. Neck supple.  Cardiovascular: Normal rate, regular rhythm and normal heart sounds.   Pulmonary/Chest: Effort normal and breath sounds normal.  Abdominal: Soft. Bowel sounds are normal.  Neurological: She is alert and oriented to person, place, and time.  Skin: Skin is warm and dry.  Psychiatric: She has a normal mood and affect.          Assessment & Plan:  Assessment:  1. ADD 2. depression 2. Chronic back pain  Plan: Continue current medications. Return next month to have IUD removed.

## 2012-12-17 ENCOUNTER — Ambulatory Visit: Payer: Self-pay | Admitting: Family

## 2013-01-09 ENCOUNTER — Telehealth: Payer: Self-pay | Admitting: Family

## 2013-01-09 DIAGNOSIS — F988 Other specified behavioral and emotional disorders with onset usually occurring in childhood and adolescence: Secondary | ICD-10-CM

## 2013-01-09 MED ORDER — AMPHETAMINE-DEXTROAMPHETAMINE 20 MG PO TABS: 20.0000 mg | ORAL_TABLET | Freq: Three times a day (TID) | ORAL | Status: DC

## 2013-01-09 NOTE — Telephone Encounter (Signed)
Patient called stating that she need a refill of her adderall 20 mg 1po tid. Please assist.

## 2013-01-09 NOTE — Telephone Encounter (Signed)
Rx printed and ready for pick up.  

## 2013-02-04 ENCOUNTER — Telehealth: Payer: Self-pay | Admitting: Family

## 2013-02-04 DIAGNOSIS — F988 Other specified behavioral and emotional disorders with onset usually occurring in childhood and adolescence: Secondary | ICD-10-CM

## 2013-02-04 MED ORDER — AMPHETAMINE-DEXTROAMPHETAMINE 20 MG PO TABS: 20.0000 mg | ORAL_TABLET | Freq: Three times a day (TID) | ORAL | Status: DC

## 2013-02-04 NOTE — Telephone Encounter (Signed)
Pt needs new rx generic adderall 20 mg °

## 2013-02-04 NOTE — Telephone Encounter (Signed)
Rx printed and ready for pick up.  

## 2013-02-19 ENCOUNTER — Other Ambulatory Visit: Payer: Self-pay | Admitting: Family

## 2013-02-19 NOTE — Telephone Encounter (Signed)
Last OV and refill date 12/13/12. Ok to fill??

## 2013-02-20 NOTE — Telephone Encounter (Signed)
Pt called back, wondering if and when this med may be refilled. Thank you.

## 2013-03-05 ENCOUNTER — Encounter: Payer: Self-pay | Admitting: Family

## 2013-03-05 ENCOUNTER — Telehealth: Payer: Self-pay | Admitting: Family

## 2013-03-05 DIAGNOSIS — F988 Other specified behavioral and emotional disorders with onset usually occurring in childhood and adolescence: Secondary | ICD-10-CM

## 2013-03-05 MED ORDER — AMPHETAMINE-DEXTROAMPHETAMINE 20 MG PO TABS: 20.0000 mg | ORAL_TABLET | Freq: Three times a day (TID) | ORAL | Status: DC

## 2013-03-05 NOTE — Telephone Encounter (Signed)
Left message to advise pt Rx ready to pick and that she will need to schedule follow up for further refills

## 2013-03-05 NOTE — Telephone Encounter (Signed)
Pt needs refill of amphetamine-dextroamphetamine (ADDERALL) 20 MG tablet. 1 tablet/TID

## 2013-03-29 ENCOUNTER — Telehealth: Payer: Self-pay | Admitting: Family

## 2013-03-29 NOTE — Telephone Encounter (Signed)
Cannot receive refill until she comes in for her appointment

## 2013-03-29 NOTE — Telephone Encounter (Signed)
PT is requesting a 1 month supply of amphetamine-dextroamphetamine (ADDERALL) 20 MG tablet, to last her until her upcoming med fu appt. Please assist.

## 2013-04-08 ENCOUNTER — Ambulatory Visit (INDEPENDENT_AMBULATORY_CARE_PROVIDER_SITE_OTHER): Payer: Self-pay | Admitting: Family

## 2013-04-08 ENCOUNTER — Encounter: Payer: Self-pay | Admitting: Family

## 2013-04-08 VITALS — BP 140/82 | HR 94 | Wt 172.0 lb

## 2013-04-08 DIAGNOSIS — M79671 Pain in right foot: Secondary | ICD-10-CM

## 2013-04-08 DIAGNOSIS — I10 Essential (primary) hypertension: Secondary | ICD-10-CM

## 2013-04-08 DIAGNOSIS — M79609 Pain in unspecified limb: Secondary | ICD-10-CM

## 2013-04-08 DIAGNOSIS — F988 Other specified behavioral and emotional disorders with onset usually occurring in childhood and adolescence: Secondary | ICD-10-CM

## 2013-04-08 DIAGNOSIS — E7800 Pure hypercholesterolemia, unspecified: Secondary | ICD-10-CM

## 2013-04-08 LAB — BASIC METABOLIC PANEL
CO2: 29 mEq/L (ref 19–32)
Calcium: 9.4 mg/dL (ref 8.4–10.5)
Chloride: 100 mEq/L (ref 96–112)
Creatinine, Ser: 0.9 mg/dL (ref 0.4–1.2)
Glucose, Bld: 73 mg/dL (ref 70–99)

## 2013-04-08 LAB — LIPID PANEL
Cholesterol: 279 mg/dL — ABNORMAL HIGH (ref 0–200)
HDL: 50.6 mg/dL (ref >39.00)
Total CHOL/HDL Ratio: 6
Triglycerides: 307 mg/dL — ABNORMAL HIGH (ref 0.0–149.0)
VLDL: 61.4 mg/dL — ABNORMAL HIGH (ref 0.0–40.0)

## 2013-04-08 LAB — CBC WITH DIFFERENTIAL/PLATELET
Basophils Absolute: 0 10*3/uL (ref 0.0–0.1)
Eosinophils Absolute: 0.1 10*3/uL (ref 0.0–0.7)
Hemoglobin: 13.9 g/dL (ref 12.0–15.0)
Lymphocytes Relative: 32.5 % (ref 12.0–46.0)
MCHC: 33.1 g/dL (ref 30.0–36.0)
Monocytes Relative: 6.2 % (ref 3.0–12.0)
Neutrophils Relative %: 59.4 % (ref 43.0–77.0)
Platelets: 305 10*3/uL (ref 150.0–400.0)
RDW: 14.1 % (ref 11.5–14.6)

## 2013-04-08 LAB — HEPATIC FUNCTION PANEL
AST: 27 U/L (ref 0–37)
Alkaline Phosphatase: 65 U/L (ref 39–117)
Bilirubin, Direct: 0.1 mg/dL (ref 0.0–0.3)
Total Bilirubin: 0.5 mg/dL (ref 0.3–1.2)

## 2013-04-08 MED ORDER — AMPHETAMINE-DEXTROAMPHETAMINE 20 MG PO TABS: 20.0000 mg | ORAL_TABLET | Freq: Three times a day (TID) | ORAL | Status: DC

## 2013-04-08 NOTE — Progress Notes (Signed)
Subjective:    Patient ID: Christina Cowan, female    DOB: [birth year], 52 y.o.   MRN: [id number]  HPI  52 year old white female, nonsmoker is in for recheck of attention deficit disorder, hypertension, and hyperlipidemia. She reports that she can do well. Denies any concerns with her medications. Has concerns of right foot swelling and going x3 days. Reports that the pain is worse first thing in the morning and improves as the day progresses. She has not been taking any medication over-the-counter. She believes that maybe her dogs who is 90 pounds may have come down to her foot. However she's not really sure.  Review of Systems  Constitutional: Negative.   HENT: Negative.   Eyes: Negative.   Respiratory: Negative.   Cardiovascular: Negative.   Gastrointestinal: Negative.   Genitourinary: Negative.   Musculoskeletal: Negative.        Right foot pain  Skin: Negative.   Neurological: Negative.   Hematological: Negative.   Psychiatric/Behavioral: Negative.    Past Medical History  Diagnosis Date  . Asthma   . GERD (gastroesophageal reflux disease)   . Anxiety   . Arthritis     History   Social History  . Marital Status: Widowed    Spouse Name: N/A    Number of Children: N/A  . Years of Education: N/A   Occupational History  . Not on file.   Social History Main Topics  . Smoking status: Current Every Day Smoker -- 0.80 packs/day    Types: Cigarettes  . Smokeless tobacco: Never Used  . Alcohol Use: 2.4 oz/week    4 Glasses of wine per week     Comment: occ  . Drug Use: Yes    Special: Hydrocodone  . Sexually Active: Yes    Birth Control/ Protection: IUD   Other Topics Concern  . Not on file   Social History Narrative  . No narrative on file    Past Surgical History  Procedure Laterality Date  . Cesarean section      Family History  Problem Relation Age of Onset  . Arthritis Mother   . Hypertension Mother   . Arthritis Father   . Colon cancer Paternal Uncle      No Known Allergies  Current Outpatient Prescriptions on File Prior to Visit  Medication Sig Dispense Refill  . albuterol (PROVENTIL HFA) 108 (90 BASE) MCG/ACT inhaler Inhale 2 puffs into the lungs every 6 (six) hours as needed.  1 Inhaler  4  . atorvastatin (LIPITOR) 20 MG tablet Take 1 tablet (20 mg total) by mouth daily.  90 tablet  1  . carisoprodol (SOMA) 350 MG tablet Take 350 mg by mouth 3 (three) times daily as needed. For spasms       . HYDROcodone-acetaminophen (NORCO/VICODIN) 5-325 MG per tablet TAKE 1 TABLET BY MOUTH EVERY 8 HOURS AS NEEDED FOR PAIN  60 tablet  2  . losartan-hydrochlorothiazide (HYZAAR) 100-25 MG per tablet TAKE 1 TABLET BY MOUTH DAILY.  90 tablet  2  . senna (SENOKOT) 8.6 MG tablet Take 1 tablet by mouth 3 (three) times daily.  90 tablet  4  . venlafaxine XR (EFFEXOR-XR) 150 MG 24 hr capsule Take 1 capsule (150 mg total) by mouth 2 (two) times daily.  180 capsule  1  . albuterol (PROVENTIL,VENTOLIN) 90 MCG/ACT inhaler Inhale 2 puffs into the lungs every 6 (six) hours as needed for wheezing.  17 g  1   No current facility-administered medications on file prior to  visit.    BP 140/82  Pulse 94  Wt 172 lb (78.019 kg)  BMI 29.77 kg/m2chart    Objective:   Physical Exam  Constitutional: She is oriented to person, place, and time. She appears well-developed and well-nourished.  HENT:  Right Ear: External ear normal.  Left Ear: External ear normal.  Nose: Nose normal.  Mouth/Throat: Oropharynx is clear and moist.  Neck: Normal range of motion. Neck supple.  Cardiovascular: Normal rate, regular rhythm and normal heart sounds.   Pulmonary/Chest: Effort normal and breath sounds normal.  Abdominal: Bowel sounds are normal.  Musculoskeletal: Normal range of motion.  Right foot laterally, minimal swelling. Mild tenderness. Full ROM, no pain. No bruising.   Neurological: She is alert and oriented to person, place, and time. She has normal reflexes.  Skin: Skin  is warm and dry.  Psychiatric: She has a normal mood and affect.          Assessment & Plan:  Assessment:  1. Hypertension 2. Hyperlipidemia 3. ADD 4. Osteoarthritis of foot  Plan: Continue current medication. Aleve as needed for osteoarthritis of the foot. Call the office with any questions or concerns. Recheck as scheduled, in 3 months, and sooner as needed.

## 2013-05-06 ENCOUNTER — Telehealth: Payer: Self-pay | Admitting: Family

## 2013-05-06 NOTE — Telephone Encounter (Signed)
Christina Cowan aware, per Oran Rein, pt needs to wait until the refill date 05/09/13

## 2013-05-06 NOTE — Telephone Encounter (Signed)
Patient calling back, as the pharmacist did let her know she could not fill it today. Patients states the whole reason she wants to fill early is because she is going to [place] - leaving town tomorrow - and will be gone for 10 days. Please advise re this 2nd request to fill early.

## 2013-05-06 NOTE — Telephone Encounter (Signed)
Revonda Standard called and stated that the PT is requesting to fill amphetamine-dextroamphetamine (ADDERALL) 20 MG tablet   RX 3 days in advance. Please advise.

## 2013-05-06 NOTE — Telephone Encounter (Signed)
Please advise 

## 2013-05-07 ENCOUNTER — Other Ambulatory Visit: Payer: Self-pay | Admitting: Family

## 2013-05-15 ENCOUNTER — Encounter: Payer: Self-pay | Admitting: Family

## 2013-05-15 ENCOUNTER — Telehealth: Payer: Self-pay

## 2013-05-15 MED ORDER — HYDROCODONE-ACETAMINOPHEN 5-325 MG PO TABS: ORAL_TABLET | ORAL | Status: DC

## 2013-05-15 NOTE — Telephone Encounter (Signed)
Rx phoned in.   

## 2013-05-28 ENCOUNTER — Other Ambulatory Visit: Payer: Self-pay | Admitting: Family

## 2013-07-02 ENCOUNTER — Other Ambulatory Visit: Payer: Self-pay

## 2013-07-02 ENCOUNTER — Encounter: Payer: Self-pay | Admitting: Family

## 2013-07-02 DIAGNOSIS — F988 Other specified behavioral and emotional disorders with onset usually occurring in childhood and adolescence: Secondary | ICD-10-CM

## 2013-07-02 MED ORDER — AMPHETAMINE-DEXTROAMPHETAMINE 20 MG PO TABS: 20.0000 mg | ORAL_TABLET | Freq: Three times a day (TID) | ORAL | Status: DC

## 2013-07-04 ENCOUNTER — Other Ambulatory Visit: Payer: Self-pay

## 2013-07-08 ENCOUNTER — Other Ambulatory Visit: Payer: Self-pay

## 2013-07-08 ENCOUNTER — Encounter: Payer: Self-pay | Admitting: Family

## 2013-07-08 MED ORDER — HYDROCODONE-ACETAMINOPHEN 5-325 MG PO TABS: ORAL_TABLET | ORAL | Status: DC

## 2013-07-29 ENCOUNTER — Other Ambulatory Visit: Payer: Self-pay | Admitting: Family

## 2013-07-29 DIAGNOSIS — F988 Other specified behavioral and emotional disorders with onset usually occurring in childhood and adolescence: Secondary | ICD-10-CM

## 2013-07-29 MED ORDER — AMPHETAMINE-DEXTROAMPHETAMINE 20 MG PO TABS: 20.0000 mg | ORAL_TABLET | Freq: Three times a day (TID) | ORAL | Status: DC

## 2013-07-29 MED ORDER — HYDROCODONE-ACETAMINOPHEN 5-325 MG PO TABS: ORAL_TABLET | ORAL | Status: DC

## 2013-07-29 NOTE — Telephone Encounter (Signed)
Scripts printed

## 2013-07-30 ENCOUNTER — Other Ambulatory Visit: Payer: Self-pay | Admitting: Family

## 2013-07-30 ENCOUNTER — Encounter: Payer: Self-pay | Admitting: Family

## 2013-07-30 ENCOUNTER — Ambulatory Visit (INDEPENDENT_AMBULATORY_CARE_PROVIDER_SITE_OTHER): Payer: Self-pay | Admitting: Family

## 2013-07-30 VITALS — BP 142/90 | HR 91 | Wt 180.0 lb

## 2013-07-30 DIAGNOSIS — K219 Gastro-esophageal reflux disease without esophagitis: Secondary | ICD-10-CM

## 2013-07-30 DIAGNOSIS — F988 Other specified behavioral and emotional disorders with onset usually occurring in childhood and adolescence: Secondary | ICD-10-CM

## 2013-07-30 DIAGNOSIS — I10 Essential (primary) hypertension: Secondary | ICD-10-CM

## 2013-07-30 MED ORDER — ATORVASTATIN CALCIUM 20 MG PO TABS: 20.0000 mg | ORAL_TABLET | Freq: Every day | ORAL | Status: DC

## 2013-07-30 MED ORDER — AMPHETAMINE-DEXTROAMPHETAMINE 20 MG PO TABS: 20.0000 mg | ORAL_TABLET | Freq: Three times a day (TID) | ORAL | Status: DC

## 2013-07-30 MED ORDER — VENLAFAXINE HCL ER 150 MG PO CP24: 150.0000 mg | ORAL_CAPSULE | Freq: Two times a day (BID) | ORAL | Status: DC

## 2013-07-30 MED ORDER — LOSARTAN POTASSIUM-HCTZ 100-25 MG PO TABS: 1.0000 | ORAL_TABLET | Freq: Every day | ORAL | Status: DC

## 2013-07-30 NOTE — Progress Notes (Signed)
Subjective:    Patient ID: Christina Cowan, female    DOB: [birth year], 52 y.o.   MRN: [id number]  HPI 52 year old white female, smoker, is in today for recheck of attention deficit disorder, hypertension, hyperlipidemia, and depression. She reports that she's been doing well on current medications. Denies any concerns. Patient is requesting to have labs drawn early next year after her insurance is valid.   Review of Systems  Constitutional: Negative.   HENT: Negative.   Respiratory: Negative.   Cardiovascular: Negative.   Gastrointestinal: Negative.   Endocrine: Negative.   Genitourinary: Negative.   Musculoskeletal: Negative.   Allergic/Immunologic: Negative.   Neurological: Negative.   Hematological: Negative.   Psychiatric/Behavioral: Negative.    Past Medical History  Diagnosis Date  . Asthma   . GERD (gastroesophageal reflux disease)   . Anxiety   . Arthritis     History   Social History  . Marital Status: Widowed    Spouse Name: N/A    Number of Children: N/A  . Years of Education: N/A   Occupational History  . Not on file.   Social History Main Topics  . Smoking status: Current Every Day Smoker -- 0.80 packs/day    Types: Cigarettes  . Smokeless tobacco: Never Used  . Alcohol Use: 2.4 oz/week    4 Glasses of wine per week     Comment: occ  . Drug Use: Yes    Special: Hydrocodone  . Sexual Activity: Yes    Birth Control/ Protection: IUD   Other Topics Concern  . Not on file   Social History Narrative  . No narrative on file    Past Surgical History  Procedure Laterality Date  . Cesarean section      Family History  Problem Relation Age of Onset  . Arthritis Mother   . Hypertension Mother   . Arthritis Father   . Colon cancer Paternal Uncle     No Known Allergies  Current Outpatient Prescriptions on File Prior to Visit  Medication Sig Dispense Refill  . albuterol (PROVENTIL HFA) 108 (90 BASE) MCG/ACT inhaler Inhale 2 puffs into the lungs  every 6 (six) hours as needed.  1 Inhaler  4  . HYDROcodone-acetaminophen (NORCO/VICODIN) 5-325 MG per tablet TAKE 1 TABLET BY MOUTH EVERY 8 HOURS AS NEEDED FOR PAIN  60 tablet  0  . albuterol (PROVENTIL,VENTOLIN) 90 MCG/ACT inhaler Inhale 2 puffs into the lungs every 6 (six) hours as needed for wheezing.  17 g  1  . senna (SENOKOT) 8.6 MG tablet Take 1 tablet by mouth 3 (three) times daily.  90 tablet  4   No current facility-administered medications on file prior to visit.    BP 142/90  Pulse 91  Wt 180 lb (81.647 kg)chart    Objective:   Physical Exam  Constitutional: She is oriented to person, place, and time. She appears well-developed and well-nourished.  Neck: Normal range of motion. Neck supple.  Cardiovascular: Normal rate, regular rhythm and normal heart sounds.   Pulmonary/Chest: Effort normal and breath sounds normal.  Abdominal: Soft. Bowel sounds are normal.  Musculoskeletal: Normal range of motion.  Neurological: She is alert and oriented to person, place, and time.  Skin: Skin is warm and dry.  Psychiatric: She has a normal mood and affect.          Assessment & Plan:  Assessment: 1. Attention deficit disorder 2. Chronic back pain 3. Hyperlipidemia 4. Depression  Plan: Labs will be sent in one  month. Continue current medications. Encouraged healthy diet, exercise. We'll follow with patient and 3 months and sooner as needed.

## 2013-08-26 ENCOUNTER — Encounter: Payer: Self-pay | Admitting: Family

## 2013-09-10 ENCOUNTER — Encounter: Payer: Self-pay | Admitting: Family

## 2013-09-10 ENCOUNTER — Telehealth: Payer: Self-pay

## 2013-09-10 DIAGNOSIS — M549 Dorsalgia, unspecified: Principal | ICD-10-CM

## 2013-09-10 DIAGNOSIS — G8929 Other chronic pain: Secondary | ICD-10-CM

## 2013-09-10 MED ORDER — HYDROCODONE-ACETAMINOPHEN 5-325 MG PO TABS: ORAL_TABLET | ORAL | Status: DC

## 2013-09-10 NOTE — Telephone Encounter (Signed)
Message sent to pt to advise of referral to pain clinic

## 2013-10-30 ENCOUNTER — Encounter: Payer: Self-pay | Admitting: Family

## 2013-10-31 ENCOUNTER — Other Ambulatory Visit: Payer: Self-pay

## 2013-10-31 DIAGNOSIS — F988 Other specified behavioral and emotional disorders with onset usually occurring in childhood and adolescence: Secondary | ICD-10-CM

## 2013-10-31 MED ORDER — AMPHETAMINE-DEXTROAMPHETAMINE 20 MG PO TABS: 20.0000 mg | ORAL_TABLET | Freq: Three times a day (TID) | ORAL | Status: DC

## 2013-10-31 NOTE — Telephone Encounter (Signed)
Pt came into the office for rx and it ws not ready. Dr. Caryl NeverBurchette signed the rx.

## 2013-11-12 ENCOUNTER — Ambulatory Visit: Payer: Self-pay | Admitting: Family

## 2013-11-19 ENCOUNTER — Ambulatory Visit: Payer: Self-pay | Admitting: Family

## 2013-11-19 DIAGNOSIS — Z0289 Encounter for other administrative examinations: Secondary | ICD-10-CM

## 2013-12-09 ENCOUNTER — Ambulatory Visit: Payer: Self-pay | Admitting: Family

## 2013-12-09 DIAGNOSIS — Z0289 Encounter for other administrative examinations: Secondary | ICD-10-CM

## 2013-12-12 ENCOUNTER — Other Ambulatory Visit: Payer: Self-pay | Admitting: Family

## 2013-12-12 ENCOUNTER — Encounter: Payer: Self-pay | Admitting: Family

## 2013-12-13 ENCOUNTER — Telehealth: Payer: Self-pay | Admitting: Family

## 2013-12-13 NOTE — Telephone Encounter (Signed)
Pt was notified via MyChart that she is unable to get refill until she is seen in the office

## 2013-12-13 NOTE — Telephone Encounter (Signed)
Pt req rx amphetamine-dextroamphetamine (ADDERALL) 20 MG tablet ° °

## 2013-12-17 ENCOUNTER — Ambulatory Visit (INDEPENDENT_AMBULATORY_CARE_PROVIDER_SITE_OTHER): Payer: Self-pay | Admitting: Family

## 2013-12-17 ENCOUNTER — Telehealth: Payer: Self-pay | Admitting: Family

## 2013-12-17 ENCOUNTER — Encounter: Payer: Self-pay | Admitting: Family

## 2013-12-17 VITALS — BP 144/92 | HR 103 | Wt 181.0 lb

## 2013-12-17 DIAGNOSIS — F988 Other specified behavioral and emotional disorders with onset usually occurring in childhood and adolescence: Secondary | ICD-10-CM

## 2013-12-17 DIAGNOSIS — F430 Acute stress reaction: Secondary | ICD-10-CM

## 2013-12-17 DIAGNOSIS — F411 Generalized anxiety disorder: Secondary | ICD-10-CM

## 2013-12-17 MED ORDER — AMPHETAMINE-DEXTROAMPHETAMINE 20 MG PO TABS: 20.0000 mg | ORAL_TABLET | Freq: Three times a day (TID) | ORAL | Status: DC

## 2013-12-17 MED ORDER — TEMAZEPAM 7.5 MG PO CAPS: 7.5000 mg | ORAL_CAPSULE | Freq: Every evening | ORAL | Status: DC | PRN

## 2013-12-17 MED ORDER — HYDROCODONE-ACETAMINOPHEN 5-325 MG PO TABS: ORAL_TABLET | ORAL | Status: DC

## 2013-12-17 NOTE — Patient Instructions (Signed)
Grief Reaction  Grief is a normal response to the death of someone close to you. Feelings of fear, anger, and guilt can affect almost everyone who loses someone they love. Symptoms of depression are also common. These include problems with sleep, loss of appetite, and lack of energy. These grief reaction symptoms often last for weeks to months after a loss. They may also return during special times that remind you of the person you lost, such as an anniversary or birthday.  Anxiety, insomnia, irritability, and deep depression may last beyond the period of normal grief. If you experience these feelings for 6 months or longer, you may have clinical depression. Clinical depression requires further medical attention. If you think that you have clinical depression, you should contact your caregiver. If you have a history of depression and or a family history of depression, you are at greater risk of clinical depression. You are also at greater risk of developing clinical depression if the loss was traumatic or the loss was of someone with whom you had unresolved issues.   A grief reaction can become complicated by being blocked. This means being unable to cry or express extreme emotions. This may prolong the grieving period and worsen the emotional effects of the loss. Mourning is a natural event in human life. A healthy grief reaction is one that is not blocked . It requires a time of sadness and readjustment.It is very important to share your sorrow and fear with others, especially close friends and family. Professional counselors and clergy can also help you process your grief.  Document Released: 08/15/2005 Document Revised: 11/07/2011 Document Reviewed: 04/25/2006  [employer] Patient Information 2014 [employer].

## 2013-12-17 NOTE — Telephone Encounter (Signed)
Relevant patient education mailed to patient.  

## 2013-12-17 NOTE — Progress Notes (Signed)
Subjective:    Patient ID: Christina BinetAnne Seubert, female    DOB: 10-05-1960, 53 y.o.   MRN: [id number]  HPI  53 year old caucasian female, smoker presenting today for follow up for ADD.  She was last seen 07/30/2013.  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  She feels overwhelmed because she is dying.  She is not sleeping well and requesting something to help her. She is taking current medications as prescribed and is tolerating them well.    Review of Systems  Constitutional: Negative.   Respiratory: Negative.   Cardiovascular: Negative.   Gastrointestinal: Negative.   Endocrine: Negative.   Genitourinary: Negative.   Musculoskeletal: Negative.   Skin: Negative.   Neurological: Negative.  Negative for light-headedness and headaches.  Psychiatric/Behavioral: Positive for sleep disturbance. The patient is nervous/anxious.    Past Medical History  Diagnosis Date  . Asthma   . GERD (gastroesophageal reflux disease)   . Anxiety   . Arthritis     History   Social History  . Marital Status: Widowed    Spouse Name: N/A    Number of Children: N/A  . Years of Education: N/A   Occupational History  . Not on file.   Social History Main Topics  . Smoking status: Current Every Day Smoker -- 0.80 packs/day    Types: Cigarettes  . Smokeless tobacco: Never Used  . Alcohol Use: 2.4 oz/week    4 Glasses of wine per week     Comment: occ  . Drug Use: Yes    Special: Hydrocodone  . Sexual Activity: Yes    Birth Control/ Protection: IUD   Other Topics Concern  . Not on file   Social History Narrative  . No narrative on file    Past Surgical History  Procedure Laterality Date  . Cesarean section      Family History  Problem Relation Age of Onset  . Arthritis Mother   . Hypertension Mother   . Arthritis Father   . Colon cancer Paternal Uncle     No Known Allergies  Current Outpatient Prescriptions on File Prior to Visit  Medication Sig Dispense Refill  . albuterol  (PROVENTIL HFA) 108 (90 BASE) MCG/ACT inhaler Inhale 2 puffs into the lungs every 6 (six) hours as needed.  1 Inhaler  4  . atorvastatin (LIPITOR) 20 MG tablet Take 1 tablet (20 mg total) by mouth daily.  30 tablet  4  . losartan-hydrochlorothiazide (HYZAAR) 100-25 MG per tablet TAKE 1 TABLET BY MOUTH EVERY DAY  90 tablet  2  . losartan-hydrochlorothiazide (HYZAAR) 100-25 MG per tablet Take 1 tablet by mouth daily.  30 tablet  4  . senna (SENOKOT) 8.6 MG tablet Take 1 tablet by mouth 3 (three) times daily.  90 tablet  4  . venlafaxine XR (EFFEXOR-XR) 150 MG 24 hr capsule Take 1 capsule (150 mg total) by mouth 2 (two) times daily.  60 capsule  4  . albuterol (PROVENTIL,VENTOLIN) 90 MCG/ACT inhaler Inhale 2 puffs into the lungs every 6 (six) hours as needed for wheezing.  17 g  1   No current facility-administered medications on file prior to visit.    BP 144/92  Pulse 103  Wt 181 lb (82.101 kg)  SpO2 98%chart    Objective:   Physical Exam  Constitutional: She is oriented to person, place, and time. She appears well-developed and well-nourished. No distress.  Neck: Normal range of motion. Neck supple.  Cardiovascular: Normal rate, regular  rhythm and normal heart sounds.   Pulmonary/Chest: Effort normal and breath sounds normal.  Neurological: She is alert and oriented to person, place, and time. No cranial nerve deficit.  Skin: Skin is warm and dry.  Psychiatric: Her behavior is normal. Judgment and thought content normal.  Patient had flat affect and crying           Assessment & Plan:  Assessment 1. ADD 2. Depression 3. Insomnia  Plan 1. Continue current medications as scheduled.  2.Refills as needed. 3. Start Restoril 7.5 mg PO Qhs for sleep.  4 Information for grief counseling given. 5. Contact office for questions or concerns.

## 2014-01-16 ENCOUNTER — Telehealth: Payer: Self-pay | Admitting: Family

## 2014-01-16 MED ORDER — LOSARTAN POTASSIUM-HCTZ 100-25 MG PO TABS: ORAL_TABLET | ORAL | Status: DC

## 2014-01-16 NOTE — Telephone Encounter (Signed)
[id number] - [place], [place] - [address] is requesting re-fill on losartan-hydrochlorothiazide (HYZAAR) 100-25 MG per tablet °

## 2014-01-16 NOTE — Telephone Encounter (Signed)
Rx sent to pharmacy   

## 2014-02-05 ENCOUNTER — Other Ambulatory Visit: Payer: Self-pay | Admitting: Family

## 2014-02-27 ENCOUNTER — Ambulatory Visit: Payer: Self-pay | Admitting: Physician Assistant

## 2014-02-27 DIAGNOSIS — Z0289 Encounter for other administrative examinations: Secondary | ICD-10-CM

## 2014-03-11 ENCOUNTER — Encounter: Payer: Self-pay | Admitting: Family

## 2014-03-11 ENCOUNTER — Other Ambulatory Visit: Payer: Self-pay

## 2014-03-11 MED ORDER — HYDROCODONE-ACETAMINOPHEN 5-325 MG PO TABS: ORAL_TABLET | ORAL | Status: DC

## 2014-03-18 ENCOUNTER — Encounter: Payer: Self-pay | Admitting: Family

## 2014-03-18 ENCOUNTER — Ambulatory Visit (INDEPENDENT_AMBULATORY_CARE_PROVIDER_SITE_OTHER): Payer: Self-pay | Admitting: Family

## 2014-03-18 VITALS — BP 144/80 | HR 103 | Wt 185.0 lb

## 2014-03-18 DIAGNOSIS — F988 Other specified behavioral and emotional disorders with onset usually occurring in childhood and adolescence: Secondary | ICD-10-CM | POA: Insufficient documentation

## 2014-03-18 DIAGNOSIS — B029 Zoster without complications: Secondary | ICD-10-CM | POA: Insufficient documentation

## 2014-03-18 MED ORDER — HYDROCODONE-ACETAMINOPHEN 5-325 MG PO TABS: ORAL_TABLET | ORAL | Status: DC

## 2014-03-18 MED ORDER — AMPHETAMINE-DEXTROAMPHETAMINE 20 MG PO TABS: 20.0000 mg | ORAL_TABLET | Freq: Three times a day (TID) | ORAL | Status: DC

## 2014-03-18 NOTE — Patient Instructions (Signed)
Advised to take adderal for ADD. Take hydrocodone for chronic back pain and her post herpetic neuralgia. Follow up in 3 months or prn.

## 2014-03-18 NOTE — Assessment & Plan Note (Signed)
With recent pain consistent with post herpetic neuralgia. Hydrocodone prescribed today. Note chronic back pain history as well.

## 2014-03-18 NOTE — Assessment & Plan Note (Signed)
Refilled adderal rx.

## 2014-03-18 NOTE — Progress Notes (Signed)
Pre visit review using our clinic review tool, if applicable. No additional management support is needed unless otherwise documented below in the visit note. 

## 2014-03-18 NOTE — Progress Notes (Signed)
Subjective:    Patient ID: Christina Cowan, female    DOB: 1961-03-25, 53 y.o.   MRN: [id number]  HPI   Pt in for visit for ADD. She states her attention and concentration is controlled with med. Rare transient sensation of heart rate increase if takes med on empty stomach. On med since around 2008.   Pt has rt side oubreak of shingles that has been present for 3 wks. Pt had a lot stress in life that preceded. She reports one spot that itches. She reports some pain when she touches area. Pt was on pain medication for nerve pain. Pt needs refill for nerve pain and for longstanding back pain.     Review of Systems  Constitutional: Negative for fever, chills and fatigue.  HENT: Negative.   Respiratory: Negative.   Cardiovascular: Negative.   Skin: Positive for rash.       Shingle like rash rt rib region beneath. Extending anterior-posterior thorax.  Neurological: Negative for dizziness, tremors, seizures, light-headedness, numbness and headaches.       Rt side thorax rash nerve pain. Also chronic low back pain.  Psychiatric/Behavioral: Positive for decreased concentration. Negative for suicidal ideas, hallucinations, behavioral problems, sleep disturbance, dysphoric mood and agitation. The patient is not nervous/anxious.        When on meds her concentration is better.   Past Medical History  Diagnosis Date  . Asthma   . GERD (gastroesophageal reflux disease)   . Anxiety   . Arthritis     History   Social History  . Marital Status: Widowed    Spouse Name: N/A    Number of Children: N/A  . Years of Education: N/A   Occupational History  . Not on file.   Social History Main Topics  . Smoking status: Current Every Day Smoker -- 0.80 packs/day    Types: Cigarettes  . Smokeless tobacco: Never Used  . Alcohol Use: 2.4 oz/week    4 Glasses of wine per week     Comment: occ  . Drug Use: Yes    Special: Hydrocodone  . Sexual Activity: Yes    Birth Control/ Protection: IUD    Other Topics Concern  . Not on file   Social History Narrative  . No narrative on file    Past Surgical History  Procedure Laterality Date  . Cesarean section      Family History  Problem Relation Age of Onset  . Arthritis Mother   . Hypertension Mother   . Arthritis Father   . Colon cancer Paternal Uncle     No Known Allergies  Current Outpatient Prescriptions on File Prior to Visit  Medication Sig Dispense Refill  . albuterol (PROVENTIL HFA) 108 (90 BASE) MCG/ACT inhaler Inhale 2 puffs into the lungs every 6 (six) hours as needed.  1 Inhaler  4  . atorvastatin (LIPITOR) 20 MG tablet Take 1 tablet (20 mg total) by mouth daily.  30 tablet  4  . losartan-hydrochlorothiazide (HYZAAR) 100-25 MG per tablet Take 1 tablet by mouth daily.  30 tablet  4  . losartan-hydrochlorothiazide (HYZAAR) 100-25 MG per tablet TAKE 1 TABLET BY MOUTH EVERY DAY  90 tablet  1  . senna (SENOKOT) 8.6 MG tablet Take 1 tablet by mouth 3 (three) times daily.  90 tablet  4  . venlafaxine XR (EFFEXOR-XR) 150 MG 24 hr capsule TAKE 1 CAPSULE BY MOUTH TWICE DAILY  60 capsule  3  . albuterol (PROVENTIL,VENTOLIN) 90 MCG/ACT inhaler Inhale 2  puffs into the lungs every 6 (six) hours as needed for wheezing.  17 g  1   No current facility-administered medications on file prior to visit.    BP 144/80  Pulse 103  Wt 185 lb (83.915 kg)chart    Objective:   Physical Exam  Constitutional: She is oriented to person, place, and time. She appears well-developed and well-nourished.  Neck: Normal range of motion. Neck supple.  Cardiovascular: Normal rate, regular rhythm and normal heart sounds.   Pulmonary/Chest: Effort normal and breath sounds normal.  Musculoskeletal: Normal range of motion.  Neurological: She is alert and oriented to person, place, and time.  Skin: Skin is warm and dry. Rash noted. There is erythema.  Healing zoster rash to the right torso extending to the right mid back  Psychiatric: She has  a normal mood and affect.             Assessment & Plan:  Assessment: 1. Attention deficit disorder 2. Chronic low back pain  Plan: Prescriptions given to patient for Adderall and hydrocodone for the next 3 months. Encouraged healthy diet and exercise. Recheck in 3 months and sooner as needed.

## 2014-03-26 ENCOUNTER — Encounter: Payer: Self-pay | Admitting: Family

## 2014-03-27 ENCOUNTER — Telehealth: Payer: Self-pay | Admitting: Family

## 2014-03-27 NOTE — Telephone Encounter (Signed)
Pt returned rx for HYDROcodone-acetaminophen (NORCO/VICODIN) 5-325 MG per tablet, states padonda informed her to bring the old back and she would write her a new one. Please call when available for pick up.

## 2014-03-28 MED ORDER — HYDROCODONE-ACETAMINOPHEN 10-325 MG PO TABS
1.0000 | ORAL_TABLET | Freq: Three times a day (TID) | ORAL | Status: DC | PRN
Start: 2014-03-28 — End: 2014-06-17

## 2014-03-28 NOTE — Telephone Encounter (Signed)
Advise RX available

## 2014-03-28 NOTE — Telephone Encounter (Signed)
Pt aware.

## 2014-05-28 ENCOUNTER — Other Ambulatory Visit: Payer: Self-pay | Admitting: Family

## 2014-06-17 ENCOUNTER — Ambulatory Visit (INDEPENDENT_AMBULATORY_CARE_PROVIDER_SITE_OTHER): Payer: Self-pay | Admitting: Family

## 2014-06-17 ENCOUNTER — Encounter: Payer: Self-pay | Admitting: Family

## 2014-06-17 VITALS — BP 150/90 | HR 103 | Wt 178.0 lb

## 2014-06-17 DIAGNOSIS — F32A Depression, unspecified: Secondary | ICD-10-CM

## 2014-06-17 DIAGNOSIS — Z23 Encounter for immunization: Secondary | ICD-10-CM

## 2014-06-17 DIAGNOSIS — I10 Essential (primary) hypertension: Secondary | ICD-10-CM

## 2014-06-17 DIAGNOSIS — F909 Attention-deficit hyperactivity disorder, unspecified type: Secondary | ICD-10-CM

## 2014-06-17 DIAGNOSIS — E669 Obesity, unspecified: Secondary | ICD-10-CM

## 2014-06-17 DIAGNOSIS — F329 Major depressive disorder, single episode, unspecified: Secondary | ICD-10-CM

## 2014-06-17 DIAGNOSIS — E7800 Pure hypercholesterolemia, unspecified: Secondary | ICD-10-CM

## 2014-06-17 DIAGNOSIS — E785 Hyperlipidemia, unspecified: Secondary | ICD-10-CM

## 2014-06-17 DIAGNOSIS — F988 Other specified behavioral and emotional disorders with onset usually occurring in childhood and adolescence: Secondary | ICD-10-CM

## 2014-06-17 HISTORY — DX: Pure hypercholesterolemia, unspecified: E78.00

## 2014-06-17 HISTORY — DX: Essential (primary) hypertension: I10

## 2014-06-17 LAB — BASIC METABOLIC PANEL
BUN: 12 mg/dL (ref 6–23)
CHLORIDE: 101 meq/L (ref 96–112)
CO2: 23 mEq/L (ref 19–32)
Calcium: 9.4 mg/dL (ref 8.4–10.5)
Creatinine, Ser: 1 mg/dL (ref 0.4–1.2)
GFR: 62.7 mL/min (ref >60.00)
Glucose, Bld: 85 mg/dL (ref 70–99)
POTASSIUM: 4.2 meq/L (ref 3.5–5.1)
Sodium: 139 mEq/L (ref 135–145)

## 2014-06-17 LAB — HEPATIC FUNCTION PANEL
ALK PHOS: 72 U/L (ref 39–117)
ALT: 22 U/L (ref 0–35)
AST: 20 U/L (ref 0–37)
Albumin: 3.9 g/dL (ref 3.5–5.2)
BILIRUBIN DIRECT: 0.1 mg/dL (ref 0.0–0.3)
BILIRUBIN TOTAL: 0.7 mg/dL (ref 0.2–1.2)
Total Protein: 8.1 g/dL (ref 6.0–8.3)

## 2014-06-17 LAB — LIPID PANEL
Cholesterol: 282 mg/dL — ABNORMAL HIGH (ref 0–200)
HDL: 51.4 mg/dL (ref >39.00)
NONHDL: 230.6
Total CHOL/HDL Ratio: 5
Triglycerides: 338 mg/dL — ABNORMAL HIGH (ref 0.0–149.0)
VLDL: 67.6 mg/dL — AB (ref 0.0–40.0)

## 2014-06-17 LAB — LDL CHOLESTEROL, DIRECT: Direct LDL: 181.3 mg/dL

## 2014-06-17 MED ORDER — AMPHETAMINE-DEXTROAMPHETAMINE 20 MG PO TABS: 20.0000 mg | ORAL_TABLET | Freq: Three times a day (TID) | ORAL | Status: DC

## 2014-06-17 MED ORDER — LOSARTAN POTASSIUM-HCTZ 100-25 MG PO TABS: 1.0000 | ORAL_TABLET | Freq: Every day | ORAL | Status: DC

## 2014-06-17 MED ORDER — VENLAFAXINE HCL ER 150 MG PO CP24: ORAL_CAPSULE | ORAL | Status: DC

## 2014-06-17 MED ORDER — HYDROCODONE-ACETAMINOPHEN 10-325 MG PO TABS: 1.0000 | ORAL_TABLET | Freq: Three times a day (TID) | ORAL | Status: DC | PRN

## 2014-06-17 MED ORDER — ATORVASTATIN CALCIUM 20 MG PO TABS: 20.0000 mg | ORAL_TABLET | Freq: Every day | ORAL | Status: DC

## 2014-06-17 NOTE — Progress Notes (Signed)
Subjective:    Patient ID: Christina BinetAnne Cowan, female    DOB: 1961/02/11, 53 y.o.   MRN: [id number]  HPI 53 year old white female, smoker with a history of attention deficit disorder, hypercholesterolemia, hypertension, and depression is in today for recheck. Denies any concerns. Requesting medication refills.   Review of Systems  Constitutional: Negative.   HENT: Negative.   Eyes: Negative.   Respiratory: Negative.   Cardiovascular: Negative.   Gastrointestinal: Negative.   Endocrine: Negative.   Genitourinary: Negative.   Musculoskeletal: Negative.   Skin: Negative.   Neurological: Negative.   Hematological: Negative.   Psychiatric/Behavioral: Negative.    Past Medical History  Diagnosis Date  . Asthma   . GERD (gastroesophageal reflux disease)   . Anxiety   . Arthritis   . Essential hypertension, benign 06/17/2014  . Pure hypercholesterolemia 06/17/2014    History   Social History  . Marital Status: Widowed    Spouse Name: N/A    Number of Children: N/A  . Years of Education: N/A   Occupational History  . Not on file.   Social History Main Topics  . Smoking status: Current Every Day Smoker -- 0.80 packs/day    Types: Cigarettes  . Smokeless tobacco: Never Used  . Alcohol Use: 2.4 oz/week    4 Glasses of wine per week     Comment: occ  . Drug Use: Yes    Special: Hydrocodone  . Sexual Activity: Yes    Birth Control/ Protection: IUD   Other Topics Concern  . Not on file   Social History Narrative  . No narrative on file    Past Surgical History  Procedure Laterality Date  . Cesarean section      Family History  Problem Relation Age of Onset  . Arthritis Mother   . Hypertension Mother   . Arthritis Father   . Colon cancer Paternal Uncle     No Known Allergies  Current Outpatient Prescriptions on File Prior to Visit  Medication Sig Dispense Refill  . albuterol (PROVENTIL HFA) 108 (90 BASE) MCG/ACT inhaler Inhale 2 puffs into the lungs every 6  (six) hours as needed.  1 Inhaler  4  . senna (SENOKOT) 8.6 MG tablet Take 1 tablet by mouth 3 (three) times daily.  90 tablet  4  . albuterol (PROVENTIL,VENTOLIN) 90 MCG/ACT inhaler Inhale 2 puffs into the lungs every 6 (six) hours as needed for wheezing.  17 g  1   No current facility-administered medications on file prior to visit.    BP 150/90  Pulse 103  Wt 178 lb (80.74 kg)chart    Objective:   Physical Exam  Constitutional: She is oriented to person, place, and time. She appears well-developed and well-nourished.  HENT:  Right Ear: External ear normal.  Left Ear: External ear normal.  Nose: Nose normal.  Mouth/Throat: Oropharynx is clear and moist.  Neck: Normal range of motion. Neck supple. No thyromegaly present.  Cardiovascular: Normal rate, regular rhythm and normal heart sounds.   Pulmonary/Chest: Effort normal and breath sounds normal.  Abdominal: Soft. Bowel sounds are normal.  Musculoskeletal: Normal range of motion.  Neurological: She is alert and oriented to person, place, and time.  Skin: Skin is warm and dry.  Psychiatric: She has a normal mood and affect.          Assessment & Plan:  Christina Cowan was seen today for follow-up.  Diagnoses and associated orders for this visit:  ADD (attention deficit disorder) - Discontinue: amphetamine-dextroamphetamine (  ADDERALL) 20 MG tablet; Take 1 tablet (20 mg total) by mouth 3 (three) times daily. - amphetamine-dextroamphetamine (ADDERALL) 20 MG tablet; Take 1 tablet (20 mg total) by mouth 3 (three) times daily. - amphetamine-dextroamphetamine (ADDERALL) 20 MG tablet; Take 1 tablet (20 mg total) by mouth 3 (three) times daily.  Depression  Hyperlipidemia - Lipid Panel - Basic Metabolic Panel - Hepatic Function Panel  Uncontrolled hypertension  Obesity  Essential hypertension, benign  Pure hypercholesterolemia  Other Orders - losartan-hydrochlorothiazide (HYZAAR) 100-25 MG per tablet; Take 1 tablet by mouth  daily. - venlafaxine XR (EFFEXOR-XR) 150 MG 24 hr capsule; TAKE 1 CAPSULE BY MOUTH TWICE DAILY - atorvastatin (LIPITOR) 20 MG tablet; Take 1 tablet (20 mg total) by mouth daily. - HYDROcodone-acetaminophen (NORCO) 10-325 MG per tablet; Take 1 tablet by mouth every 8 (eight) hours as needed.   Call the office with any questions or concerns. Recheck as scheduled in 3 months and sooner as needed

## 2014-06-17 NOTE — Patient Instructions (Signed)
Exercise to Lose Weight Exercise and a healthy diet may help you lose weight. Your doctor may suggest specific exercises. EXERCISE IDEAS AND TIPS  Choose low-cost things you enjoy doing, such as walking, bicycling, or exercising to workout videos.  Take stairs instead of the elevator.  Walk during your lunch break.  Park your car further away from work or school.  Go to a gym or an exercise class.  Start with 5 to 10 minutes of exercise each day. Build up to 30 minutes of exercise 4 to 6 days a week.  Wear shoes with good support and comfortable clothes.  Stretch before and after working out.  Work out until you breathe harder and your heart beats faster.  Drink extra water when you exercise.  Do not do so much that you hurt yourself, feel dizzy, or get very short of breath. Exercises that burn about 150 calories:  Running 1  miles in 15 minutes.  Playing volleyball for 45 to 60 minutes.  Washing and waxing a car for 45 to 60 minutes.  Playing touch football for 45 minutes.  Walking 1  miles in 35 minutes.  Pushing a stroller 1  miles in 30 minutes.  Playing basketball for 30 minutes.  Raking leaves for 30 minutes.  Bicycling 5 miles in 30 minutes.  Walking 2 miles in 30 minutes.  Dancing for 30 minutes.  Shoveling snow for 15 minutes.  Swimming laps for 20 minutes.  Walking up stairs for 15 minutes.  Bicycling 4 miles in 15 minutes.  Gardening for 30 to 45 minutes.  Jumping rope for 15 minutes.  Washing windows or floors for 45 to 60 minutes. Document Released: 09/17/2010 Document Revised: 11/07/2011 Document Reviewed: 09/17/2010 ExitCare Patient Information 2015 [employer]. This information is not intended to replace advice given to you by your health care provider. Make sure you discuss any questions you have with your health care provider.  

## 2014-06-17 NOTE — Progress Notes (Signed)
Pre visit review using our clinic review tool, if applicable. No additional management support is needed unless otherwise documented below in the visit note. 

## 2014-06-18 ENCOUNTER — Telehealth: Payer: Self-pay | Admitting: Family

## 2014-06-18 NOTE — Telephone Encounter (Signed)
emmi emailed °

## 2014-08-20 ENCOUNTER — Other Ambulatory Visit: Payer: Self-pay | Admitting: Family

## 2014-08-20 ENCOUNTER — Encounter: Payer: Self-pay | Admitting: Family

## 2014-08-20 MED ORDER — HYDROCODONE-ACETAMINOPHEN 10-325 MG PO TABS: 1.0000 | ORAL_TABLET | Freq: Three times a day (TID) | ORAL | Status: DC | PRN

## 2014-08-20 NOTE — Telephone Encounter (Signed)
rx printed

## 2014-09-13 ENCOUNTER — Other Ambulatory Visit: Payer: Self-pay | Admitting: Family

## 2014-09-17 ENCOUNTER — Encounter: Payer: Self-pay | Admitting: Family

## 2014-09-17 ENCOUNTER — Ambulatory Visit (INDEPENDENT_AMBULATORY_CARE_PROVIDER_SITE_OTHER): Payer: Self-pay | Admitting: Family

## 2014-09-17 VITALS — BP 158/80 | HR 107 | Temp 97.8°F | Wt 178.7 lb

## 2014-09-17 DIAGNOSIS — F988 Other specified behavioral and emotional disorders with onset usually occurring in childhood and adolescence: Secondary | ICD-10-CM

## 2014-09-17 DIAGNOSIS — H938X1 Other specified disorders of right ear: Secondary | ICD-10-CM

## 2014-09-17 DIAGNOSIS — F909 Attention-deficit hyperactivity disorder, unspecified type: Secondary | ICD-10-CM

## 2014-09-17 MED ORDER — AMPHETAMINE-DEXTROAMPHETAMINE 20 MG PO TABS: 20.0000 mg | ORAL_TABLET | Freq: Three times a day (TID) | ORAL | Status: DC

## 2014-09-17 NOTE — Progress Notes (Signed)
Subjective:    Patient ID: Christina Cowan, female    DOB: 06-30-1961, 54 y.o.   MRN: [id number]  HPI 54 y.o. White female presents today for follow up for A.D.D. Pt has been taking  of Adderall 3 times per day and states that it works well for her. Denies any headaches, changes in appetite and mood swings. Pt also has complaint of right ear pressure that began last week. Pt states that she has not taken any medication for the pressure and the pressure is "all over my head". Denies changes in hearing, fever, SOB, discharge.   Past Medical History  Diagnosis Date  . Asthma   . GERD (gastroesophageal reflux disease)   . Anxiety   . Arthritis   . Essential hypertension, benign 06/17/2014  . Pure hypercholesterolemia 06/17/2014    History   Social History  . Marital Status: Widowed    Spouse Name: N/A    Number of Children: N/A  . Years of Education: N/A   Occupational History  . Not on file.   Social History Main Topics  . Smoking status: Current Every Day Smoker -- 0.80 packs/day    Types: Cigarettes  . Smokeless tobacco: Never Used  . Alcohol Use: 2.4 oz/week    4 Glasses of wine per week     Comment: occ  . Drug Use: Yes    Special: Hydrocodone  . Sexual Activity: Yes    Birth Control/ Protection: IUD   Other Topics Concern  . Not on file   Social History Narrative    Past Surgical History  Procedure Laterality Date  . Cesarean section      Family History  Problem Relation Age of Onset  . Arthritis Mother   . Hypertension Mother   . Arthritis Father   . Colon cancer Paternal Uncle     No Known Allergies  Current Outpatient Prescriptions on File Prior to Visit  Medication Sig Dispense Refill  . albuterol (PROVENTIL HFA) 108 (90 BASE) MCG/ACT inhaler Inhale 2 puffs into the lungs every 6 (six) hours as needed. 1 Inhaler 4  . atorvastatin (LIPITOR) 20 MG tablet Take 1 tablet (20 mg total) by mouth daily. 30 tablet 4  . HYDROcodone-acetaminophen (NORCO)  10-325 MG per tablet Take 1 tablet by mouth every 8 (eight) hours as needed. 60 tablet 0  . losartan-hydrochlorothiazide (HYZAAR) 100-25 MG per tablet Take 1 tablet by mouth daily. 30 tablet 4  . senna (SENOKOT) 8.6 MG tablet Take 1 tablet by mouth 3 (three) times daily. 90 tablet 4  . venlafaxine XR (EFFEXOR-XR) 150 MG 24 hr capsule TAKE 1 CAPSULE BY MOUTH TWICE DAILY 60 capsule 2  . albuterol (PROVENTIL,VENTOLIN) 90 MCG/ACT inhaler Inhale 2 puffs into the lungs every 6 (six) hours as needed for wheezing. 17 g 1   No current facility-administered medications on file prior to visit.    BP 158/80 mmHg  Pulse 107  Temp(Src) 97.8 F (36.6 C) (Oral)  Wt 178 lb 11.2 oz (81.058 kg)chart  Review of Systems  Constitutional: Negative.  Negative for appetite change and fatigue.  HENT: Positive for sinus pressure.   Respiratory: Negative.   Cardiovascular: Negative.   Gastrointestinal: Negative.   Neurological: Negative.   Psychiatric/Behavioral: Negative.        Objective:   Physical Exam  Constitutional: She is oriented to person, place, and time. She appears well-developed and well-nourished. She is active.  HENT:  Head: Normocephalic.  Right Ear: Hearing, tympanic membrane, external ear  and ear canal normal.  Left Ear: Hearing, tympanic membrane, external ear and ear canal normal.  Nose: Nose normal.  Mouth/Throat: Uvula is midline, oropharynx is clear and moist and mucous membranes are normal.  Cardiovascular: Normal rate, regular rhythm and normal pulses.   Pulmonary/Chest: Effort normal and breath sounds normal.  Abdominal: Soft. Normal appearance and bowel sounds are normal.  Neurological: She is alert and oriented to person, place, and time.  Psychiatric: She has a normal mood and affect. Her speech is normal and behavior is normal. Thought content normal.          Assessment & Plan:  Thurston Holenne was seen today for follow-up.  Diagnoses and associated orders for this  visit:  ADD (attention deficit disorder) - amphetamine-dextroamphetamine (ADDERALL) 20 MG tablet; Take 1 tablet (20 mg total) by mouth 3 (three) times daily. - amphetamine-dextroamphetamine (ADDERALL) 20 MG tablet; Take 1 tablet (20 mg total) by mouth 3 (three) times daily.  Ear pressure, right  Other Orders - amphetamine-dextroamphetamine (ADDERALL) 20 MG tablet; Take 1 tablet (20 mg total) by mouth 3 (three) times daily.

## 2014-09-17 NOTE — Progress Notes (Signed)
Pre visit review using our clinic review tool, if applicable. No additional management support is needed unless otherwise documented below in the visit note. 

## 2014-09-17 NOTE — Patient Instructions (Signed)

## 2014-10-03 ENCOUNTER — Ambulatory Visit: Payer: Self-pay | Admitting: Family

## 2014-12-18 ENCOUNTER — Ambulatory Visit (INDEPENDENT_AMBULATORY_CARE_PROVIDER_SITE_OTHER): Payer: Self-pay | Admitting: Family

## 2014-12-18 ENCOUNTER — Encounter: Payer: Self-pay | Admitting: Family

## 2014-12-18 VITALS — BP 160/98 | HR 97 | Temp 98.7°F | Wt 183.9 lb

## 2014-12-18 DIAGNOSIS — M25562 Pain in left knee: Secondary | ICD-10-CM

## 2014-12-18 DIAGNOSIS — F988 Other specified behavioral and emotional disorders with onset usually occurring in childhood and adolescence: Secondary | ICD-10-CM

## 2014-12-18 DIAGNOSIS — F909 Attention-deficit hyperactivity disorder, unspecified type: Secondary | ICD-10-CM

## 2014-12-18 MED ORDER — AMPHETAMINE-DEXTROAMPHETAMINE 20 MG PO TABS: 20.0000 mg | ORAL_TABLET | Freq: Three times a day (TID) | ORAL | Status: DC

## 2014-12-18 MED ORDER — VENLAFAXINE HCL ER 150 MG PO CP24: 150.0000 mg | ORAL_CAPSULE | Freq: Two times a day (BID) | ORAL | Status: DC

## 2014-12-18 MED ORDER — PREDNISONE 20 MG PO TABS: 40.0000 mg | ORAL_TABLET | Freq: Every day | ORAL | Status: DC

## 2014-12-18 NOTE — Progress Notes (Signed)
Subjective:    Patient ID: Christina Cowan, female    DOB: 02-23-1961, 54 y.o.   MRN: [id number]  HPI  Medication follow-up Patient 54 year old, Caucasian female, seen today for medication follow-up.  Past medical history of ADD, Dysthymic disorder, hypertension, hypercholesterolemia, overweight, and current smoker. Denies any present problems regarding ADD, depressed mood, anxiety. Reports regular good sleep and denies fatigue.  Left knee pain Patient noticed pain in left several weeks ago after bending knee to clean a wall in home. She reports pain was temporarily relieved with heat, cold, and Aleve.  Patient reports that mild pain still continues to be bothersome, although it has not affected her ability to ambulate or perform daily activities.   Review of Systems  Constitutional: Negative.   HENT: Negative.   Eyes: Negative.   Respiratory: Negative.   Cardiovascular: Negative.   Gastrointestinal: Negative.   Endocrine: Negative.   Genitourinary: Negative.   Musculoskeletal: Positive for myalgias.       Left knee pain  Skin: Negative.   Allergic/Immunologic: Negative.   Neurological: Negative.   Hematological: Negative.   Psychiatric/Behavioral: Negative.        Objective:   Physical Exam  Constitutional: She is oriented to person, place, and time. She appears well-developed and well-nourished.  HENT:  Head: Normocephalic and atraumatic.  Right Ear: External ear normal.  Left Ear: External ear normal.  Nose: Nose normal.  Mouth/Throat: Oropharynx is clear and moist.  Eyes: Conjunctivae and EOM are normal. Pupils are equal, round, and reactive to light.  Neck: Normal range of motion. Neck supple.  Cardiovascular: Normal rate, regular rhythm, normal heart sounds and intact distal pulses.   Pulmonary/Chest: Effort normal and breath sounds normal.  Musculoskeletal: Normal range of motion.  Neurological: She is alert and oriented to person, place, and time. She has normal  reflexes.  Skin: Skin is warm and dry.  Psychiatric: She has a normal mood and affect. Her behavior is normal. Judgment and thought content normal.          Assessment & Plan:  .Thurston Holenne was seen today for follow-up.  Diagnoses and all orders for this visit:  ADD (attention deficit disorder) Orders: -     amphetamine-dextroamphetamine (ADDERALL) 20 MG tablet; Take 1 tablet (20 mg total) by mouth 3 (three) times daily. -     amphetamine-dextroamphetamine (ADDERALL) 20 MG tablet; Take 1 tablet (20 mg total) by mouth 3 (three) times daily.  Left knee pain  Other orders -     amphetamine-dextroamphetamine (ADDERALL) 20 MG tablet; Take 1 tablet (20 mg total) by mouth 3 (three) times daily. -     Discontinue: predniSONE (DELTASONE) 20 MG tablet; Take 2 tablets (40 mg total) by mouth daily with breakfast. -     Discontinue: venlafaxine XR (EFFEXOR-XR) 150 MG 24 hr capsule; Take 1 capsule (150 mg total) by mouth 2 (two) times daily. -     venlafaxine XR (EFFEXOR-XR) 150 MG 24 hr capsule; Take 1 capsule (150 mg total) by mouth 2 (two) times daily. -     predniSONE (DELTASONE) 20 MG tablet; Take 2 tablets (40 mg total) by mouth daily with breakfast.   Assessment: Patient suffered an injury to medial region of her left knee.  The area is absent of ecchymosis, edema, and erythema. She has full range of motion and mild tenderness present with palpation. The patient achieve some mild relief with taking NSAIDs and application of cold compresses which is likely related to an injury  involving inflammation.  ADD: Stable at present  Dysthymic disorder: Stable at present   Plan: Knee Pain:Prednisone 40 mg daily for five days for inflammation. ADD: Refilled Adderall 20 mg   Dysthymic disorder: Refilled Effexor 150 mg Follow up as needed.

## 2014-12-18 NOTE — Patient Instructions (Signed)
Knee Pain °Knee pain can be a result of an injury or other medical conditions. Treatment will depend on the cause of your pain. °HOME CARE °· Only take medicine as told by your doctor. °· Keep a healthy weight. Being overweight can make the knee hurt more. °· Stretch before exercising or playing sports. °· If there is constant knee pain, change the way you exercise. Ask your doctor for advice. °· Make sure shoes fit well. Choose the right shoe for the sport or activity. °· Protect your knees. Wear kneepads if needed. °· Rest when you are tired. °GET HELP RIGHT AWAY IF:  °· Your knee pain does not stop. °· Your knee pain does not get better. °· Your knee joint feels hot to the touch. °· You have a fever. °MAKE SURE YOU:  °· Understand these instructions. °· Will watch this condition. °· Will get help right away if you are not doing well or get worse. °Document Released: 11/11/2008 Document Revised: 11/07/2011 Document Reviewed: 11/11/2008 °ExitCare® Patient Information ©2015 [employer]. This information is not intended to replace advice given to you by your health care provider. Make sure you discuss any questions you have with your health care provider. ° °

## 2014-12-18 NOTE — Progress Notes (Signed)
Pre visit review using our clinic review tool, if applicable. No additional management support is needed unless otherwise documented below in the visit note. 

## 2014-12-22 ENCOUNTER — Encounter: Payer: Self-pay | Admitting: Family

## 2015-01-14 ENCOUNTER — Telehealth: Payer: Self-pay

## 2015-01-14 NOTE — Telephone Encounter (Signed)
No working Personnel officercontact #. Will send a letter

## 2015-01-31 ENCOUNTER — Other Ambulatory Visit: Payer: Self-pay | Admitting: Family

## 2015-03-09 ENCOUNTER — Ambulatory Visit: Payer: Self-pay | Admitting: Adult Health

## 2015-03-09 ENCOUNTER — Telehealth: Payer: Self-pay | Admitting: *Deleted

## 2015-03-09 DIAGNOSIS — Z0289 Encounter for other administrative examinations: Secondary | ICD-10-CM

## 2015-03-09 NOTE — Telephone Encounter (Signed)
Patient was No-Show to appointment at 2:00pm. Called and left voicemail for patient to call back.

## 2015-03-12 ENCOUNTER — Telehealth: Payer: Self-pay | Admitting: Family

## 2015-03-12 NOTE — Telephone Encounter (Signed)
Pls advise.  

## 2015-03-12 NOTE — Telephone Encounter (Signed)
Pt request refill of the following: amphetamine-dextroamphetamine (ADDERALL) 20 MG tablet  Pt said she was out of town and miss her est visit with Kandee Keenory. She has been rescheduled for Aug and is asking for a refill    Phamacy:

## 2015-03-13 ENCOUNTER — Telehealth: Payer: Self-pay | Admitting: Adult Health

## 2015-03-13 ENCOUNTER — Other Ambulatory Visit: Payer: Self-pay | Admitting: Family

## 2015-03-13 NOTE — Telephone Encounter (Signed)
Left message for patient to call back re: adderall

## 2015-03-13 NOTE — Telephone Encounter (Signed)
Spoke to Ms. Christina Cowan  I advised her that I did not write prescriptions for ADHD medication. I gave her the [facility], [facility], and [facility]

## 2015-03-13 NOTE — Telephone Encounter (Addendum)
Pt returning your call, can be reached at [phone]

## 2015-03-18 ENCOUNTER — Ambulatory Visit: Payer: Self-pay | Admitting: Family

## 2015-03-18 ENCOUNTER — Other Ambulatory Visit: Payer: Self-pay | Admitting: Family

## 2015-03-18 ENCOUNTER — Telehealth: Payer: Self-pay | Admitting: Family

## 2015-03-18 DIAGNOSIS — F988 Other specified behavioral and emotional disorders with onset usually occurring in childhood and adolescence: Secondary | ICD-10-CM

## 2015-03-18 MED ORDER — AMPHETAMINE-DEXTROAMPHETAMINE 20 MG PO TABS: 20.0000 mg | ORAL_TABLET | Freq: Three times a day (TID) | ORAL | Status: DC

## 2015-03-18 NOTE — Telephone Encounter (Signed)
Pt is aware rx up front °

## 2015-03-18 NOTE — Telephone Encounter (Signed)
Pt had to reschedule her appt until 03-27-15 and would like to know if [facility] will give her 1 month supply of generic adderall 20 mg.

## 2015-03-27 ENCOUNTER — Ambulatory Visit (INDEPENDENT_AMBULATORY_CARE_PROVIDER_SITE_OTHER): Payer: Self-pay | Admitting: Family

## 2015-03-27 ENCOUNTER — Encounter: Payer: Self-pay | Admitting: Family

## 2015-03-27 VITALS — BP 184/100 | HR 95 | Temp 97.9°F | Ht 63.75 in | Wt 181.4 lb

## 2015-03-27 DIAGNOSIS — Z1211 Encounter for screening for malignant neoplasm of colon: Secondary | ICD-10-CM

## 2015-03-27 DIAGNOSIS — I10 Essential (primary) hypertension: Secondary | ICD-10-CM

## 2015-03-27 DIAGNOSIS — F909 Attention-deficit hyperactivity disorder, unspecified type: Secondary | ICD-10-CM

## 2015-03-27 DIAGNOSIS — Z1239 Encounter for other screening for malignant neoplasm of breast: Secondary | ICD-10-CM

## 2015-03-27 DIAGNOSIS — F988 Other specified behavioral and emotional disorders with onset usually occurring in childhood and adolescence: Secondary | ICD-10-CM

## 2015-03-27 MED ORDER — AMPHETAMINE-DEXTROAMPHETAMINE 20 MG PO TABS: 20.0000 mg | ORAL_TABLET | Freq: Three times a day (TID) | ORAL | Status: DC

## 2015-03-27 MED ORDER — LOSARTAN POTASSIUM-HCTZ 100-25 MG PO TABS: 1.0000 | ORAL_TABLET | Freq: Every day | ORAL | Status: DC

## 2015-03-27 MED ORDER — VENLAFAXINE HCL ER 150 MG PO CP24: 150.0000 mg | ORAL_CAPSULE | Freq: Two times a day (BID) | ORAL | Status: DC

## 2015-03-27 MED ORDER — ATORVASTATIN CALCIUM 20 MG PO TABS: 20.0000 mg | ORAL_TABLET | Freq: Every day | ORAL | Status: DC

## 2015-03-27 NOTE — Progress Notes (Signed)
Pre visit review using our clinic review tool, if applicable. No additional management support is needed unless otherwise documented below in the visit note. 

## 2015-03-27 NOTE — Patient Instructions (Signed)

## 2015-03-27 NOTE — Progress Notes (Signed)
Subjective:    Patient ID: Christina Cowan, female    DOB: 05-12-1961, 54 y.o.   MRN: [id number]  HPI Comments: 54 year old Caucasian female here today for follow-up on medications.  Patient states that she doesn't have any complaints, but she needs refills on some of her medications.  States that she is not in any pain and had not had any incidents of shortness of breath.     Review of Systems  Constitutional: Negative.   HENT: Negative.   Eyes: Negative.   Respiratory: Negative.   Cardiovascular: Negative.   Gastrointestinal: Negative.   Genitourinary: Negative.   Musculoskeletal: Negative.   Skin: Negative.   Neurological: Negative.   Psychiatric/Behavioral: Negative.    Past Medical History  Diagnosis Date  . Asthma   . GERD (gastroesophageal reflux disease)   . Anxiety   . Arthritis   . Essential hypertension, benign 06/17/2014  . Pure hypercholesterolemia 06/17/2014    History   Social History  . Marital Status: Widowed    Spouse Name: N/A  . Number of Children: N/A  . Years of Education: N/A   Occupational History  . Not on file.   Social History Main Topics  . Smoking status: Current Every Day Smoker -- 0.80 packs/day    Types: Cigarettes  . Smokeless tobacco: Never Used  . Alcohol Use: 2.4 oz/week    4 Glasses of wine per week     Comment: occ  . Drug Use: Yes    Special: Hydrocodone  . Sexual Activity: Yes    Birth Control/ Protection: IUD   Other Topics Concern  . Not on file   Social History Narrative    Past Surgical History  Procedure Laterality Date  . Cesarean section      Family History  Problem Relation Age of Onset  . Arthritis Mother   . Hypertension Mother   . Arthritis Father   . Colon cancer Paternal Uncle     No Known Allergies  No current outpatient prescriptions on file prior to visit.   No current facility-administered medications on file prior to visit.    BP 184/100 mmHg  Pulse 95  Temp(Src) 97.9 F (36.6 C)  (Oral)  Ht 5' 3.75" (1.619 m)  Wt 181 lb 7 oz (82.3 kg)  BMI 31.40 kg/m2  SpO2 97%chart     Objective:   Physical Exam  Constitutional: She is oriented to person, place, and time. She appears well-developed and well-nourished.  HENT:  Head: Normocephalic.  Right Ear: External ear normal.  Left Ear: External ear normal.  Nose: Nose normal.  Mouth/Throat: Oropharynx is clear and moist.  Eyes: Conjunctivae and EOM are normal. Pupils are equal, round, and reactive to light.  Neck: Normal range of motion. Neck supple.  Cardiovascular: Normal rate, regular rhythm, normal heart sounds and intact distal pulses.   Pulmonary/Chest: Effort normal and breath sounds normal.  Abdominal: Soft. Bowel sounds are normal.  Musculoskeletal: Normal range of motion.  Neurological: She is alert and oriented to person, place, and time.  Skin: Skin is warm and dry.  Psychiatric: She has a normal mood and affect.          Assessment & Plan:  Diagnoses and all orders for this visit:  ADD (attention deficit disorder) Orders: -     Discontinue: amphetamine-dextroamphetamine (ADDERALL) 20 MG tablet; Take 1 tablet (20 mg total) by mouth 3 (three) times daily. -     amphetamine-dextroamphetamine (ADDERALL) 20 MG tablet; Take 1  tablet (20 mg total) by mouth 3 (three) times daily. -     amphetamine-dextroamphetamine (ADDERALL) 20 MG tablet; Take 1 tablet (20 mg total) by mouth 3 (three) times daily.  Breast cancer screening Orders: -     MM Digital Screening; Future  Colon cancer screening Orders: -     Ambulatory referral to Gastroenterology  Essential hypertension  Other orders -     venlafaxine XR (EFFEXOR-XR) 150 MG 24 hr capsule; Take 1 capsule (150 mg total) by mouth 2 (two) times daily. -     losartan-hydrochlorothiazide (HYZAAR) 100-25 MG per tablet; Take 1 tablet by mouth daily. -     atorvastatin (LIPITOR) 20 MG tablet; Take 1 tablet (20 mg total) by mouth daily.   Refills on  Adderrall, Lipitor, Hyzaar, and Effexor today.  Patient reminded to call for refill request on medication before she runs out of meds.  Patient to begin blood pressure diary and bring to next visit.  Patient educated on importance of monitoring blood pressures because of potential risks of other perscribed medications increasing BPs.  Patient educated on importance of regular exercise to help improve BPs.  Patient to call office with concerns as needed.

## 2015-04-15 ENCOUNTER — Ambulatory Visit: Payer: Self-pay | Admitting: Adult Health

## 2015-06-24 ENCOUNTER — Encounter: Payer: Self-pay | Admitting: Family

## 2015-06-24 ENCOUNTER — Ambulatory Visit (INDEPENDENT_AMBULATORY_CARE_PROVIDER_SITE_OTHER): Payer: Self-pay | Admitting: Family

## 2015-06-24 VITALS — BP 150/74 | HR 96 | Temp 99.0°F | Wt 178.0 lb

## 2015-06-24 DIAGNOSIS — E7800 Pure hypercholesterolemia, unspecified: Secondary | ICD-10-CM

## 2015-06-24 DIAGNOSIS — F909 Attention-deficit hyperactivity disorder, unspecified type: Secondary | ICD-10-CM

## 2015-06-24 DIAGNOSIS — I10 Essential (primary) hypertension: Secondary | ICD-10-CM

## 2015-06-24 DIAGNOSIS — F988 Other specified behavioral and emotional disorders with onset usually occurring in childhood and adolescence: Secondary | ICD-10-CM

## 2015-06-24 DIAGNOSIS — Z23 Encounter for immunization: Secondary | ICD-10-CM

## 2015-06-24 LAB — HEPATIC FUNCTION PANEL
ALBUMIN: 4.3 g/dL (ref 3.5–5.2)
ALT: 24 U/L (ref 0–35)
AST: 19 U/L (ref 0–37)
Alkaline Phosphatase: 81 U/L (ref 39–117)
Bilirubin, Direct: 0 mg/dL (ref 0.0–0.3)
TOTAL PROTEIN: 7.1 g/dL (ref 6.0–8.3)
Total Bilirubin: 0.3 mg/dL (ref 0.2–1.2)

## 2015-06-24 LAB — BASIC METABOLIC PANEL
BUN: 14 mg/dL (ref 6–23)
CALCIUM: 9.5 mg/dL (ref 8.4–10.5)
CHLORIDE: 103 meq/L (ref 96–112)
CO2: 30 meq/L (ref 19–32)
CREATININE: 0.97 mg/dL (ref 0.40–1.20)
GFR: 63.21 mL/min (ref >60.00)
GLUCOSE: 68 mg/dL — AB (ref 70–99)
Potassium: 4.3 mEq/L (ref 3.5–5.1)
Sodium: 143 mEq/L (ref 135–145)

## 2015-06-24 LAB — LIPID PANEL
CHOL/HDL RATIO: 5
Cholesterol: 272 mg/dL — ABNORMAL HIGH (ref 0–200)
HDL: 51.4 mg/dL (ref >39.00)
NONHDL: 220.5
TRIGLYCERIDES: 354 mg/dL — AB (ref 0.0–149.0)
VLDL: 70.8 mg/dL — ABNORMAL HIGH (ref 0.0–40.0)

## 2015-06-24 LAB — LDL CHOLESTEROL, DIRECT: LDL DIRECT: 155 mg/dL

## 2015-06-24 MED ORDER — ATORVASTATIN CALCIUM 20 MG PO TABS: 20.0000 mg | ORAL_TABLET | Freq: Every day | ORAL | Status: DC

## 2015-06-24 MED ORDER — AMPHETAMINE-DEXTROAMPHETAMINE 20 MG PO TABS: 20.0000 mg | ORAL_TABLET | Freq: Three times a day (TID) | ORAL | Status: DC

## 2015-06-24 MED ORDER — LOSARTAN POTASSIUM-HCTZ 100-25 MG PO TABS: 1.0000 | ORAL_TABLET | Freq: Every day | ORAL | Status: DC

## 2015-06-24 MED ORDER — VENLAFAXINE HCL ER 150 MG PO CP24: 150.0000 mg | ORAL_CAPSULE | Freq: Two times a day (BID) | ORAL | Status: DC

## 2015-06-24 NOTE — Progress Notes (Signed)
Subjective:    Patient ID: Christina Cowan, female    DOB: 11/18/1960, 54 y.o.   MRN: [id number]  HPI 54 year old white female, nonsmoker with a history of hypertension, depression, hyperlipidemia, attention deficit disorder is in today for recheck. Reports doing well. Requesting refills on medications. Denies any concerns.   Review of Systems  Constitutional: Negative.   HENT: Negative.   Respiratory: Negative.   Cardiovascular: Negative.   Gastrointestinal: Negative.   Endocrine: Negative.   Genitourinary: Negative.   Musculoskeletal: Negative.   Skin: Negative.   Allergic/Immunologic: Negative.   Neurological: Negative.   Hematological: Negative.   Psychiatric/Behavioral: Negative.    Past Medical History  Diagnosis Date  . Asthma   . GERD (gastroesophageal reflux disease)   . Anxiety   . Arthritis   . Essential hypertension, benign 06/17/2014  . Pure hypercholesterolemia 06/17/2014    Social History   Social History  . Marital Status: Widowed    Spouse Name: N/A  . Number of Children: N/A  . Years of Education: N/A   Occupational History  . Not on file.   Social History Main Topics  . Smoking status: Current Every Day Smoker -- 0.80 packs/day    Types: Cigarettes  . Smokeless tobacco: Never Used  . Alcohol Use: 2.4 oz/week    4 Glasses of wine per week     Comment: occ  . Drug Use: Yes    Special: Hydrocodone  . Sexual Activity: Yes    Birth Control/ Protection: IUD   Other Topics Concern  . Not on file   Social History Narrative    Past Surgical History  Procedure Laterality Date  . Cesarean section      Family History  Problem Relation Age of Onset  . Arthritis Mother   . Hypertension Mother   . Arthritis Father   . Colon cancer Paternal Uncle     No Known Allergies  No current outpatient prescriptions on file prior to visit.   No current facility-administered medications on file prior to visit.    BP 150/74 mmHg  Pulse 96   Temp(Src) 99 F (37.2 C)  Wt 178 lb (80.74 kg)chart    Objective:   Physical Exam  Constitutional: She is oriented to person, place, and time. She appears well-developed and well-nourished.  Neck: Normal range of motion. Neck supple. No thyromegaly present.  Cardiovascular: Normal rate, regular rhythm and normal heart sounds.   Pulmonary/Chest: Effort normal and breath sounds normal.  Abdominal: Soft. Bowel sounds are normal.  Musculoskeletal: Normal range of motion.  Neurological: She is alert and oriented to person, place, and time. She has normal reflexes. No cranial nerve deficit. Coordination normal.  Skin: Skin is warm and dry.  Psychiatric: She has a normal mood and affect.          Assessment & Plan:  Christina Cowan was seen today for f/u on meds.  Diagnoses and all orders for this visit:  Essential hypertension -     Lipid Panel -     Basic Metabolic Panel -     Hepatic Function Panel  Encounter for immunization  ADD (attention deficit disorder) -     Discontinue: amphetamine-dextroamphetamine (ADDERALL) 20 MG tablet; Take 1 tablet (20 mg total) by mouth 3 (three) times daily. -     amphetamine-dextroamphetamine (ADDERALL) 20 MG tablet; Take 1 tablet (20 mg total) by mouth 3 (three) times daily. -     amphetamine-dextroamphetamine (ADDERALL) 20 MG tablet; Take 1 tablet (20 mg  total) by mouth 3 (three) times daily.  Pure hypercholesterolemia -     Lipid Panel -     Basic Metabolic Panel -     Hepatic Function Panel  Other orders -     Flu Vaccine QUAD 36+ mos IM -     atorvastatin (LIPITOR) 20 MG tablet; Take 1 tablet (20 mg total) by mouth daily. -     losartan-hydrochlorothiazide (HYZAAR) 100-25 MG tablet; Take 1 tablet by mouth daily. -     venlafaxine XR (EFFEXOR-XR) 150 MG 24 hr capsule; Take 1 capsule (150 mg total) by mouth 2 (two) times daily.   Call the office with any questions or concerns. Recheck in 3 months and sooner as needed.

## 2015-06-24 NOTE — Patient Instructions (Signed)

## 2015-09-30 ENCOUNTER — Telehealth: Payer: Self-pay | Admitting: Family

## 2015-09-30 DIAGNOSIS — F988 Other specified behavioral and emotional disorders with onset usually occurring in childhood and adolescence: Secondary | ICD-10-CM

## 2015-09-30 MED ORDER — AMPHETAMINE-DEXTROAMPHETAMINE 20 MG PO TABS: 20.0000 mg | ORAL_TABLET | Freq: Three times a day (TID) | ORAL | Status: DC

## 2015-09-30 NOTE — Telephone Encounter (Signed)
Pt request refill of the following:  amphetamine-dextroamphetamine (ADDERALL) 20 MG tablet  Pt will get establish with Dr [place] in March she is a [facility] patient      Phamacy:

## 2015-09-30 NOTE — Telephone Encounter (Signed)
Refill once 

## 2015-09-30 NOTE — Telephone Encounter (Signed)
Rx is ready for pick up. Voicemail was left for the pt  

## 2015-09-30 NOTE — Telephone Encounter (Signed)
Pt last visit 06/24/15 Pt last Rx refill 06/24/15  Please advise

## 2015-11-16 ENCOUNTER — Other Ambulatory Visit: Payer: Self-pay | Admitting: Family

## 2015-11-16 ENCOUNTER — Other Ambulatory Visit: Payer: Self-pay | Admitting: Family Medicine

## 2015-11-19 ENCOUNTER — Encounter: Payer: Self-pay | Admitting: Family Medicine

## 2015-11-19 ENCOUNTER — Ambulatory Visit: Payer: Self-pay | Admitting: Family Medicine

## 2015-12-02 ENCOUNTER — Ambulatory Visit (INDEPENDENT_AMBULATORY_CARE_PROVIDER_SITE_OTHER): Payer: Self-pay | Admitting: Family Medicine

## 2015-12-02 ENCOUNTER — Encounter: Payer: Self-pay | Admitting: Family Medicine

## 2015-12-02 VITALS — BP 156/96 | HR 93 | Temp 98.0°F | Ht 63.0 in | Wt 177.1 lb

## 2015-12-02 DIAGNOSIS — I10 Essential (primary) hypertension: Secondary | ICD-10-CM

## 2015-12-02 DIAGNOSIS — E7800 Pure hypercholesterolemia, unspecified: Secondary | ICD-10-CM

## 2015-12-02 DIAGNOSIS — F909 Attention-deficit hyperactivity disorder, unspecified type: Secondary | ICD-10-CM

## 2015-12-02 DIAGNOSIS — F341 Dysthymic disorder: Secondary | ICD-10-CM

## 2015-12-02 DIAGNOSIS — F988 Other specified behavioral and emotional disorders with onset usually occurring in childhood and adolescence: Secondary | ICD-10-CM

## 2015-12-02 MED ORDER — LOSARTAN POTASSIUM-HCTZ 100-25 MG PO TABS: 1.0000 | ORAL_TABLET | Freq: Every day | ORAL | Status: DC

## 2015-12-02 MED ORDER — VENLAFAXINE HCL ER 150 MG PO CP24: 150.0000 mg | ORAL_CAPSULE | Freq: Two times a day (BID) | ORAL | Status: DC

## 2015-12-02 MED ORDER — AMPHETAMINE-DEXTROAMPHETAMINE 20 MG PO TABS: 20.0000 mg | ORAL_TABLET | Freq: Three times a day (TID) | ORAL | Status: DC

## 2015-12-02 NOTE — Patient Instructions (Signed)
Please see information for ADD clinic evaluation. After you have completed the evaluation, medication will be provided based upon the results and recommendation of specialist.  Please monitor your BP at home for 2-4 weeks and return to clinic for BP follow up and physical exam with lab work.  DASH Eating Plan DASH stands for "Dietary Approaches to Stop Hypertension." The DASH eating plan is a healthy eating plan that has been shown to reduce high blood pressure (hypertension). Additional health benefits may include reducing the risk of type 2 diabetes mellitus, heart disease, and stroke. The DASH eating plan may also help with weight loss. WHAT DO I NEED TO KNOW ABOUT THE DASH EATING PLAN? For the DASH eating plan, you will follow these general guidelines:  Choose foods with a percent daily value for sodium of less than 5% (as listed on the food label).  Use salt-free seasonings or herbs instead of table salt or sea salt.  Check with your health care provider or pharmacist before using salt substitutes.  Eat lower-sodium products, often labeled as "lower sodium" or "no salt added."  Eat fresh foods.  Eat more vegetables, fruits, and low-fat dairy products.  Choose whole grains. Look for the word "whole" as the first word in the ingredient list.  Choose fish and skinless chicken or [place] more often than red meat. Limit fish, poultry, and meat to 6 oz (170 g) each day.  Limit sweets, desserts, sugars, and sugary drinks.  Choose heart-healthy fats.  Limit cheese to 1 oz (28 g) per day.  Eat more home-cooked food and less restaurant, buffet, and fast food.  Limit fried foods.  Cook foods using methods other than frying.  Limit canned vegetables. If you do use them, rinse them well to decrease the sodium.  When eating at a restaurant, ask that your food be prepared with less salt, or no salt if possible. WHAT FOODS CAN I EAT? Seek help from a dietitian for individual calorie  needs. Grains Whole grain or whole wheat bread. Brown rice. Whole grain or whole wheat pasta. Quinoa, bulgur, and whole grain cereals. Low-sodium cereals. Corn or whole wheat flour tortillas. Whole grain cornbread. Whole grain crackers. Low-sodium crackers. Vegetables Fresh or frozen vegetables (raw, steamed, roasted, or grilled). Low-sodium or reduced-sodium tomato and vegetable juices. Low-sodium or reduced-sodium tomato sauce and paste. Low-sodium or reduced-sodium canned vegetables.  Fruits All fresh, canned (in natural juice), or frozen fruits. Meat and Other Protein Products Ground beef (85% or leaner), grass-fed beef, or beef trimmed of fat. Skinless chicken or [place]. Ground chicken or [place]. Pork trimmed of fat. All fish and seafood. Eggs. Dried beans, peas, or lentils. Unsalted nuts and seeds. Unsalted canned beans. Dairy Low-fat dairy products, such as skim or 1% milk, 2% or reduced-fat cheeses, low-fat ricotta or cottage cheese, or plain low-fat yogurt. Low-sodium or reduced-sodium cheeses. Fats and Oils Tub margarines without trans fats. Light or reduced-fat mayonnaise and salad dressings (reduced sodium). Avocado. Safflower, olive, or canola oils. Natural peanut or almond butter. Other Unsalted popcorn and pretzels. The items listed above may not be a complete list of recommended foods or beverages. Contact your dietitian for more options. WHAT FOODS ARE NOT RECOMMENDED? Grains White bread. White pasta. White rice. Refined cornbread. Bagels and croissants. Crackers that contain trans fat. Vegetables Creamed or fried vegetables. Vegetables in a cheese sauce. Regular canned vegetables. Regular canned tomato sauce and paste. Regular tomato and vegetable juices. Fruits Dried fruits. Canned fruit in light or heavy syrup.  Fruit juice. Meat and Other Protein Products Fatty cuts of meat. Ribs, chicken wings, bacon, sausage, bologna, salami, chitterlings, fatback, hot dogs, bratwurst,  and packaged luncheon meats. Salted nuts and seeds. Canned beans with salt. Dairy Whole or 2% milk, cream, half-and-half, and cream cheese. Whole-fat or sweetened yogurt. Full-fat cheeses or blue cheese. Nondairy creamers and whipped toppings. Processed cheese, cheese spreads, or cheese curds. Condiments Onion and garlic salt, seasoned salt, table salt, and sea salt. Canned and packaged gravies. Worcestershire sauce. Tartar sauce. Barbecue sauce. Teriyaki sauce. Soy sauce, including reduced sodium. Steak sauce. Fish sauce. Oyster sauce. Cocktail sauce. Horseradish. Ketchup and mustard. Meat flavorings and tenderizers. Bouillon cubes. Hot sauce. Tabasco sauce. Marinades. Taco seasonings. Relishes. Fats and Oils Butter, stick margarine, lard, shortening, ghee, and bacon fat. Coconut, palm kernel, or palm oils. Regular salad dressings. Other Pickles and olives. Salted popcorn and pretzels. The items listed above may not be a complete list of foods and beverages to avoid. Contact your dietitian for more information. WHERE CAN I FIND MORE INFORMATION? National Heart, Lung, and Blood Institute: travelstabloid.com   This information is not intended to replace advice given to you by your health care provider. Make sure you discuss any questions you have with your health care provider.   Document Released: 08/04/2011 Document Revised: 09/05/2014 Document Reviewed: 06/19/2013 Elsevier Interactive Patient Education [employer].

## 2015-12-02 NOTE — Progress Notes (Signed)
   Subjective:    Patient ID: Christina BinetAnne Luz, female    DOB: 1961/01/03, 55 y.o.   MRN: [id number]  HPI     Review of Systems     Objective:   Physical Exam        Assessment & Plan:

## 2015-12-02 NOTE — Progress Notes (Signed)
Pre visit review using our clinic review tool, if applicable. No additional management support is needed unless otherwise documented below in the visit note. 

## 2015-12-02 NOTE — Progress Notes (Signed)
Patient ID: Christina Cowan, female   DOB: July 02, 1961, 55 y.o.   MRN: [id number]   Patient presents to clinic today to establish care.  Acute Concerns: Hypertension has been an ongoing concern and she is currently taking hyzaar daily. Home monitoring occurs 3 x week and is noted with systolic averages in the 140s and diastolic averages 90s.  Denies chest pain, palpations, SOB, headaches, or nosebleeds.   Chronic Issues: ADD: Patient states that she hs not taken this medication in 2-3 weeks as she did not have any medication left. She notes improvement with focus and attention with adderrall. Evaluation for ADD has been at least 8 years ago per patient and she has not been reevaluated since that time.  Dysthymic disorder: Denies suicide ideation or plan. Patient discussed losing husband 6 years ago and mother 2 years ago. She states that her Effexor has helped her and she notes that sadness and lack of energy are noted if she does not take Effexor.  Hypercholesterolemia: Patient currently taking atorvastatin as prescribed. Previous lab results were obtained in October 2016 and patient states that she was not taking her atorvastatin as prescribed but has since started this medication again. She denies myalgias or adverse effects from this medication.  Health Maintenance: Dental --Every 6 months Vision -- Needs vision check Immunizations --UTD Colonoscopy -- Needs preventive screening Mammogram -- Needs mammogram, will see gynecology  PAP -- Needs mammogram, will see gynecology    Review of Systems  Constitutional: Negative for fever, chills and weight loss.  HENT: Negative for congestion, hearing loss, sore throat and tinnitus.   Eyes: Negative for blurred vision, double vision and photophobia.  Respiratory: Negative for cough, shortness of breath and wheezing.   Cardiovascular: Negative for chest pain, palpitations, orthopnea, claudication and leg swelling.  Gastrointestinal: Negative for  heartburn, nausea, vomiting, abdominal pain, diarrhea and blood in stool.  Genitourinary: Negative for dysuria, urgency, frequency, hematuria and flank pain.  Musculoskeletal: Negative for myalgias and back pain.  Neurological: Negative for dizziness, tingling and headaches.  Endo/Heme/Allergies: Does not bruise/bleed easily.  Psychiatric/Behavioral: Negative for suicidal ideas. The patient is not nervous/anxious and does not have insomnia.        Denies depressed or anxious mood while taking effexor   Past Medical History  Diagnosis Date  . Asthma   . GERD (gastroesophageal reflux disease)   . Anxiety   . Arthritis   . Essential hypertension, benign 06/17/2014  . Pure hypercholesterolemia 06/17/2014    Social History   Social History  . Marital Status: Widowed    Spouse Name: N/A  . Number of Children: N/A  . Years of Education: N/A   Occupational History  . Not on file.   Social History Main Topics  . Smoking status: Current Every Day Smoker -- 0.40 packs/day for 16 years    Types: Cigarettes  . Smokeless tobacco: Never Used  . Alcohol Use: 2.4 oz/week    4 Glasses of wine per week     Comment: occ  . Drug Use: Yes    Special: Hydrocodone  . Sexual Activity: Yes    Birth Control/ Protection: IUD   Other Topics Concern  . Not on file   Social History Narrative    Past Surgical History  Procedure Laterality Date  . Cesarean section      Family History  Problem Relation Age of Onset  . Arthritis Mother   . Hypertension Mother   . Arthritis Father   . Colon cancer  Paternal Uncle     No Known Allergies  Current Outpatient Prescriptions on File Prior to Visit  Medication Sig Dispense Refill  . atorvastatin (LIPITOR) 20 MG tablet Take 1 tablet (20 mg total) by mouth daily. 90 tablet 1   No current facility-administered medications on file prior to visit.    BP 156/96 mmHg  Pulse 93  Temp(Src) 98 F (36.7 C) (Oral)  Ht  (1.6 m)  Wt 177 lb 1.6  oz (80.332 kg)  BMI 31.38 kg/m2    Physical Exam  Constitutional: She is oriented to person, place, and time and well-developed, well-nourished, and in no distress.  HENT:  Right Ear: Tympanic membrane normal.  Left Ear: Tympanic membrane normal.  Nose: No rhinorrhea.  Mouth/Throat: Mucous membranes are normal. No oropharyngeal exudate or posterior oropharyngeal erythema.  Eyes: Pupils are equal, round, and reactive to light.  Neck: Normal range of motion.  Cardiovascular: Normal rate and regular rhythm.  Exam reveals no gallop and no friction rub.   No murmur heard. Pulmonary/Chest: Effort normal and breath sounds normal. She has no wheezes. She has no rales.  Abdominal: Soft. Bowel sounds are normal. She exhibits no distension. There is no tenderness.  Musculoskeletal: She exhibits no edema.  Lymphadenopathy:    She has no cervical adenopathy.  Neurological: She is alert and oriented to person, place, and time. Coordination normal.  Skin: Skin is warm and dry. No rash noted.  Psychiatric: Mood, memory, affect and judgment normal.    Assessment/Plan:  1. Essential hypertension - losartan-hydrochlorothiazide (HYZAAR) 100-25 MG tablet; Take 1 tablet by mouth daily.  Dispense: 90 tablet; Refill: 1 Advised patient to monitor BP at home and RTC 2-4 weeks for BP follow up and physical exam with routine lab work.  DASH diet plan advised for this patient. Written materials provided.  2. ADD (attention deficit disorder)  Advised patient that she will receive 1 month of adderall so she can establish an appointment for an evaluation of her ADD with the ADD clinic. Address and phone number provided to patient so she can set up this appointment. If this evaluation does not occur, she will not receive any additional prescriptions for this medication. Additionally, if medication is provided in the future, she will need to be evaluated every 6 months. Patient voiced understanding and agreed with  plan.   - amphetamine-dextroamphetamine (ADDERALL) 20 MG tablet; Take 1 tablet (20 mg total) by mouth 3 (three) times daily.  Dispense: 90 tablet; Refill: 0  3. Pure hypercholesterolemia Continue atorvastatin as prescribed and follow up within 2-4 weeks for a physical and routine lab work.    4. Dysthymic disorder Discussed with patient the ability to add counseling with her medication to address symptoms of depression and anxiety.  - venlafaxine XR (EFFEXOR-XR) 150 MG 24 hr capsule; Take 1 capsule (150 mg total) by mouth 2 (two) times daily.  Dispense: 60 capsule; Refill: 3  Advised patient to follow up for physical in 2-4 weeks and for further evaluation of BP and medication management. Patient is also going to establish care with a gynecologist to remove current Mirena IUD and for routine gyn care.

## 2015-12-29 ENCOUNTER — Telehealth: Payer: Self-pay | Admitting: Family Medicine

## 2015-12-29 NOTE — Telephone Encounter (Signed)
Okay to refill for one month 

## 2015-12-29 NOTE — Telephone Encounter (Signed)
Pt request refill of the following: amphetamine-dextroamphetamine (ADDERALL) 20 MG tablet ° ° °Phamacy: ° °

## 2015-12-29 NOTE — Telephone Encounter (Signed)
Patient last seen on 12/02/15. Patient states she has appointment on May 15th with ADD [facility]. Okay to refill?

## 2015-12-30 ENCOUNTER — Other Ambulatory Visit: Payer: Self-pay | Admitting: *Deleted

## 2015-12-30 DIAGNOSIS — F988 Other specified behavioral and emotional disorders with onset usually occurring in childhood and adolescence: Secondary | ICD-10-CM

## 2015-12-30 MED ORDER — AMPHETAMINE-DEXTROAMPHETAMINE 20 MG PO TABS: 20.0000 mg | ORAL_TABLET | Freq: Three times a day (TID) | ORAL | Status: DC

## 2015-12-30 NOTE — Telephone Encounter (Signed)
Printed and patient is aware rx is upfront ready for pick up

## 2016-01-11 ENCOUNTER — Ambulatory Visit: Payer: Self-pay | Admitting: Family Medicine

## 2016-01-12 ENCOUNTER — Other Ambulatory Visit: Payer: Self-pay | Admitting: Family

## 2016-02-04 NOTE — Telephone Encounter (Signed)
FILLED FOR 4 MONTHS ON 12/02/2015.  REFILLS SHOULD BE ON FILE

## 2016-02-18 ENCOUNTER — Ambulatory Visit (INDEPENDENT_AMBULATORY_CARE_PROVIDER_SITE_OTHER): Payer: Self-pay | Admitting: Family Medicine

## 2016-02-18 ENCOUNTER — Encounter: Payer: Self-pay | Admitting: Family Medicine

## 2016-02-18 VITALS — BP 142/96 | HR 86 | Temp 97.5°F | Ht 63.0 in | Wt 178.9 lb

## 2016-02-18 DIAGNOSIS — F988 Other specified behavioral and emotional disorders with onset usually occurring in childhood and adolescence: Secondary | ICD-10-CM

## 2016-02-18 DIAGNOSIS — F341 Dysthymic disorder: Secondary | ICD-10-CM

## 2016-02-18 DIAGNOSIS — I10 Essential (primary) hypertension: Secondary | ICD-10-CM

## 2016-02-18 DIAGNOSIS — E785 Hyperlipidemia, unspecified: Secondary | ICD-10-CM

## 2016-02-18 DIAGNOSIS — F909 Attention-deficit hyperactivity disorder, unspecified type: Secondary | ICD-10-CM

## 2016-02-18 LAB — CBC WITH DIFFERENTIAL/PLATELET
BASOS ABS: 0 10*3/uL (ref 0.0–0.1)
BASOS PCT: 0.6 % (ref 0.0–3.0)
EOS PCT: 1.7 % (ref 0.0–5.0)
Eosinophils Absolute: 0.1 10*3/uL (ref 0.0–0.7)
HEMATOCRIT: 40.3 % (ref 36.0–46.0)
Hemoglobin: 13.5 g/dL (ref 12.0–15.0)
LYMPHS PCT: 36.3 % (ref 12.0–46.0)
Lymphs Abs: 2.6 10*3/uL (ref 0.7–4.0)
MCHC: 33.5 g/dL (ref 30.0–36.0)
MCV: 89.6 fl (ref 78.0–100.0)
MONO ABS: 0.3 10*3/uL (ref 0.1–1.0)
Monocytes Relative: 4.8 % (ref 3.0–12.0)
NEUTROS ABS: 4.1 10*3/uL (ref 1.4–7.7)
NEUTROS PCT: 56.6 % (ref 43.0–77.0)
PLATELETS: 291 10*3/uL (ref 150.0–400.0)
RBC: 4.5 Mil/uL (ref 3.87–5.11)
RDW: 14.5 % (ref 11.5–15.5)
WBC: 7.3 10*3/uL (ref 4.0–10.5)

## 2016-02-18 LAB — HEPATIC FUNCTION PANEL
ALT: 15 U/L (ref 0–35)
AST: 15 U/L (ref 0–37)
Albumin: 4.3 g/dL (ref 3.5–5.2)
Alkaline Phosphatase: 75 U/L (ref 39–117)
BILIRUBIN DIRECT: 0.1 mg/dL (ref 0.0–0.3)
BILIRUBIN TOTAL: 0.4 mg/dL (ref 0.2–1.2)
Total Protein: 6.9 g/dL (ref 6.0–8.3)

## 2016-02-18 LAB — BASIC METABOLIC PANEL
BUN: 12 mg/dL (ref 6–23)
CALCIUM: 9.2 mg/dL (ref 8.4–10.5)
CHLORIDE: 99 meq/L (ref 96–112)
CO2: 32 mEq/L (ref 19–32)
CREATININE: 0.77 mg/dL (ref 0.40–1.20)
GFR: 82.32 mL/min (ref >60.00)
Glucose, Bld: 92 mg/dL (ref 70–99)
Potassium: 3.6 mEq/L (ref 3.5–5.1)
SODIUM: 138 meq/L (ref 135–145)

## 2016-02-18 LAB — LIPID PANEL
Cholesterol: 299 mg/dL — ABNORMAL HIGH (ref 0–200)
HDL: 59.9 mg/dL (ref >39.00)
NONHDL: 239.56
Total CHOL/HDL Ratio: 5
Triglycerides: 385 mg/dL — ABNORMAL HIGH (ref 0.0–149.0)
VLDL: 77 mg/dL — ABNORMAL HIGH (ref 0.0–40.0)

## 2016-02-18 LAB — LDL CHOLESTEROL, DIRECT: Direct LDL: 189 mg/dL

## 2016-02-18 MED ORDER — AMPHETAMINE-DEXTROAMPHETAMINE 20 MG PO TABS: 20.0000 mg | ORAL_TABLET | Freq: Two times a day (BID) | ORAL | Status: DC

## 2016-02-18 MED ORDER — VENLAFAXINE HCL ER 150 MG PO CP24: 150.0000 mg | ORAL_CAPSULE | Freq: Every day | ORAL | Status: DC

## 2016-02-18 NOTE — Progress Notes (Signed)
Subjective:    Patient ID: Christina Cowan, female    DOB: 05-08-1961, 55 y.o.   MRN: [id number]  HPI  Christina Cowan is a 55 year old female who presents today for follow up for ADD, hypertension, and dysthymic disorder.  ADD: Patient reports taking adderall when working to assist with focus and completing her activities in a timely manner. 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 She reports taking adderall 20 mg BID instead of TID. She reports that this medication improves her focus and helps her complete tasks needed for work.  She denies adverse effects and specifically chest pain, palpitations, tachycardia, and insomnia.  HTN: Monitors BP at home twice/day at least 3 to 4 times/week.  She did not bring home BP in today and does not have her cuff with her however reports systolic averages of 130s and diastolic averages in the 70s. Currently, taking Hyzaar daily without adverse effects. Recently began exercising  with walking three times/day for 20 minutes without cardiopulmonary symptoms. Diet:  Does not monitor salt intake and reports cooking at home 2 to 3 times/week and describes eating fast food once/week.  She has a desire to focus on diet and exercise to control BP and avoid additional medications. Retake of BP is 142/96. Believe first BP elevation was related to improper cuff size   Hyperlipidemia:  Atorvastatin 20 mg daily as prescribed. She denies myalgias or adverse effects from medication. Last lab results were obtained in 2016 and she reports that at that time she was not taking her atorvastatin as prescribed.  Dysthymic disorder:  Currently taking venlafaxine XR 150 mg once daily  that provides excellent benefit for her.  She denies depressed or anxious mood.   Denies suicide ideation or plan. She reports that medication has helped her with her mood and lack of energy.    Review of Systems  Constitutional: Negative for fever, chills  and fatigue.  HENT: Negative for congestion, rhinorrhea, sinus pressure, sneezing and sore throat.   Respiratory: Negative for cough, shortness of breath and wheezing.   Cardiovascular: Negative for chest pain and palpitations.  Gastrointestinal: Negative for nausea, vomiting, abdominal pain, diarrhea and constipation.  Genitourinary: Negative for dysuria, urgency, frequency and hematuria.  Musculoskeletal: Negative for myalgias and arthralgias.  Skin: Negative for rash.  Neurological: Negative for dizziness, tremors, seizures, speech difficulty, light-headedness, numbness and headaches.  Psychiatric/Behavioral:       Denies depressed or anxious mood   Past Medical History  Diagnosis Date  . Asthma   . GERD (gastroesophageal reflux disease)   . Anxiety   . Arthritis   . Essential hypertension, benign 06/17/2014  . Pure hypercholesterolemia 06/17/2014     Social History   Social History  . Marital Status: Widowed    Spouse Name: N/A  . Number of Children: N/A  . Years of Education: N/A   Occupational History  . Not on file.   Social History Main Topics  . Smoking status: Current Every Day Smoker -- 0.40 packs/day for 16 years    Types: Cigarettes  . Smokeless tobacco: Never Used  . Alcohol Use: 2.4 oz/week    4 Glasses of wine per week     Comment: occ  . Drug Use: Yes    Special: Hydrocodone  . Sexual Activity: Yes    Birth Control/ Protection: IUD   Other Topics Concern  . Not on file   Social History Narrative    Past Surgical  History  Procedure Laterality Date  . Cesarean section      Family History  Problem Relation Age of Onset  . Arthritis Mother   . Hypertension Mother   . Arthritis Father   . Colon cancer Paternal Uncle     No Known Allergies  Current Outpatient Prescriptions on File Prior to Visit  Medication Sig Dispense Refill  . atorvastatin (LIPITOR) 20 MG tablet Take 1 tablet (20 mg total) by mouth daily. 90 tablet 1  .  losartan-hydrochlorothiazide (HYZAAR) 100-25 MG tablet Take 1 tablet by mouth daily. 90 tablet 1   No current facility-administered medications on file prior to visit.    BP 142/96 mmHg  Pulse 86  Temp(Src) 97.5 F (36.4 C) (Oral)  Ht 5\' 3"  (1.6 m)  Wt 178 lb 14.4 oz (81.149 kg)  BMI 31.70 kg/m2  SpO2 97%       Objective:   Physical Exam  Constitutional: She is oriented to person, place, and time. She appears well-developed and well-nourished.  HENT:  Right Ear: Tympanic membrane normal.  Left Ear: Tympanic membrane normal.  Nose: No rhinorrhea. Right sinus exhibits no maxillary sinus tenderness and no frontal sinus tenderness. Left sinus exhibits no maxillary sinus tenderness and no frontal sinus tenderness.  Mouth/Throat: Mucous membranes are normal. No oropharyngeal exudate or posterior oropharyngeal erythema.  Eyes: Pupils are equal, round, and reactive to light. No scleral icterus.  Neck: Neck supple.  Cardiovascular: Normal rate, regular rhythm and intact distal pulses.   Pulmonary/Chest: Effort normal and breath sounds normal. She has no wheezes. She has no rales.  Abdominal: Soft. Bowel sounds are normal. There is no tenderness.  Musculoskeletal: She exhibits no edema.  Lymphadenopathy:    She has no cervical adenopathy.  Neurological: She is alert and oriented to person, place, and time.  Skin: Skin is warm and dry. No rash noted.  Psychiatric: She has a normal mood and affect. Her behavior is normal. Judgment and thought content normal.          Assessment & Plan:  1. Essential hypertension Continue Hyzaar as prescribed. Highly encouraged DASH diet with written instructions provided.  Also advised patient to monitor BP at home and follow up in 2 weeks with her readings and blood pressure cuff for a nurse visit to evaluate her BP. Suspect that first BP noted was due to improper cuff size  - CBC with Differential/Platelet - Basic metabolic panel - Hepatic  function panel  2. Hyperlipidemia Continue atorvastatin as prescribed and lab work will be obtained today for effectiveness.  Highly encouraged DASH diet with a modified heart healthy approach and exercise to improve lipids.  - Lipid panel  3. ADD (attention deficit disorder) Advised patient that she will need to contact behavioral health or [facility] to receive further refills on her adderall.  Also discussed that she will be prescribed this BID and not TID as >40 mg daily does not provide additional benefit.  Obtained Controlled Substances Contract today.  Reviewed the agreement and provided an opportunity for questions to be addressed.  Also stated that she will need to be seen every 6 months for evaluation if she would like to continue adderall. Patient voiced understanding and agreed with plan. - amphetamine-dextroamphetamine (ADDERALL) 20 MG tablet; Take 1 tablet (20 mg total) by mouth 2 (two) times daily.  Dispense: 90 tablet; Refill: 0  4. Dysthymic disorder Discussed the addition of counseling if needed  to address symptoms of depression and anxiety. - venlafaxine XR (EFFEXOR-XR) 150 MG 24 hr capsule; Take 1 capsule (150 mg total) by mouth daily with breakfast.  Dispense: 30 capsule; Refill: 3  Roddie Mc, FNP-C

## 2016-02-18 NOTE — Progress Notes (Signed)
Pre visit review using our clinic review tool, if applicable. No additional management support is needed unless otherwise documented below in the visit note. 

## 2016-02-18 NOTE — Patient Instructions (Addendum)
We have ordered labs or studies at this visit. It can take up to 1-2 weeks for results and processing. IF results require follow up or explanation, we will call you with instructions. Clinically stable results will be released to your [facility]. If you have not heard from us or cannot find your results in [facility] in 2 weeks please contact our office at [phone].  If you are not yet signed up for [facility], please consider signing up   Please follow up in 2 weeks for a Blood pressure check with a nurse in our office.   DASH Eating Plan DASH stands for "Dietary Approaches to Stop Hypertension." The DASH eating plan is a healthy eating plan that has been shown to reduce high blood pressure (hypertension). Additional health benefits may include reducing the risk of type 2 diabetes mellitus, heart disease, and stroke. The DASH eating plan may also help with weight loss. WHAT DO I NEED TO KNOW ABOUT THE DASH EATING PLAN? For the DASH eating plan, you will follow these general guidelines:  Choose foods with a percent daily value for sodium of less than 5% (as listed on the food label).  Use salt-free seasonings or herbs instead of table salt or sea salt.  Check with your health care provider or pharmacist before using salt substitutes.  Eat lower-sodium products, often labeled as "lower sodium" or "no salt added."  Eat fresh foods.  Eat more vegetables, fruits, and low-fat dairy products.  Choose whole grains. Look for the word "whole" as the first word in the ingredient list.  Choose fish and skinless chicken or [place] more often than red meat. Limit fish, poultry, and meat to 6 oz (170 g) each day.  Limit sweets, desserts, sugars, and sugary drinks.  Choose heart-healthy fats.  Limit cheese to 1 oz (28 g) per day.  Eat more home-cooked food and less restaurant, buffet, and fast food.  Limit fried foods.  Cook foods using methods other than frying.  Limit canned vegetables. If you  do use them, rinse them well to decrease the sodium.  When eating at a restaurant, ask that your food be prepared with less salt, or no salt if possible. WHAT FOODS CAN I EAT? Seek help from a dietitian for individual calorie needs. Grains Whole grain or whole wheat bread. Brown rice. Whole grain or whole wheat pasta. Quinoa, bulgur, and whole grain cereals. Low-sodium cereals. Corn or whole wheat flour tortillas. Whole grain cornbread. Whole grain crackers. Low-sodium crackers. Vegetables Fresh or frozen vegetables (raw, steamed, roasted, or grilled). Low-sodium or reduced-sodium tomato and vegetable juices. Low-sodium or reduced-sodium tomato sauce and paste. Low-sodium or reduced-sodium canned vegetables.  Fruits All fresh, canned (in natural juice), or frozen fruits. Meat and Other Protein Products Ground beef (85% or leaner), grass-fed beef, or beef trimmed of fat. Skinless chicken or [place]key. Ground chicken or [place]key. Pork trimmed of fat. All fish and seafood. Eggs. Dried beans, peas, or lentils. Unsalted nuts and seeds. Unsalted canned beans. Dairy Low-fat dairy products, such as skim or 1% milk, 2% or reduced-fat cheeses, low-fat ricotta or cottage cheese, or plain low-fat yogurt. Low-sodium or reduced-sodium cheeses. Fats and Oils Tub margarines without trans fats. Light or reduced-fat mayonnaise and salad dressings (reduced sodium). Avocado. Safflower, olive, or canola oils. Natural peanut or almond butter. Other Unsalted popcorn and pretzels. The items listed above may not be a complete list of recommended foods or beverages. Contact your dietitian for more options. WHAT FOODS ARE NOT RECOMMENDED? Grains  White bread. White pasta. White rice. Refined cornbread. Bagels and croissants. Crackers that contain trans fat. Vegetables Creamed or fried vegetables. Vegetables in a cheese sauce. Regular canned vegetables. Regular canned tomato sauce and paste. Regular tomato and vegetable  juices. Fruits Dried fruits. Canned fruit in light or heavy syrup. Fruit juice. Meat and Other Protein Products Fatty cuts of meat. Ribs, chicken wings, bacon, sausage, bologna, salami, chitterlings, fatback, hot dogs, bratwurst, and packaged luncheon meats. Salted nuts and seeds. Canned beans with salt. Dairy Whole or 2% milk, cream, half-and-half, and cream cheese. Whole-fat or sweetened yogurt. Full-fat cheeses or blue cheese. Nondairy creamers and whipped toppings. Processed cheese, cheese spreads, or cheese curds. Condiments Onion and garlic salt, seasoned salt, table salt, and sea salt. Canned and packaged gravies. Worcestershire sauce. Tartar sauce. Barbecue sauce. Teriyaki sauce. Soy sauce, including reduced sodium. Steak sauce. Fish sauce. Oyster sauce. Cocktail sauce. Horseradish. Ketchup and mustard. Meat flavorings and tenderizers. Bouillon cubes. Hot sauce. Tabasco sauce. Marinades. Taco seasonings. Relishes. Fats and Oils Butter, stick margarine, lard, shortening, ghee, and bacon fat. Coconut, palm kernel, or palm oils. Regular salad dressings. Other Pickles and olives. Salted popcorn and pretzels. The items listed above may not be a complete list of foods and beverages to avoid. Contact your dietitian for more information. WHERE CAN I FIND MORE INFORMATION? National Heart, Lung, and Blood Institute: CablePromo.itwww.nhlbi.nih.gov/health/health-topics/topics/dash/   This information is not intended to replace advice given to you by your health care provider. Make sure you discuss any questions you have with your health care provider.   Document Released: 08/04/2011 Document Revised: 09/05/2014 Document Reviewed: 06/19/2013 Elsevier Interactive Patient Education [employer]2016 Elsevier Inc.

## 2016-02-22 ENCOUNTER — Telehealth: Payer: Self-pay

## 2016-02-22 NOTE — Telephone Encounter (Signed)
Called patient to give lab results. No answer. 

## 2016-02-22 NOTE — Telephone Encounter (Signed)
-----   Message from Roddie McJulia Kordsmeier, FNP sent at 02/19/2016  7:04 AM EDT ----- Cholesterol level is too high and has increased from 8 months ago. Please verify if atorvastatin is taken on a daily basis. If so, the dose will need to increase to 40 mg instead of 20 mg and level will need to be rechecked in 6 weeks.

## 2016-02-23 NOTE — Telephone Encounter (Signed)
This encounter was created in error - please disregard.

## 2016-02-23 NOTE — Telephone Encounter (Signed)
Called patient again.  No answer.

## 2016-02-26 NOTE — Telephone Encounter (Signed)
No answer

## 2016-02-26 NOTE — Telephone Encounter (Signed)
This encounter was created in error - please disregard.

## 2016-03-04 NOTE — Telephone Encounter (Signed)
Sent My-Chart message

## 2016-05-16 ENCOUNTER — Other Ambulatory Visit: Payer: Self-pay | Admitting: Internal Medicine

## 2016-05-16 DIAGNOSIS — F341 Dysthymic disorder: Secondary | ICD-10-CM

## 2016-07-29 ENCOUNTER — Other Ambulatory Visit: Payer: Self-pay | Admitting: Family Medicine

## 2016-07-29 DIAGNOSIS — F341 Dysthymic disorder: Secondary | ICD-10-CM

## 2016-08-01 ENCOUNTER — Ambulatory Visit (INDEPENDENT_AMBULATORY_CARE_PROVIDER_SITE_OTHER): Payer: Self-pay | Admitting: Family Medicine

## 2016-08-01 ENCOUNTER — Encounter: Payer: Self-pay | Admitting: Family Medicine

## 2016-08-01 VITALS — BP 148/92 | HR 83 | Temp 98.1°F | Wt 180.0 lb

## 2016-08-01 DIAGNOSIS — F988 Other specified behavioral and emotional disorders with onset usually occurring in childhood and adolescence: Secondary | ICD-10-CM

## 2016-08-01 DIAGNOSIS — I10 Essential (primary) hypertension: Secondary | ICD-10-CM

## 2016-08-01 DIAGNOSIS — Z23 Encounter for immunization: Secondary | ICD-10-CM

## 2016-08-01 DIAGNOSIS — F341 Dysthymic disorder: Secondary | ICD-10-CM

## 2016-08-01 NOTE — Progress Notes (Signed)
Pre visit review using our clinic review tool, if applicable. No additional management support is needed unless otherwise documented below in the visit note. 

## 2016-08-01 NOTE — Progress Notes (Signed)
Subjective:    Patient ID: Christina Cowan, female    DOB: Jul 14, 1961, 55 y.o.   MRN: [id number]  HPI  Christina Cowan is a 55 year old female who is here today for ADD, hypertension, and dysthymic disorder.  ADD:  Patient has initiated contact for evaluation of ADD with behavioral health. Christina Cowan reports that Adderall has provided excellent benefit previously to focus for work activities and caring for her elderly father.  Christina Cowan denies adverse effects and specifically chest pain, palpitations, tachycardia, and insomnia. Christina Cowan has not completed evaluation for ADD at this time and we discussed previously that medication will only be provided after evaluation of need has been completed.  HTN: Monitors BP at home with systolic averages of 130s and diastolic averages in the 80s.  Christina Cowan reports exercise daily with walking her dog for exercise for 20 to 30 minutes without cardiopulmonary symptoms. Christina Cowan reports eating a low salt diet with a modified healthy heart diet. Christina Cowan reports increased stress due to caring for her father who is elderly father and recent strain in her relationship with her sister.  Hyzaar daily has been continued without adverse effects.   Retake of BP 148/92. Patient took medication just prior to this visit.  Dysthymic disorder: Currently taking venlafaxine XR 150 mg once daily provides excellent benefit for her.  Christina Cowan denies depressed or anxious mood today. Denies suicidal ideation or plan.  Medication has improved her mood, energy, and has assisted her when dealing with "stressful situations"  Review of Systems  Constitutional: Negative for chills, fatigue and fever.  Eyes: Negative for visual disturbance.  Respiratory: Negative for cough, shortness of breath and wheezing.   Cardiovascular: Negative for chest pain and palpitations.  Gastrointestinal: Negative for abdominal pain, diarrhea, nausea and vomiting.  Genitourinary: Negative for dyspareunia, hematuria and urgency.  Musculoskeletal:  Negative for myalgias.  Skin: Negative for rash.  Neurological: Negative for dizziness, light-headedness and headaches.   Past Medical History:  Diagnosis Date  . Anxiety   . Arthritis   . Asthma   . Essential hypertension, benign 06/17/2014  . GERD (gastroesophageal reflux disease)   . Pure hypercholesterolemia 06/17/2014     Social History   Social History  . Marital status: Widowed    Spouse name: N/A  . Number of children: N/A  . Years of education: N/A   Occupational History  . Not on file.   Social History Main Topics  . Smoking status: Current Every Day Smoker    Packs/day: 0.40    Years: 16.00    Types: Cigarettes  . Smokeless tobacco: Never Used  . Alcohol use 2.4 oz/week    4 Glasses of wine per week     Comment: occ  . Drug use:     Types: Hydrocodone  . Sexual activity: Yes    Birth control/ protection: IUD   Other Topics Concern  . Not on file   Social History Narrative  . No narrative on file    Past Surgical History:  Procedure Laterality Date  . CESAREAN SECTION      Family History  Problem Relation Age of Onset  . Arthritis Mother   . Hypertension Mother   . Arthritis Father   . Colon cancer Paternal Uncle     No Known Allergies  Current Outpatient Prescriptions on File Prior to Visit  Medication Sig Dispense Refill  . amphetamine-dextroamphetamine (ADDERALL) 20 MG tablet Take 1 tablet (20 mg total) by mouth 2 (two) times daily. 90 tablet 0  .  atorvastatin (LIPITOR) 20 MG tablet Take 1 tablet (20 mg total) by mouth daily. 90 tablet 1  . losartan-hydrochlorothiazide (HYZAAR) 100-25 MG tablet Take 1 tablet by mouth daily. 90 tablet 1  . venlafaxine XR (EFFEXOR-XR) 150 MG 24 hr capsule Take 1 capsule (150 mg total) by mouth daily with breakfast. 30 capsule 3  . venlafaxine XR (EFFEXOR-XR) 150 MG 24 hr capsule TAKE 1 CAPSULE(150 MG) BY MOUTH TWICE DAILY 60 capsule 2   No current facility-administered medications on file prior to visit.      BP (!) 148/92   Pulse 83   Temp 98.1 F (36.7 C) (Oral)   Wt 180 lb (81.6 kg)   SpO2 93%   BMI 31.89 kg/m        Objective:   Physical Exam  Constitutional: Christina Cowan is oriented to person, place, and time. Christina Cowan appears well-developed and well-nourished.  Eyes: Pupils are equal, round, and reactive to light.  Neck: Neck supple.  Cardiovascular: Normal rate and regular rhythm.   Pulmonary/Chest: Effort normal and breath sounds normal. Christina Cowan has no wheezes. Christina Cowan exhibits no tenderness.  Abdominal: Soft. Bowel sounds are normal. There is no tenderness.  Musculoskeletal: Christina Cowan exhibits no edema.  Lymphadenopathy:    Christina Cowan has no cervical adenopathy.  Neurological: Christina Cowan is alert and oriented to person, place, and time. Coordination normal.  Skin: Skin is warm and dry. No rash noted.  Psychiatric: Christina Cowan has a normal mood and affect. Her behavior is normal. Judgment and thought content normal.       Assessment & Plan:  Essential HTN:  Retake of BP 148/92. Patient recently took her medication just prior to this appointment. Advised her to monitor her BP at home, bring readings and home cuff with her in 1 to 2 weeks for a nurse visit for a blood pressure recheck. If readings are elevated, will add amlodipine to her current regimen. Patient voiced understanding and wishes to monitor for 1 to 2 weeks before addition of medication.    Attention deficit disorder, unspecified hyperactivity presence Discussed with patient the importance of evaluation for need of medications such as adderrall. Christina Cowan has not been taking this medication since late summer. Christina Cowan is currently not employed and is taking care of her elderly father. Adverse effects of this medication were discussed and with patient's HTN, lack of medication need for outside employment, and lack of evaluation for need at this time; no Adderral was provided. Christina Cowan voiced understanding and has agreed to seek evaluation for ADD and medication in the future.  Christina Cowan agreed with plan.   Dysthymic Disorder: Well controlled; continue venlafaxine as prescribed. We also discussed that venlafaxine may be increasing BP and we may consider other options if BP is elevated.  Follow up in 1 to 2 weeks with blood pressure cuff and readings.   Roddie McJulia Jonika Critz, FNP-C

## 2016-08-01 NOTE — Patient Instructions (Signed)
Please monitor blood pressure and follow up readings. You can contact office to make a nurse visit appointment for blood pressure check. It is a good idea to bring your blood pressure cuff with you so we can check it with our readings.  Minimal Blood Pressure Goal= AVERAGE < 140/90; Ideal is an AVERAGE < 135/85. This AVERAGE should be calculated from @ least 5-7 BP readings taken @ different times of day on different days of week. You should not respond to isolated BP readings , but rather the AVERAGE for that week .Please bring your blood pressure cuff to office visits to verify that it is reliable.It can also be checked against the blood pressure device at the pharmacy. Finger or wrist cuffs are not dependable; an arm cuff is.  Please keep me updated regarding your evaluation for ADD as we discussed.  Also, below is information regarding a low salt diet that is recommended for decreasing blood pressure.  DASH Eating Plan DASH stands for "Dietary Approaches to Stop Hypertension." The DASH eating plan is a healthy eating plan that has been shown to reduce high blood pressure (hypertension). Additional health benefits may include reducing the risk of type 2 diabetes mellitus, heart disease, and stroke. The DASH eating plan may also help with weight loss. What do I need to know about the DASH eating plan? For the DASH eating plan, you will follow these general guidelines:  Choose foods with less than 150 milligrams of sodium per serving (as listed on the food label).  Use salt-free seasonings or herbs instead of table salt or sea salt.  Check with your health care provider or pharmacist before using salt substitutes.  Eat lower-sodium products. These are often labeled as "low-sodium" or "no salt added."  Eat fresh foods. Avoid eating a lot of canned foods.  Eat more vegetables, fruits, and low-fat dairy products.  Choose whole grains. Look for the word "whole" as the first word in the  ingredient list.  Choose fish and skinless chicken or Malaw[place]urkey more often than red meat. Limit fish, poultry, and meat to 6 oz (170 g) each day.  Limit sweets, desserts, sugars, and sugary drinks.  Choose heart-healthy fats.  Eat more home-cooked food and less restaurant, buffet, and fast food.  Limit fried foods.  Do not fry foods. Cook foods using methods such as baking, boiling, grilling, and broiling instead.  When eating at a restaurant, ask that your food be prepared with less salt, or no salt if possible. What foods can I eat? Seek help from a dietitian for individual calorie needs. Grains  Whole grain or whole wheat bread. Brown rice. Whole grain or whole wheat pasta. Quinoa, bulgur, and whole grain cereals. Low-sodium cereals. Corn or whole wheat flour tortillas. Whole grain cornbread. Whole grain crackers. Low-sodium crackers. Vegetables  Fresh or frozen vegetables (raw, steamed, roasted, or grilled). Low-sodium or reduced-sodium tomato and vegetable juices. Low-sodium or reduced-sodium tomato sauce and paste. Low-sodium or reduced-sodium canned vegetables. Fruits  All fresh, canned (in natural juice), or frozen fruits. Meat and Other Protein Products  Ground beef (85% or leaner), grass-fed beef, or beef trimmed of fat. Skinless chicken or Malaw[place]urkey. Ground chicken or Mal[place]urkey. Pork trimmed of fat. All fish and seafood. Eggs. Dried beans, peas, or lentils. Unsalted nuts and seeds. Unsalted canned beans. Dairy  Low-fat dairy products, such as skim or 1% milk, 2% or reduced-fat cheeses, low-fat ricotta or cottage cheese, or plain low-fat yogurt. Low-sodium or reduced-sodium cheeses. Fats and Oils  Tub margarines without trans fats. Light or reduced-fat mayonnaise and salad dressings (reduced sodium). Avocado. Safflower, olive, or canola oils. Natural peanut or almond butter. Other  Unsalted popcorn and pretzels. The items listed above may not be a complete list of recommended foods or  beverages. Contact your dietitian for more options.  What foods are not recommended? Grains  White bread. White pasta. White rice. Refined cornbread. Bagels and croissants. Crackers that contain trans fat. Vegetables  Creamed or fried vegetables. Vegetables in a cheese sauce. Regular canned vegetables. Regular canned tomato sauce and paste. Regular tomato and vegetable juices. Fruits  Canned fruit in light or heavy syrup. Fruit juice. Meat and Other Protein Products  Fatty cuts of meat. Ribs, chicken wings, bacon, sausage, bologna, salami, chitterlings, fatback, hot dogs, bratwurst, and packaged luncheon meats. Salted nuts and seeds. Canned beans with salt. Dairy  Whole or 2% milk, cream, half-and-half, and cream cheese. Whole-fat or sweetened yogurt. Full-fat cheeses or blue cheese. Nondairy creamers and whipped toppings. Processed cheese, cheese spreads, or cheese curds. Condiments  Onion and garlic salt, seasoned salt, table salt, and sea salt. Canned and packaged gravies. Worcestershire sauce. Tartar sauce. Barbecue sauce. Teriyaki sauce. Soy sauce, including reduced sodium. Steak sauce. Fish sauce. Oyster sauce. Cocktail sauce. Horseradish. Ketchup and mustard. Meat flavorings and tenderizers. Bouillon cubes. Hot sauce. Tabasco sauce. Marinades. Taco seasonings. Relishes. Fats and Oils  Butter, stick margarine, lard, shortening, ghee, and bacon fat. Coconut, palm kernel, or palm oils. Regular salad dressings. Other  Pickles and olives. Salted popcorn and pretzels. The items listed above may not be a complete list of foods and beverages to avoid. Contact your dietitian for more information.  Where can I find more information? National Heart, Lung, and Blood Institute: CablePromo.itwww.nhlbi.nih.gov/health/health-topics/topics/dash/ This information is not intended to replace advice given to you by your health care provider. Make sure you discuss any questions you have with your health care  provider. Document Released: 08/04/2011 Document Revised: 01/21/2016 Document Reviewed: 06/19/2013 Elsevier Interactive Patient Education  2017 [employer]

## 2016-11-03 ENCOUNTER — Other Ambulatory Visit: Payer: Self-pay | Admitting: Family Medicine

## 2016-12-02 ENCOUNTER — Other Ambulatory Visit: Payer: Self-pay | Admitting: Family Medicine

## 2016-12-02 DIAGNOSIS — I10 Essential (primary) hypertension: Secondary | ICD-10-CM

## 2017-01-10 ENCOUNTER — Other Ambulatory Visit: Payer: Self-pay | Admitting: Family Medicine

## 2017-01-12 ENCOUNTER — Other Ambulatory Visit: Payer: Self-pay | Admitting: Family Medicine

## 2017-01-12 NOTE — Telephone Encounter (Signed)
Can you please resend venlafaxine 150 mg for #60 instead of #30 pt state that she takes 2 a day.

## 2017-01-13 NOTE — Telephone Encounter (Signed)
Pharmacy is calling needing for the Rx to be resent for venlafaxine XR 150 mg they did not receive the 1st one.

## 2017-01-13 NOTE — Telephone Encounter (Signed)
Rx was sent to the pharmacy #60 with 1 refill.

## 2017-01-13 NOTE — Telephone Encounter (Signed)
Patient has enough to last her 1 month. Will refill before she is out. #30 with 1 refill was provided to her pharmacy.

## 2017-03-21 ENCOUNTER — Other Ambulatory Visit: Payer: Self-pay | Admitting: Family Medicine

## 2017-04-20 ENCOUNTER — Other Ambulatory Visit: Payer: Self-pay | Admitting: Family Medicine

## 2017-04-21 MED ORDER — VENLAFAXINE HCL ER 225 MG PO TB24: 1.0000 | ORAL_TABLET | Freq: Every day | ORAL | 2 refills | Status: DC

## 2017-04-21 NOTE — Telephone Encounter (Signed)
Per Amil Amen she sent pt's rx [facility] but pt wanted this to go to [facility]. I have re-sent rx to [facility] and called and verbally spoke with pharmacist Casimiro Needle and canceled rx at [facility]. Left pt vm that rx was at [facility].

## 2017-04-21 NOTE — Telephone Encounter (Signed)
Lmom tcb x1 to speak with pt about Julia's message

## 2017-04-21 NOTE — Telephone Encounter (Signed)
Please call patient and verify if she is taking this medication twice/daily. Last prescription was for venlafaxine XR 150 mg daily.

## 2017-04-21 NOTE — Telephone Encounter (Signed)
Spoke with patient by phone to review medication adherence. At her previous visits, we discussed that the venlafaxine XR 150 mg was to be taken once daily. She reports that she was advised to use this medication BID by a previous provider and states that she misunderstood that the XR version was different than immediate release. Advised patient that she will be provided with venlafaxine 225 mg XR once daily today and this medication is at the maximum dose and should be taken once daily. Further advised her that if she is interested in being evaluated by psychiatry for dosing, a list of providers can be given to her. Advised her that she is due for an office visit for evaluation of chronic conditions; she agreed to make an appointment today.

## 2017-04-21 NOTE — Telephone Encounter (Signed)
Christina Cowan,  Patient returned my call and she states she is taking twice day.

## 2017-04-21 NOTE — Telephone Encounter (Signed)
Please advise on refill request Last filled 03/21/17 QTY #60 RF #0 Last OV 12/17 No pending OVs

## 2017-04-24 ENCOUNTER — Encounter: Payer: Self-pay | Admitting: Family Medicine

## 2017-04-26 ENCOUNTER — Encounter: Payer: Self-pay | Admitting: Family Medicine

## 2017-04-28 ENCOUNTER — Telehealth: Payer: Self-pay | Admitting: Family Medicine

## 2017-04-28 MED ORDER — VENLAFAXINE HCL ER 75 MG PO CP24: 225.0000 mg | ORAL_CAPSULE | Freq: Every day | ORAL | 3 refills | Status: DC

## 2017-04-28 NOTE — Telephone Encounter (Signed)
Sent to the pharmacy by e-scribe.  Pt notified to pick up at the pharmacy. 

## 2017-04-28 NOTE — Telephone Encounter (Signed)
° °  Pt said she can not afford the below med   225 mg cost 311.00    Venlafaxine HCl 225 MG TB24   Pt said if she can get a rx for 75mg  then she can take 3 tablets and get 90 pills a month and it will cost 21.00  Pharmacy  [facility] [address]

## 2017-04-28 NOTE — Telephone Encounter (Signed)
Please send for 75 mg tablets as requested due to cost for patient

## 2017-05-18 ENCOUNTER — Encounter: Payer: Self-pay | Admitting: Family Medicine

## 2017-08-16 ENCOUNTER — Telehealth: Payer: Self-pay | Admitting: Family Medicine

## 2017-08-16 MED ORDER — VENLAFAXINE HCL ER 75 MG PO CP24: 225.0000 mg | ORAL_CAPSULE | Freq: Every day | ORAL | 0 refills | Status: DC

## 2017-08-16 NOTE — Telephone Encounter (Signed)
Dr. Earlene PlaterWallace - Roddie McJulia Kordsmeier, NP will not be in any office until after the first of the year so she is not available to approve refill. Would you be willing to refill until pt has OV with you? Thanks!

## 2017-08-16 NOTE — Telephone Encounter (Signed)
Okay temporary refill.

## 2017-08-16 NOTE — Telephone Encounter (Signed)
Prescription has been sent to the pharmacy. 

## 2017-08-16 NOTE — Telephone Encounter (Signed)
Copied from CRM [phone]. Topic: Quick Communication - Rx Refill/Question >> Aug 16, 2017 11:30 AM Crist InfanteHarrald, Kathy J wrote: Has the patient contacted their pharmacy? yes  Pt has [facility], but needs a 30 day supply  venlafaxine XR (EFFEXOR XR) 75 MG 24 hr capsule  (pt takes 3 in the am)  To get her through if Raynelle FanningJulie would do for her.  [id number] [place] - [id number] AT [facility] OF [address] & [facility] [address] [phone] (Phone) [phone] (Fax)

## 2017-09-08 ENCOUNTER — Ambulatory Visit: Payer: Self-pay | Admitting: Family Medicine

## 2017-09-08 DIAGNOSIS — Z0289 Encounter for other administrative examinations: Secondary | ICD-10-CM

## 2017-09-17 ENCOUNTER — Other Ambulatory Visit: Payer: Self-pay | Admitting: Family Medicine

## 2017-09-19 MED ORDER — VENLAFAXINE HCL ER 75 MG PO CP24: 225.0000 mg | ORAL_CAPSULE | Freq: Every day | ORAL | 0 refills | Status: DC

## 2017-09-29 ENCOUNTER — Encounter: Payer: Self-pay | Admitting: Family Medicine

## 2017-10-17 ENCOUNTER — Other Ambulatory Visit: Payer: Self-pay | Admitting: Internal Medicine

## 2017-10-17 NOTE — Telephone Encounter (Signed)
Okay to refill temporarily. She was advised to establish care last year and has not been seen. She also had an established care appointment in January, however I do not see where she was seen. She will need a visit with a provider for future refills.

## 2017-10-17 NOTE — Telephone Encounter (Signed)
Refilled: 09/19/2017   Last OV: 08/01/2016 Next OV: not scheduled

## 2017-10-18 ENCOUNTER — Telehealth: Payer: Self-pay | Admitting: Family Medicine

## 2017-10-18 NOTE — Telephone Encounter (Signed)
Copied from CRM [phone]. Topic: Quick Communication - See Telephone Encounter >> Oct 18, 2017  3:01 PM Arlyss Gandyichardson, Carlin Attridge N, NT wrote: CRM for notification. See Telephone encounter for: Pt needing a refill of venlafaxine XR (EFFEXOR XR. [facility] [address]. Has contacted pharmacy. Will be establishing care at [facility] in April but [facility] pt [facility]  10/18/17.

## 2017-10-18 NOTE — Telephone Encounter (Signed)
Pt requesting refill of Venlafaxine XR to be [facility] to [facility] [address]Former pt of Delbert HarnessJulie Kordsmeier, [place] at [place] [facility] and is  establishing care with Dr. Earlene PlaterWallace on 12/12/17.  Last refill 09/19/17

## 2017-10-19 NOTE — Telephone Encounter (Signed)
Script was filled today by Dr. Darrick Huntsmanullo

## 2017-10-19 NOTE — Telephone Encounter (Signed)
This patient is still considered a Roddie McKordsmeier, Julia, FNP patient until 12/12/17. See note below.

## 2017-11-13 ENCOUNTER — Other Ambulatory Visit: Payer: Self-pay | Admitting: Internal Medicine

## 2017-11-15 ENCOUNTER — Other Ambulatory Visit: Payer: Self-pay | Admitting: Internal Medicine

## 2017-11-17 ENCOUNTER — Other Ambulatory Visit: Payer: Self-pay | Admitting: Family Medicine

## 2017-11-17 NOTE — Telephone Encounter (Signed)
Copied from CRM [id number]. Topic: Quick Communication - Rx Refill/Question >> Nov 17, 2017 12:50 PM Rudi CocoLathan, Aitana Burry M, NT wrote: Medication: venlafaxine XR (EFFEXOR-XR) 75 MG 24 hr capsule [[id number]][[id number]]  Has the patient contacted their pharmacy? yes (Agent: If no, request that the patient contact the pharmacy for the refill.) Preferred Pharmacy (with phone number or street name): [id number] [place] - [phone] AT [facility] OF [address] [facility] [address] [place] [address] Phone: [phone] Fax: [phone]   Agent: Please be advised that RX refills may take up to 3 business days. We ask that you follow-up with your pharmacy.

## 2017-11-17 NOTE — Telephone Encounter (Signed)
[facility]. Dr. Lendon KaKordsmeier. Please advise

## 2017-11-22 NOTE — Telephone Encounter (Signed)
Pt. Is out of medication.   venlafaxine XR (EFFEXOR-XR) 75 MG 24 hr capsule  Dr. Darrick Huntsmanullo prescribe this last time

## 2017-11-23 MED ORDER — VENLAFAXINE HCL ER 75 MG PO CP24: ORAL_CAPSULE | ORAL | 0 refills | Status: DC

## 2017-11-23 NOTE — Telephone Encounter (Signed)
Patient called in checking on the status of the prescription.  She said she is completely out and she hasn't taken it since Sunday, Jan 19, 2018.  So she is in desperate need.

## 2017-11-23 NOTE — Addendum Note (Signed)
Addended by: Dennie BibleAVIS, KATHY R on: 11/23/2017 02:52 PM   Modules accepted: Orders

## 2017-11-23 NOTE — Telephone Encounter (Signed)
Okay to temporary refill until she is established with Dr. Earlene PlaterWallace.

## 2017-11-23 NOTE — Telephone Encounter (Signed)
Patient advised of result and verbalized an understanding.  

## 2017-11-23 NOTE — Telephone Encounter (Signed)
Patient calling again, requesting refill asap, patient is out. Please advise. Call back [phone]

## 2017-11-23 NOTE — Telephone Encounter (Signed)
30 day supply sent to pharmacy. Patient notified.

## 2017-11-23 NOTE — Telephone Encounter (Signed)
Pt called to to f/u on status of RX and verify pharmacy as  [id number] [place] - [address] AT [facility] [address] & [facility] [address] [phone] (Phone) [phone] (Fax)

## 2017-12-11 NOTE — Progress Notes (Signed)
Christina Cowan is a 57 y.o. female is here TO ESTABLISH CARE.  History of Present Illness:   Christina Cowan, CMA acting as scribe for Dr. Helane Cowan.   HPI: Patient in office to establish care. She needs refills on medications. She also is having some abdominal pain with fever after eating sea food at the beach last week. She states that she is till having watery stools that "smell like C-diff."  She does have apatite but is afraid to eat. She states that stomach is making noise but no particular place hurts. She does have remember having a colonoscopy several years ago that was normal.   IUD Removal: She has had over 10 years and would like to have removed. Informed that we are getting information on places that she can have removed.   Blood pressure: She is currently taking her medications. She plans on increasing exercise and working on weight loss.   Health Maintenance Due  Topic Date Due  . Hepatitis C Screening  01/07/1961  . MAMMOGRAM  10/19/2009  . COLON CANCER SCREENING ANNUAL FOBT  10/19/2009  . PAP SMEAR  07/14/2015   Depression screen PHQ 2/9 12/02/2015  Decreased Interest 0  Down, Depressed, Hopeless 0  PHQ - 2 Score 0   PMHx, SurgHx, SocialHx, FamHx, Medications, and Allergies were reviewed in the Visit Navigator and updated as appropriate.   Patient Active Problem List   Diagnosis Date Noted  . Essential hypertension, benign 06/17/2014  . Pure hypercholesterolemia 06/17/2014  . Shingles rash 03/18/2014  . ADD (attention deficit disorder) 03/18/2014  . HYPOKALEMIA 06/18/2010  . OVERWEIGHT 05/28/2010  . GERD 12/16/2009  . CONGENITAL PES PLANUS 08/04/2009  . VITAMIN D DEFICIENCY 12/24/2008  . MENORRHAGIA, PERIMENOPAUSAL 10/01/2008  . BACK PAIN 01/31/2008  . Dysthymic disorder 07/24/2007  . ADD 07/24/2007   Social History   Tobacco Use  . Smoking status: Current Every Day Smoker    Packs/day: 0.40    Years: 16.00    Pack years: 6.40    Types: Cigarettes    . Smokeless tobacco: Never Used  Substance Use Topics  . Alcohol use: Yes    Alcohol/week: 2.4 oz    Types: 4 Glasses of wine per week    Comment: occ  . Drug use: Yes    Types: Hydrocodone   Current Medications and Allergies:   .  atorvastatin (LIPITOR) 20 MG tablet, Take 1 tablet (20 mg total) by mouth daily., Disp: 90 tablet, Rfl: 1 .  losartan-hydrochlorothiazide (HYZAAR) 100-25 MG tablet, TAKE ONE TABLET BY MOUTH DAILY, Disp: 30 tablet, Rfl: 3 .  venlafaxine XR (EFFEXOR-XR) 75 MG 24 hr capsule, TAKE 3 CAPSULES(225 MG) BY MOUTH DAILY WITH BREAKFAST, Disp: 90 capsule, Rfl: 0  No Known Allergies   Review of Systems   Pertinent items are noted in the HPI. Otherwise, ROS is negative.  Vitals:   Vitals:   12/12/17 1050  BP: (!) 144/84  Pulse: 96  Temp: 97.8 F (36.6 C)  TempSrc: Oral  SpO2: 97%  Weight: 179 lb (81.2 kg)  Height: 5\' 4"  (1.626 m)     Body mass index is 30.73 kg/m.   Physical Exam:   Physical Exam  Constitutional: She is oriented to person, place, and time. She appears well-developed and well-nourished. No distress.  HENT:  Head: Normocephalic and atraumatic.  Right Ear: External ear normal.  Left Ear: External ear normal.  Nose: Nose normal.  Mouth/Throat: Oropharynx is clear and moist.  Eyes: Pupils are  equal, round, and reactive to light. Conjunctivae and EOM are normal.  Neck: Normal range of motion. Neck supple. No thyromegaly present.  Cardiovascular: Normal rate, regular rhythm, normal heart sounds and intact distal pulses.  Pulmonary/Chest: Effort normal and breath sounds normal.  Abdominal: Soft. Bowel sounds are normal.  Musculoskeletal: Normal range of motion.  Lymphadenopathy:    She has no cervical adenopathy.  Neurological: She is alert and oriented to person, place, and time.  Skin: Skin is warm and dry. Capillary refill takes less than 2 seconds.  Psychiatric: She has a normal mood and affect. Her behavior is normal.  Nursing  note and vitals reviewed.   Assessment and Plan:   Christina Cowan was seen today for establish care.  Diagnoses and all orders for this visit:  Mixed hyperlipidemia Comments: Refill medications today.  Due for recheck. Orders: -     Lipid panel  Essential hypertension Comments: Okay refill of medications today.  Discussed weight loss versus increasing or changing medications.  Will wait until recheck. Orders: -     losartan-hydrochlorothiazide (HYZAAR) 100-25 MG tablet; Take 1 tablet by mouth daily. -     Comprehensive metabolic panel -     CBC with Differential/Platelet  Diarrhea of presumed infectious origin Comments: Patient is self-pay.  We will treat based on symptoms.  Red flags reviewed. Orders: -     Lipase -     Hemoccult Cards (X3 cards); Future  Obesity (BMI 30-39.9) Comments: Reviewed healthy food choices and exercise.  Other orders -     atorvastatin (LIPITOR) 20 MG tablet; Take 1 tablet (20 mg total) by mouth daily. -     venlafaxine XR (EFFEXOR-XR) 75 MG 24 hr capsule; TAKE 3 CAPSULES(225 MG) BY MOUTH DAILY WITH BREAKFAST -     metroNIDAZOLE (FLAGYL) 500 MG tablet; Take 1 tablet (500 mg total) by mouth 3 (three) times daily. -     LDL cholesterol, direct    . Reviewed expectations re: course of current medical issues. . Discussed self-management of symptoms. . Outlined signs and symptoms indicating need for more acute intervention. . Patient verbalized understanding and all questions were answered. Marland Kitchen. Health Maintenance issues including appropriate healthy diet, exercise, and smoking avoidance were discussed with patient. . See orders for this visit as documented in the electronic medical record. . Patient received an After Visit Summary.  Christina RimaErica Merlin Ege, DO [place], [facility] 12/17/2017  Future Appointments  Date Time Provider Department Center  06/15/2018  1:00 PM Christina Cowan, Christina Cooprider, DO [facility]

## 2017-12-12 ENCOUNTER — Ambulatory Visit: Payer: Self-pay | Admitting: Family Medicine

## 2017-12-12 ENCOUNTER — Encounter: Payer: Self-pay | Admitting: Family Medicine

## 2017-12-12 VITALS — BP 144/84 | HR 96 | Temp 97.8°F | Ht 64.0 in | Wt 179.0 lb

## 2017-12-12 DIAGNOSIS — F341 Dysthymic disorder: Secondary | ICD-10-CM

## 2017-12-12 DIAGNOSIS — R197 Diarrhea, unspecified: Secondary | ICD-10-CM

## 2017-12-12 DIAGNOSIS — E669 Obesity, unspecified: Secondary | ICD-10-CM

## 2017-12-12 DIAGNOSIS — E782 Mixed hyperlipidemia: Secondary | ICD-10-CM

## 2017-12-12 DIAGNOSIS — I10 Essential (primary) hypertension: Secondary | ICD-10-CM

## 2017-12-12 LAB — CBC WITH DIFFERENTIAL/PLATELET
Basophils Absolute: 0 10*3/uL (ref 0.0–0.1)
Basophils Relative: 0.3 % (ref 0.0–3.0)
Eosinophils Absolute: 0.1 10*3/uL (ref 0.0–0.7)
Eosinophils Relative: 1.7 % (ref 0.0–5.0)
HCT: 42.6 % (ref 36.0–46.0)
Hemoglobin: 14.8 g/dL (ref 12.0–15.0)
Lymphocytes Relative: 27.6 % (ref 12.0–46.0)
Lymphs Abs: 1.8 10*3/uL (ref 0.7–4.0)
MCHC: 34.8 g/dL (ref 30.0–36.0)
MCV: 88.7 fl (ref 78.0–100.0)
Monocytes Absolute: 0.6 10*3/uL (ref 0.1–1.0)
Monocytes Relative: 8.7 % (ref 3.0–12.0)
Neutro Abs: 4.1 10*3/uL (ref 1.4–7.7)
Neutrophils Relative %: 61.7 % (ref 43.0–77.0)
Platelets: 343 10*3/uL (ref 150.0–400.0)
RBC: 4.8 Mil/uL (ref 3.87–5.11)
RDW: 14.4 % (ref 11.5–15.5)
WBC: 6.6 10*3/uL (ref 4.0–10.5)

## 2017-12-12 LAB — LIPASE: Lipase: 14 U/L (ref 11.0–59.0)

## 2017-12-12 LAB — COMPREHENSIVE METABOLIC PANEL
ALT: 66 U/L — ABNORMAL HIGH (ref 0–35)
AST: 50 U/L — ABNORMAL HIGH (ref 0–37)
Albumin: 4.5 g/dL (ref 3.5–5.2)
Alkaline Phosphatase: 85 U/L (ref 39–117)
BUN: 13 mg/dL (ref 6–23)
CO2: 33 mEq/L — ABNORMAL HIGH (ref 19–32)
Calcium: 9.3 mg/dL (ref 8.4–10.5)
Chloride: 96 mEq/L (ref 96–112)
Creatinine, Ser: 0.99 mg/dL (ref 0.40–1.20)
GFR: 61.2 mL/min (ref >60.00)
Glucose, Bld: 106 mg/dL — ABNORMAL HIGH (ref 70–99)
Potassium: 2.9 mEq/L — ABNORMAL LOW (ref 3.5–5.1)
Sodium: 137 mEq/L (ref 135–145)
Total Bilirubin: 0.9 mg/dL (ref 0.2–1.2)
Total Protein: 7.6 g/dL (ref 6.0–8.3)

## 2017-12-12 LAB — LIPID PANEL
Cholesterol: 233 mg/dL — ABNORMAL HIGH (ref 0–200)
HDL: 36.9 mg/dL — ABNORMAL LOW (ref >39.00)
NonHDL: 196.42
Total CHOL/HDL Ratio: 6
Triglycerides: 275 mg/dL — ABNORMAL HIGH (ref 0.0–149.0)
VLDL: 55 mg/dL — ABNORMAL HIGH (ref 0.0–40.0)

## 2017-12-12 LAB — LDL CHOLESTEROL, DIRECT: Direct LDL: 158 mg/dL

## 2017-12-12 MED ORDER — ATORVASTATIN CALCIUM 20 MG PO TABS
20.0000 mg | ORAL_TABLET | Freq: Every day | ORAL | 1 refills | Status: DC
Start: 2017-12-12 — End: 2018-06-19

## 2017-12-12 MED ORDER — VENLAFAXINE HCL ER 75 MG PO CP24: ORAL_CAPSULE | ORAL | 3 refills | Status: DC

## 2017-12-12 MED ORDER — METRONIDAZOLE 500 MG PO TABS: 500.0000 mg | ORAL_TABLET | Freq: Three times a day (TID) | ORAL | 0 refills | Status: DC

## 2017-12-12 MED ORDER — LOSARTAN POTASSIUM-HCTZ 100-25 MG PO TABS: 1.0000 | ORAL_TABLET | Freq: Every day | ORAL | 3 refills | Status: DC

## 2017-12-17 ENCOUNTER — Encounter: Payer: Self-pay | Admitting: Family Medicine

## 2017-12-18 MED ORDER — POTASSIUM CHLORIDE CRYS ER 20 MEQ PO TBCR: 20.0000 meq | EXTENDED_RELEASE_TABLET | Freq: Two times a day (BID) | ORAL | 3 refills | Status: DC

## 2017-12-18 NOTE — Addendum Note (Signed)
Addended by: Helane RimaWALLACE, Quindell Shere R on: 12/18/2017 10:07 AM   Modules accepted: Orders

## 2017-12-22 ENCOUNTER — Other Ambulatory Visit: Payer: Self-pay | Admitting: Surgical

## 2017-12-22 ENCOUNTER — Telehealth: Payer: Self-pay

## 2017-12-22 DIAGNOSIS — E669 Obesity, unspecified: Secondary | ICD-10-CM

## 2017-12-22 NOTE — Telephone Encounter (Signed)
Pt coming for labs 12/15/17. Please place future orders. Thank you.

## 2017-12-22 NOTE — Telephone Encounter (Signed)
Lab order placed.

## 2017-12-25 ENCOUNTER — Other Ambulatory Visit: Payer: Self-pay

## 2017-12-25 ENCOUNTER — Other Ambulatory Visit (INDEPENDENT_AMBULATORY_CARE_PROVIDER_SITE_OTHER): Payer: Self-pay

## 2017-12-25 DIAGNOSIS — E669 Obesity, unspecified: Secondary | ICD-10-CM

## 2017-12-26 LAB — COMPREHENSIVE METABOLIC PANEL
ALT: 31 U/L (ref 0–35)
AST: 18 U/L (ref 0–37)
Albumin: 4.2 g/dL (ref 3.5–5.2)
Alkaline Phosphatase: 90 U/L (ref 39–117)
BUN: 10 mg/dL (ref 6–23)
CO2: 30 mEq/L (ref 19–32)
Calcium: 9.3 mg/dL (ref 8.4–10.5)
Chloride: 100 mEq/L (ref 96–112)
Creatinine, Ser: 1.12 mg/dL (ref 0.40–1.20)
GFR: 53.07 mL/min — ABNORMAL LOW (ref >60.00)
Glucose, Bld: 101 mg/dL — ABNORMAL HIGH (ref 70–99)
Potassium: 3.4 mEq/L — ABNORMAL LOW (ref 3.5–5.1)
Sodium: 140 mEq/L (ref 135–145)
Total Bilirubin: 0.4 mg/dL (ref 0.2–1.2)
Total Protein: 7.2 g/dL (ref 6.0–8.3)

## 2018-03-04 ENCOUNTER — Encounter: Payer: Self-pay | Admitting: Family Medicine

## 2018-03-16 ENCOUNTER — Telehealth: Payer: Self-pay

## 2018-03-16 ENCOUNTER — Other Ambulatory Visit: Payer: Self-pay

## 2018-03-16 DIAGNOSIS — E669 Obesity, unspecified: Secondary | ICD-10-CM

## 2018-03-16 NOTE — Telephone Encounter (Signed)
Order placed

## 2018-03-16 NOTE — Telephone Encounter (Signed)
Pt coming for labs 03/19/18. Please place future orders. Thank you.  

## 2018-03-19 ENCOUNTER — Other Ambulatory Visit (INDEPENDENT_AMBULATORY_CARE_PROVIDER_SITE_OTHER): Payer: Self-pay

## 2018-03-19 ENCOUNTER — Other Ambulatory Visit: Payer: Self-pay

## 2018-03-19 ENCOUNTER — Telehealth: Payer: Self-pay | Admitting: Family Medicine

## 2018-03-19 DIAGNOSIS — E669 Obesity, unspecified: Secondary | ICD-10-CM

## 2018-03-19 LAB — COMPREHENSIVE METABOLIC PANEL
ALT: 21 U/L (ref 0–35)
AST: 16 U/L (ref 0–37)
Albumin: 4.5 g/dL (ref 3.5–5.2)
Alkaline Phosphatase: 90 U/L (ref 39–117)
BUN: 13 mg/dL (ref 6–23)
CO2: 33 mEq/L — ABNORMAL HIGH (ref 19–32)
Calcium: 10 mg/dL (ref 8.4–10.5)
Chloride: 97 mEq/L (ref 96–112)
Creatinine, Ser: 1.21 mg/dL — ABNORMAL HIGH (ref 0.40–1.20)
GFR: 48.5 mL/min — ABNORMAL LOW (ref >60.00)
Glucose, Bld: 106 mg/dL — ABNORMAL HIGH (ref 70–99)
Potassium: 3.5 mEq/L (ref 3.5–5.1)
Sodium: 139 mEq/L (ref 135–145)
Total Bilirubin: 0.4 mg/dL (ref 0.2–1.2)
Total Protein: 7.5 g/dL (ref 6.0–8.3)

## 2018-03-19 MED ORDER — VENLAFAXINE HCL ER 75 MG PO CP24: ORAL_CAPSULE | ORAL | 0 refills | Status: DC

## 2018-03-19 NOTE — Telephone Encounter (Signed)
MEDICATION: venlafaxine er 75 MG capsule  PHARMACY: [facility] [id number]     IS THIS A 90 DAY SUPPLY :  no IS PATIENT OUT OF MEDICATION: no  IF NOT; HOW MUCH IS LEFT: 6 capsules/ 2 days   LAST APPOINTMENT DATE: @7 /19/2019  NEXT APPOINTMENT DATE:@10 /18/2019  OTHER COMMENTS: Patient states she is leaving town on Saturday and thus needs a refill before then   **Let patient know to contact pharmacy at the end of the day to make sure medication is ready. **  ** Please notify patient to allow 48-72 hours to process**  **Encourage patient to contact the pharmacy for refills or they can [facility] through [facility]**

## 2018-03-19 NOTE — Telephone Encounter (Signed)
Okay 

## 2018-03-19 NOTE — Telephone Encounter (Signed)
Refill sent into office.

## 2018-03-19 NOTE — Telephone Encounter (Signed)
Ok to refill 

## 2018-03-25 ENCOUNTER — Other Ambulatory Visit: Payer: Self-pay | Admitting: Family Medicine

## 2018-03-25 DIAGNOSIS — E669 Obesity, unspecified: Secondary | ICD-10-CM

## 2018-04-15 ENCOUNTER — Other Ambulatory Visit: Payer: Self-pay | Admitting: Family Medicine

## 2018-04-15 DIAGNOSIS — I10 Essential (primary) hypertension: Secondary | ICD-10-CM

## 2018-05-24 ENCOUNTER — Other Ambulatory Visit: Payer: Self-pay | Admitting: Family Medicine

## 2018-05-24 DIAGNOSIS — I10 Essential (primary) hypertension: Secondary | ICD-10-CM

## 2018-05-31 ENCOUNTER — Other Ambulatory Visit: Payer: Self-pay | Admitting: Family Medicine

## 2018-05-31 DIAGNOSIS — E669 Obesity, unspecified: Secondary | ICD-10-CM

## 2018-06-01 NOTE — Telephone Encounter (Signed)
Please advise on refill.   LOV 12/12/17  Next OV 06/15/18

## 2018-06-14 NOTE — Progress Notes (Deleted)
Christina Cowan is a 57 y.o. female is here for follow up.  History of Present Illness:   {CMA SCRIBE ATTESTATION}  HPI:   Health Maintenance Due  Topic Date Due  . Hepatitis C Screening  [birth year]  . MAMMOGRAM  10/19/2009  . COLON CANCER SCREENING ANNUAL FOBT  10/19/2009  . PAP SMEAR  07/14/2015  . INFLUENZA VACCINE  03/29/2018   Depression screen PHQ 2/9 12/02/2015  Decreased Interest 0  Down, Depressed, Hopeless 0  PHQ - 2 Score 0   PMHx, SurgHx, SocialHx, FamHx, Medications, and Allergies were reviewed in the Visit Navigator and updated as appropriate.   Patient Active Problem List   Diagnosis Date Noted  . Essential hypertension, benign 06/17/2014  . Pure hypercholesterolemia 06/17/2014  . Shingles rash 03/18/2014  . ADD (attention deficit disorder) 03/18/2014  . GERD 12/16/2009  . Vitamin D deficiency 12/24/2008  . Dysthymic disorder 07/24/2007  . ADD 07/24/2007   Social History   Tobacco Use  . Smoking status: Current Every Day Smoker    Packs/day: 0.40    Years: 16.00    Pack years: 6.40    Types: Cigarettes  . Smokeless tobacco: Never Used  Substance Use Topics  . Alcohol use: Yes    Alcohol/week: 4.0 standard drinks    Types: 4 Glasses of wine per week    Comment: occ  . Drug use: Yes    Types: Hydrocodone   Current Medications and Allergies:   Current Outpatient Medications:  .  atorvastatin (LIPITOR) 20 MG tablet, Take 1 tablet (20 mg total) by mouth daily., Disp: 90 tablet, Rfl: 1 .  losartan-hydrochlorothiazide (HYZAAR) 100-25 MG tablet, TAKE 1 TABLET BY MOUTH DAILY, Disp: 30 tablet, Rfl: 3 .  metroNIDAZOLE (FLAGYL) 500 MG tablet, Take 1 tablet (500 mg total) by mouth 3 (three) times daily., Disp: 30 tablet, Rfl: 0 .  potassium chloride SA (K-DUR,KLOR-CON) 20 MEQ tablet, Take 1 tablet (20 mEq total) by mouth 2 (two) times daily., Disp: 60 tablet, Rfl: 3 .  venlafaxine XR (EFFEXOR-XR) 75 MG 24 hr capsule, TAKE 3 CAPSULES(225 MG) BY MOUTH DAILY WITH  BREAKFAST, Disp: 90 capsule, Rfl: 0 .  venlafaxine XR (EFFEXOR-XR) 75 MG 24 hr capsule, TAKE 3 CAPSULES(225 MG) BY MOUTH DAILY WITH BREAKFAST, Disp: 90 capsule, Rfl: 0 .  venlafaxine XR (EFFEXOR-XR) 75 MG 24 hr capsule, TAKE 3 CAPSULES(225 MG) BY MOUTH DAILY WITH BREAKFAST, Disp: 90 capsule, Rfl: 0  No Known Allergies Review of Systems   Pertinent items are noted in the HPI. Otherwise, ROS is negative.  Vitals:  There were no vitals filed for this visit.   There is no height or weight on file to calculate BMI.  Physical Exam:   Physical Exam  Results for orders placed or performed in visit on 03/19/18  Comprehensive metabolic panel  Result Value Ref Range   Sodium 139 135 - 145 mEq/L   Potassium 3.5 3.5 - 5.1 mEq/L   Chloride 97 96 - 112 mEq/L   CO2 33 (H) 19 - 32 mEq/L   Glucose, Bld 106 (H) 70 - 99 mg/dL   BUN 13 6 - 23 mg/dL   Creatinine, Ser 1.61 (H) 0.40 - 1.20 mg/dL   Total Bilirubin 0.4 0.2 - 1.2 mg/dL   Alkaline Phosphatase 90 39 - 117 U/L   AST 16 0 - 37 U/L   ALT 21 0 - 35 U/L   Total Protein 7.5 6.0 - 8.3 g/dL   Albumin 4.5 3.5 -  5.2 g/dL   Calcium 56.2 8.4 - 13.0 mg/dL   GFR 86.57 (L) >84.69 mL/min    Assessment and Plan:   There are no diagnoses linked to this encounter.  . Reviewed expectations re: course of current medical issues. . Discussed self-management of symptoms. . Outlined signs and symptoms indicating need for more acute intervention. . Patient verbalized understanding and all questions were answered. Marland Kitchen Health Maintenance issues including appropriate healthy diet, exercise, and smoking avoidance were discussed with patient. . See orders for this visit as documented in the electronic medical record. . Patient received an After Visit Summary.  *** CMA served as Neurosurgeon during this visit. History, Physical, and Plan performed by medical provider. The above documentation has been reviewed and is accurate and complete. Helane Rima, D.O.  Helane Rima, DO [place], [facility] 06/14/2018

## 2018-06-15 ENCOUNTER — Ambulatory Visit: Payer: Self-pay | Admitting: Family Medicine

## 2018-06-15 DIAGNOSIS — Z0289 Encounter for other administrative examinations: Secondary | ICD-10-CM

## 2018-06-19 ENCOUNTER — Encounter: Payer: Self-pay | Admitting: Family Medicine

## 2018-06-19 ENCOUNTER — Ambulatory Visit: Payer: Self-pay | Admitting: Family Medicine

## 2018-06-19 VITALS — BP 138/86 | HR 85 | Temp 98.1°F | Ht 64.0 in | Wt 173.0 lb

## 2018-06-19 DIAGNOSIS — I10 Essential (primary) hypertension: Secondary | ICD-10-CM

## 2018-06-19 DIAGNOSIS — E7800 Pure hypercholesterolemia, unspecified: Secondary | ICD-10-CM

## 2018-06-19 DIAGNOSIS — E782 Mixed hyperlipidemia: Secondary | ICD-10-CM

## 2018-06-19 DIAGNOSIS — Z23 Encounter for immunization: Secondary | ICD-10-CM

## 2018-06-19 DIAGNOSIS — E669 Obesity, unspecified: Secondary | ICD-10-CM

## 2018-06-19 LAB — BASIC METABOLIC PANEL
BUN: 14 mg/dL (ref 6–23)
CO2: 31 mEq/L (ref 19–32)
Calcium: 9.5 mg/dL (ref 8.4–10.5)
Chloride: 98 mEq/L (ref 96–112)
Creatinine, Ser: 1.08 mg/dL (ref 0.40–1.20)
GFR: 55.25 mL/min — ABNORMAL LOW (ref >60.00)
Glucose, Bld: 92 mg/dL (ref 70–99)
Potassium: 3.3 mEq/L — ABNORMAL LOW (ref 3.5–5.1)
Sodium: 136 mEq/L (ref 135–145)

## 2018-06-19 MED ORDER — ATORVASTATIN CALCIUM 20 MG PO TABS: 20.0000 mg | ORAL_TABLET | Freq: Every day | ORAL | 3 refills | Status: DC

## 2018-06-19 MED ORDER — LOSARTAN POTASSIUM-HCTZ 100-25 MG PO TABS: 1.0000 | ORAL_TABLET | Freq: Every day | ORAL | 3 refills | Status: DC

## 2018-06-19 MED ORDER — VENLAFAXINE HCL ER 75 MG PO CP24: ORAL_CAPSULE | ORAL | 3 refills | Status: DC

## 2018-06-19 NOTE — Progress Notes (Signed)
Jerris Keltz is a 57 y.o. female is here for follow up.  History of Present Illness:   HPI:   Health Maintenance Due  Topic Date Due  . Hepatitis C Screening  1960/09/02  . MAMMOGRAM  10/19/2009  . COLON CANCER SCREENING ANNUAL FOBT  10/19/2009  . PAP SMEAR  07/14/2015   Depression screen [facility] 2/9 06/19/2018 12/02/2015  Decreased Interest 0 0  Down, Depressed, Hopeless 0 0  PHQ - 2 Score 0 0  Altered sleeping 1 -  Tired, decreased energy 1 -  Change in appetite 0 -  Feeling bad or failure about yourself  0 -  Trouble concentrating 0 -  Moving slowly or fidgety/restless 0 -  Suicidal thoughts 0 -  PHQ-9 Score 2 -  Difficult doing work/chores Somewhat difficult -   PMHx, SurgHx, SocialHx, FamHx, Medications, and Allergies were reviewed in the Visit Navigator and updated as appropriate.   Patient Active Problem List   Diagnosis Date Noted  . Essential hypertension, benign 06/17/2014  . Pure hypercholesterolemia 06/17/2014  . Shingles rash 03/18/2014  . ADD (attention deficit disorder) 03/18/2014  . GERD 12/16/2009  . Vitamin D deficiency 12/24/2008  . Dysthymic disorder 07/24/2007  . ADD 07/24/2007   Social History   Tobacco Use  . Smoking status: Current Every Day Smoker    Packs/day: 0.40    Years: 16.00    Pack years: 6.40    Types: Cigarettes  . Smokeless tobacco: Never Used  Substance Use Topics  . Alcohol use: Yes    Alcohol/week: 4.0 standard drinks    Types: 4 Glasses of wine per week    Comment: occ  . Drug use: Yes    Types: Hydrocodone   Current Medications and Allergies:   .  atorvastatin (LIPITOR) 20 MG tablet, Take 1 tablet (20 mg total) by mouth daily., Disp: 90 tablet, Rfl: 3 .  losartan-hydrochlorothiazide (HYZAAR) 100-25 MG tablet, Take 1 tablet by mouth daily., Disp: 90 tablet, Rfl: 3 .  venlafaxine XR (EFFEXOR-XR) 75 MG 24 hr capsule, TAKE 3 CAPSULES(225 MG) BY MOUTH DAILY WITH BREAKFAST, Disp: 90 capsule, Rfl: 3  No Known Allergies    Review of Systems   Pertinent items are noted in the HPI. Otherwise, ROS is negative.  Vitals:   Vitals:   06/19/18 1306  BP: 138/86  Pulse: 85  Temp: 98.1 F (36.7 C)  TempSrc: Oral  SpO2: 97%  Weight: 173 lb (78.5 kg)  Height: 5\' 4"  (1.626 m)     Body mass index is 29.7 kg/m.  Physical Exam:   Physical Exam  Constitutional: She appears well-nourished.  HENT:  Head: Normocephalic and atraumatic.  Eyes: Pupils are equal, round, and reactive to light. EOM are normal.  Neck: Normal range of motion. Neck supple.  Cardiovascular: Normal rate, regular rhythm, normal heart sounds and intact distal pulses.  Pulmonary/Chest: Effort normal.  Abdominal: Soft.  Skin: Skin is warm.  Psychiatric: She has a normal mood and affect. Her behavior is normal.  Nursing note and vitals reviewed.  Assessment and Plan:   Dariann was seen today for follow-up.  Diagnoses and all orders for this visit:  Essential hypertension, benign -     Basic metabolic panel -     losartan-hydrochlorothiazide (HYZAAR) 100-25 MG tablet; Take 1 tablet by mouth daily.  Need for immunization against influenza -     Flu Vaccine QUAD 36+ mos IM  Mixed hyperlipidemia Comments: Refill medications today.  Orders: -  atorvastatin (LIPITOR) 20 MG tablet; Take 1 tablet (20 mg total) by mouth daily.  Obesity (BMI 30-39.9) Comments: Reviewed healthy food choices and exercise. Orders: -     venlafaxine XR (EFFEXOR-XR) 75 MG 24 hr capsule; TAKE 3 CAPSULES(225 MG) BY MOUTH DAILY WITH BREAKFAST   . Reviewed expectations re: course of current medical issues. . Discussed self-management of symptoms. . Outlined signs and symptoms indicating need for more acute intervention. . Patient verbalized understanding and all questions were answered. Marland Kitchen Health Maintenance issues including appropriate healthy diet, exercise, and smoking avoidance were discussed with patient. . See orders for this visit as documented in  the electronic medical record. . Patient received an After Visit Summary.  Helane Rima, DO [employer], [facility] 06/21/2018

## 2018-06-21 ENCOUNTER — Encounter: Payer: Self-pay | Admitting: Family Medicine

## 2018-07-05 ENCOUNTER — Telehealth: Payer: Self-pay

## 2018-07-05 NOTE — Telephone Encounter (Signed)
Pharmacy called wanted ok to separate the losartan HCTZ

## 2018-07-05 NOTE — Telephone Encounter (Signed)
Okay 

## 2018-07-06 ENCOUNTER — Other Ambulatory Visit: Payer: Self-pay

## 2018-07-06 MED ORDER — LOSARTAN POTASSIUM 100 MG PO TABS: 100.0000 mg | ORAL_TABLET | Freq: Every day | ORAL | 3 refills | Status: DC

## 2018-07-06 MED ORDER — HYDROCHLOROTHIAZIDE 25 MG PO TABS: 25.0000 mg | ORAL_TABLET | Freq: Every day | ORAL | 3 refills | Status: DC

## 2018-07-06 NOTE — Telephone Encounter (Signed)
Called patient per DPR ok to leave message. Let patient know on v/m that meds have been called in.

## 2018-09-08 ENCOUNTER — Other Ambulatory Visit: Payer: Self-pay | Admitting: Family Medicine

## 2018-09-08 DIAGNOSIS — I10 Essential (primary) hypertension: Secondary | ICD-10-CM

## 2018-09-13 ENCOUNTER — Other Ambulatory Visit: Payer: Self-pay

## 2018-09-13 DIAGNOSIS — E669 Obesity, unspecified: Secondary | ICD-10-CM

## 2018-09-13 MED ORDER — LOSARTAN POTASSIUM 100 MG PO TABS: 100.0000 mg | ORAL_TABLET | Freq: Every day | ORAL | 3 refills | Status: DC

## 2018-09-13 MED ORDER — VENLAFAXINE HCL ER 75 MG PO CP24: ORAL_CAPSULE | ORAL | 3 refills | Status: DC

## 2019-01-07 ENCOUNTER — Other Ambulatory Visit: Payer: Self-pay

## 2019-01-07 ENCOUNTER — Telehealth: Payer: Self-pay | Admitting: Family Medicine

## 2019-01-07 ENCOUNTER — Telehealth (INDEPENDENT_AMBULATORY_CARE_PROVIDER_SITE_OTHER): Payer: Self-pay | Admitting: Family Medicine

## 2019-01-07 ENCOUNTER — Encounter: Payer: Self-pay | Admitting: Family Medicine

## 2019-01-07 VITALS — Ht 64.0 in | Wt 173.0 lb

## 2019-01-07 DIAGNOSIS — F341 Dysthymic disorder: Secondary | ICD-10-CM

## 2019-01-07 DIAGNOSIS — E669 Obesity, unspecified: Secondary | ICD-10-CM

## 2019-01-07 MED ORDER — VENLAFAXINE HCL ER 150 MG PO CP24: 300.0000 mg | ORAL_CAPSULE | Freq: Every day | ORAL | 2 refills | Status: DC

## 2019-01-07 NOTE — Progress Notes (Signed)
Virtual Visit via Video   Due to the COVID-19 pandemic, this visit was completed with telemedicine (audio/video) technology to reduce patient and provider exposure as well as to preserve personal protective equipment.   I connected with Christina Cowan by a video enabled telemedicine application and verified that I am speaking with the correct person using two identifiers. Location patient: Home Location provider: [facility], Office Persons participating in the virtual visit: Kelis Middaugh, Helane Rima, DO [name], CMA acting as scribe for Dr. Helane Rima.   I discussed the limitations of evaluation and management by telemedicine and the availability of in person appointments. The patient expressed understanding and agreed to proceed.  Care Team   Patient Care Team: Helane Rima, DO as PCP - General (Family Medicine)  Subjective:   HPI: Patient in for refill on Effexor. She feels like she may have some improvement. She has been on 300 mg in the past and feels like she had better improvement at that dose. She would like to go back to that.   Review of Systems  Constitutional: Negative for chills and fever.  HENT: Negative for hearing loss and tinnitus.   Eyes: Negative for blurred vision.  Respiratory: Negative for cough and hemoptysis.   Cardiovascular: Negative for chest pain and palpitations.  Gastrointestinal: Negative for heartburn, nausea and vomiting.  Genitourinary: Negative for dysuria.  Musculoskeletal: Negative for myalgias.  Skin: Negative for rash.  Neurological: Negative for dizziness and headaches.  Endo/Heme/Allergies: Does not bruise/bleed easily.  Psychiatric/Behavioral: Negative for depression and suicidal ideas.    Patient Active Problem List   Diagnosis Date Noted  . Essential hypertension, benign 06/17/2014  . Pure hypercholesterolemia 06/17/2014  . Shingles rash 03/18/2014  . ADD (attention deficit disorder) 03/18/2014  . GERD 12/16/2009  .  Vitamin D deficiency 12/24/2008  . Dysthymic disorder 07/24/2007  . ADD 07/24/2007    Social History   Tobacco Use  . Smoking status: Current Every Day Smoker    Packs/day: 0.40    Years: 16.00    Pack years: 6.40    Types: Cigarettes  . Smokeless tobacco: Never Used  Substance Use Topics  . Alcohol use: Yes    Alcohol/week: 4.0 standard drinks    Types: 4 Glasses of wine per week    Comment: occ    Current Outpatient Medications:  .  atorvastatin (LIPITOR) 20 MG tablet, Take 1 tablet (20 mg total) by mouth daily., Disp: 90 tablet, Rfl: 3 .  hydrochlorothiazide (HYDRODIURIL) 25 MG tablet, Take 1 tablet (25 mg total) by mouth daily., Disp: 90 tablet, Rfl: 3 .  venlafaxine XR (EFFEXOR XR) 150 MG 24 hr capsule, Take 2 capsules (300 mg total) by mouth daily with breakfast., Disp: 60 capsule, Rfl: 2  Allergies  Allergen Reactions  . Prednisone     Objective:   VITALS: Per patient if applicable, see vitals. GENERAL: Alert, appears well and in no acute distress. HEENT: Atraumatic, conjunctiva clear, no obvious abnormalities on inspection of external nose and ears. NECK: Normal movements of the head and neck. CARDIOPULMONARY: No increased WOB. Speaking in clear sentences. I:E ratio WNL.  MS: Moves all visible extremities without noticeable abnormality. PSYCH: Pleasant and cooperative, well-groomed. Speech normal rate and rhythm. Affect is appropriate. Insight and judgement are appropriate. Attention is focused, linear, and appropriate.  NEURO: CN grossly intact. Oriented as arrived to appointment on time with no prompting. Moves both UE equally.  SKIN: No obvious lesions, wounds, erythema, or cyanosis noted on face  or hands.  [facility] 06/19/2018 12/02/2015  Decreased Interest 0 0  Down, Depressed, Hopeless 0 0  PHQ - 2 Score 0 0  Altered sleeping 1 -  Tired, decreased energy 1 -  Change in appetite 0 -  Feeling bad or failure about yourself  0 -  Trouble  concentrating 0 -  Moving slowly or fidgety/restless 0 -  Suicidal thoughts 0 -  PHQ-9 Score 2 -  Difficult doing work/chores Somewhat difficult -    Assessment and Plan:   Christina Cowan was seen today for follow-up.  Diagnoses and all orders for this visit:  Dysthymic disorder -     venlafaxine XR (EFFEXOR XR) 150 MG 24 hr capsule; Take 2 capsules (300 mg total) by mouth daily with breakfast.  Obesity (BMI 30-39.9) Comments: Reviewed healthy food choices and exercise.   Marland Kitchen. COVID-19 Education: The signs and symptoms of COVID-19 were discussed with the patient and how to seek care for testing if needed. The importance of social distancing was discussed today. . Reviewed expectations re: course of current medical issues. . Discussed self-management of symptoms. . Outlined signs and symptoms indicating need for more acute intervention. . Patient verbalized understanding and all questions were answered. Marland Kitchen. Health Maintenance issues including appropriate healthy diet, exercise, and smoking avoidance were discussed with patient. . See orders for this visit as documented in the electronic medical record.  Helane RimaErica Rashidah [place], DO  Records requested if needed. Time spent: 25 minutes, of which >50% was spent in obtaining information about her symptoms, reviewing her previous labs, evaluations, and treatments, counseling her about her condition (please see the discussed topics above), and developing a plan to further investigate it; she had a number of questions which I addressed.

## 2019-01-07 NOTE — Telephone Encounter (Signed)
See note

## 2019-01-07 NOTE — Telephone Encounter (Signed)
Last OV 06/19/2018 lst refill 09/13/18 #90/3 Next OV not scheduled

## 2019-01-07 NOTE — Telephone Encounter (Signed)
Pt has been scheduled for virtual visit with Dr. Earlene Plater today at 4:20.

## 2019-01-07 NOTE — Telephone Encounter (Signed)
Copied from CRM [id number]. Topic: Quick Communication - Rx Refill/Question >> Jan 07, 2019 12:33 PM Mila Palmer, [place] B, NT wrote: Medication: venlafaxine XR (EFFEXOR-XR) 75 MG 24 hr capsule  Has the patient contacted their pharmacy? yes (Agent: If no, request that the patient contact the pharmacy for the refill.) (Agent: If yes, when and what did the pharmacy advise?)  Preferred Pharmacy (with phone number or street name): [facility] - [place], [place] - [address]  Agent: Please be advised that RX refills may take up to 3 business days. We ask that you follow-up with your pharmacy.

## 2019-01-07 NOTE — Telephone Encounter (Signed)
Called pt and left VM to call the office. Needs to schedule virtual med f/u visit.

## 2019-01-14 ENCOUNTER — Encounter: Payer: Self-pay | Admitting: Family Medicine

## 2019-04-08 ENCOUNTER — Other Ambulatory Visit: Payer: Self-pay | Admitting: Family Medicine

## 2019-04-08 DIAGNOSIS — F341 Dysthymic disorder: Secondary | ICD-10-CM

## 2019-04-08 NOTE — Telephone Encounter (Signed)
Last Fill 01/07/19  #60/2 Last OV 01/07/19

## 2019-07-15 ENCOUNTER — Other Ambulatory Visit: Payer: Self-pay | Admitting: Family Medicine

## 2019-07-15 DIAGNOSIS — F341 Dysthymic disorder: Secondary | ICD-10-CM

## 2019-07-16 NOTE — Telephone Encounter (Signed)
Pt wanted to make sure her request for a refill was received for this medication/ please advise / Pt has 1 pill left

## 2019-07-16 NOTE — Telephone Encounter (Signed)
FYI - Pt scheduled 1st available TOC with Inda Coke

## 2019-07-16 NOTE — Telephone Encounter (Signed)
See note

## 2019-07-18 ENCOUNTER — Ambulatory Visit: Payer: Self-pay

## 2019-08-05 ENCOUNTER — Other Ambulatory Visit: Payer: Self-pay

## 2019-08-05 ENCOUNTER — Other Ambulatory Visit: Payer: Self-pay | Admitting: Family Medicine

## 2019-08-06 ENCOUNTER — Encounter: Payer: Self-pay | Admitting: Physician Assistant

## 2019-08-06 ENCOUNTER — Ambulatory Visit (INDEPENDENT_AMBULATORY_CARE_PROVIDER_SITE_OTHER): Payer: Self-pay | Admitting: Physician Assistant

## 2019-08-06 VITALS — BP 130/80 | HR 79 | Temp 97.6°F | Ht 64.0 in | Wt 172.0 lb

## 2019-08-06 DIAGNOSIS — F341 Dysthymic disorder: Secondary | ICD-10-CM

## 2019-08-06 DIAGNOSIS — I10 Essential (primary) hypertension: Secondary | ICD-10-CM

## 2019-08-06 DIAGNOSIS — Z23 Encounter for immunization: Secondary | ICD-10-CM

## 2019-08-06 DIAGNOSIS — F909 Attention-deficit hyperactivity disorder, unspecified type: Secondary | ICD-10-CM

## 2019-08-06 DIAGNOSIS — E782 Mixed hyperlipidemia: Secondary | ICD-10-CM

## 2019-08-06 MED ORDER — AMPHETAMINE-DEXTROAMPHETAMINE 10 MG PO TABS: 10.0000 mg | ORAL_TABLET | Freq: Every day | ORAL | 0 refills | Status: DC

## 2019-08-06 MED ORDER — LOSARTAN POTASSIUM-HCTZ 100-25 MG PO TABS: 1.0000 | ORAL_TABLET | Freq: Every day | ORAL | 3 refills | Status: DC

## 2019-08-06 MED ORDER — VENLAFAXINE HCL ER 150 MG PO CP24: 300.0000 mg | ORAL_CAPSULE | Freq: Every day | ORAL | 2 refills | Status: DC

## 2019-08-06 MED ORDER — ATORVASTATIN CALCIUM 20 MG PO TABS: 20.0000 mg | ORAL_TABLET | Freq: Every day | ORAL | 3 refills | Status: DC

## 2019-08-06 NOTE — Patient Instructions (Signed)
It was great to see you!  Refills sent.  May trial Adderall 10 mg daily as needed.  Please send me your blood pressure average over the next 2-4 weeks.  If you develop chest pain, shortness of breath, or palpitations with the adderrall, please discontinue.  Please continue to work on smoking cessation.  Let's follow-up in 6 months, sooner if you have concerns.  Take care,  Inda Coke PA-C

## 2019-08-06 NOTE — Progress Notes (Signed)
Christina Cowan is a 58 y.o. female is here for Transfer of care.  I acted as a Education administrator for [employer], PA-C Anselmo Pickler, LPN  History of Present Illness:   Chief Complaint  Patient presents with  . Transfer of care    from Dr. Juleen China  . Anxiety  . Depression  . Hypertension    HPI   Pt here for transfer of care today from Dr. Juleen China.  Hypertension Pt following up today. Pt is checking blood pressure daily, averaging 143/80's She is currently taking Losartan 100 mg and HCTZ 25 mg daily. Pt denies headaches, dizziness, blurred vision, chest pain, SOB or lower leg edema. Denies excessive caffeine intake, stimulant usage, excessive alcohol intake or increase in salt consumption.  BP Readings from Last 3 Encounters:  08/06/19 130/80  06/19/18 138/86  12/12/17 (!) 144/84   Anxiety & Depression Pt following up, currently taking Venlafaxine XR 150 mg 2 capsules daily. She denies SI/HI. Reports that this medication works well for her, has been on for several years.  ADD Uncontrolled. Was on Adderrall 20 mg TID several years ago but was stopped due to blood pressure. She currently denies: chest pain, SOB, palpitations.   Hyperlipidemia Needs refills on her cholesterol medication. Takes lipitor 20 mg daily and takes a prescribed, denies myalgias. She is a smoker.  She currently does not have any health insurance and is planning to have some in Feb 2021.   Health Maintenance Due  Topic Date Due  . INFLUENZA VACCINE  03/30/2019    Past Medical History:  Diagnosis Date  . Anxiety   . Arthritis   . Asthma   . Essential hypertension, benign 06/17/2014  . GERD (gastroesophageal reflux disease)   . Pure hypercholesterolemia 06/17/2014     Social History   Socioeconomic History  . Marital status: Widowed    Spouse name: Not on file  . Number of children: Not on file  . Years of education: Not on file  . Highest education level: Not on file  Occupational History   . Not on file  Social Needs  . Financial resource strain: Not on file  . Food insecurity    Worry: Not on file    Inability: Not on file  . Transportation needs    Medical: Not on file    Non-medical: Not on file  Tobacco Use  . Smoking status: Current Every Day Smoker    Packs/day: 0.40    Years: 16.00    Pack years: 6.40    Types: Cigarettes  . Smokeless tobacco: Never Used  Substance and Sexual Activity  . Alcohol use: Yes    Alcohol/week: 4.0 standard drinks    Types: 4 Glasses of wine per week    Comment: occ  . Drug use: Yes    Types: Hydrocodone  . Sexual activity: Yes    Birth control/protection: I.U.D.  Lifestyle  . Physical activity    Days per week: Not on file    Minutes per session: Not on file  . Stress: Not on file  Relationships  . Social Herbalist on phone: Not on file    Gets together: Not on file    Attends religious service: Not on file    Active member of club or organization: Not on file    Attends meetings of clubs or organizations: Not on file    Relationship status: Not on file  . Intimate partner violence    Fear of  current or ex partner: Not on file    Emotionally abused: Not on file    Physically abused: Not on file    Forced sexual activity: Not on file  Other Topics Concern  . Not on file  Social History Narrative  . Not on file    Past Surgical History:  Procedure Laterality Date  . CESAREAN SECTION      Family History  Problem Relation Age of Onset  . Arthritis Mother   . Hypertension Mother   . Arthritis Father   . Colon cancer Paternal Uncle     PMHx, SurgHx, SocialHx, FamHx, Medications, and Allergies were reviewed in the Visit Navigator and updated as appropriate.   Patient Active Problem List   Diagnosis Date Noted  . Mixed hyperlipidemia 08/06/2019  . Essential hypertension, benign 06/17/2014  . Shingles rash 03/18/2014  . GERD 12/16/2009  . Vitamin D deficiency 12/24/2008  . Dysthymic disorder  07/24/2007  . Attention deficit disorder 07/24/2007    Social History   Tobacco Use  . Smoking status: Current Every Day Smoker    Packs/day: 0.40    Years: 16.00    Pack years: 6.40    Types: Cigarettes  . Smokeless tobacco: Never Used  Substance Use Topics  . Alcohol use: Yes    Alcohol/week: 4.0 standard drinks    Types: 4 Glasses of wine per week    Comment: occ  . Drug use: Yes    Types: Hydrocodone    Current Medications and Allergies:    Current Outpatient Medications:  .  atorvastatin (LIPITOR) 20 MG tablet, Take 1 tablet (20 mg total) by mouth daily., Disp: 90 tablet, Rfl: 3 .  hydrochlorothiazide (HYDRODIURIL) 25 MG tablet, Take 1 tablet (25 mg total) by mouth daily., Disp: 90 tablet, Rfl: 3 .  losartan (COZAAR) 100 MG tablet, Take 100 mg by mouth daily., Disp: , Rfl:  .  venlafaxine XR (EFFEXOR-XR) 150 MG 24 hr capsule, Take 2 capsules (300 mg total) by mouth daily with breakfast., Disp: 180 capsule, Rfl: 2 .  amphetamine-dextroamphetamine (ADDERALL) 10 MG tablet, Take 1 tablet (10 mg total) by mouth daily with breakfast., Disp: 30 tablet, Rfl: 0 .  losartan-hydrochlorothiazide (HYZAAR) 100-25 MG tablet, Take 1 tablet by mouth daily., Disp: 90 tablet, Rfl: 3   Allergies  Allergen Reactions  . Prednisone     Review of Systems   ROS  Negative unless otherwise specified per HPI.   Vitals:   Vitals:   08/06/19 1002  BP: 130/80  Pulse: 79  Temp: 97.6 F (36.4 C)  TempSrc: Temporal  SpO2: 98%  Weight: 172 lb (78 kg)  Height: 5\' 4"  (1.626 m)     Body mass index is 29.52 kg/m.   Physical Exam:    Physical Exam Vitals signs and nursing note reviewed.  Constitutional:      General: She is not in acute distress.    Appearance: She is well-developed. She is not ill-appearing or toxic-appearing.  Cardiovascular:     Rate and Rhythm: Normal rate and regular rhythm.     Pulses: Normal pulses.     Heart sounds: Normal heart sounds, S1 normal and S2  normal.     Comments: No LE edema Pulmonary:     Effort: Pulmonary effort is normal.     Breath sounds: Normal breath sounds.  Skin:    General: Skin is warm and dry.  Neurological:     Mental Status: She is alert.  GCS: GCS eye subscore is 4. GCS verbal subscore is 5. GCS motor subscore is 6.  Psychiatric:        Speech: Speech normal.        Behavior: Behavior normal. Behavior is cooperative.        Assessment and Plan:    Inessa was seen today for transfer of care, anxiety, depression and hypertension.  Diagnoses and all orders for this visit:  Essential hypertension, benign Well controlled on Losartan 100 mg and HCTZ 25 mg daily. She would like combo med sent in if possible, so I have sent in Hyzaar. Follow-up in 6 months, sooner if concerns.  Mixed hyperlipidemia Refill. Will obtain lipid panel once patient has health insurance. Orders: -     atorvastatin (LIPITOR) 20 MG tablet; Take 1 tablet (20 mg total) by mouth daily.  Dysthymic disorder Well controlled on medication below. Continue medication. I discussed with patient that if they develop any SI, to tell someone immediately and seek medical attention. -     venlafaxine XR (EFFEXOR-XR) 150 MG 24 hr capsule; Take 2 capsules (300 mg total) by mouth daily with breakfast.  Need for immunization against influenza -     Flu Vaccine QUAD 36+ mos IM  Attention deficit hyperactivity disorder (ADHD), unspecified ADHD type Uncontrolled. Will trial Adderall 10 mg daily prn. Recommended that she check her blood pressure regularly while on this medication and send me weekly averages in 2-4 weeks. I also advised her to immediately stop medication if she develops any cardiac sx including chest pain, SOB, palpitations, etc.  Other orders -     losartan-hydrochlorothiazide (HYZAAR) 100-25 MG tablet; Take 1 tablet by mouth daily. -     amphetamine-dextroamphetamine (ADDERALL) 10 MG tablet; Take 1 tablet (10 mg total) by mouth daily  with breakfast.    . Reviewed expectations re: course of current medical issues. . Discussed self-management of symptoms. . Outlined signs and symptoms indicating need for more acute intervention. . Patient verbalized understanding and all questions were answered. . See orders for this visit as documented in the electronic medical record. . Patient received an After Visit Summary.  CMA or LPN served as scribe during this visit. History, Physical, and Plan performed by medical provider. The above documentation has been reviewed and is accurate and complete.   [name], PA-C [place], [facility] [place] 08/06/2019  Follow-up: No follow-ups on file.

## 2019-09-05 ENCOUNTER — Telehealth: Payer: Self-pay | Admitting: Physician Assistant

## 2019-09-05 ENCOUNTER — Encounter: Payer: Self-pay | Admitting: *Deleted

## 2019-09-05 NOTE — Telephone Encounter (Signed)
Left message on voicemail to call office. Also My Chart message sent to pt.

## 2019-09-05 NOTE — Telephone Encounter (Signed)
Please see message, Last OV 08/06/2019.

## 2019-09-05 NOTE — Telephone Encounter (Signed)
Patient was instructed to send [place] BP measurements so we could determine if her blood pressure was well managed while on this medication. I have not seen any of these and am not comfortable refilling medication until I see her blood pressures.

## 2019-09-05 NOTE — Telephone Encounter (Signed)
Pt called back told her [name] is needing your blood pressure readings before she will refill medication. Asked pt if she has been taking blood pressure? Pt said yes she has been keeping track. Told her okay I sent you a My Chart message just attached blood pressure readings to that and then [name] can review them. Pt verbalized understanding.

## 2019-09-05 NOTE — Telephone Encounter (Signed)
Christina Cowan wanted to know if she could get a refill on her Adderall 10 MG, she said it has been helping her and that she is about out.

## 2019-09-19 ENCOUNTER — Encounter: Payer: Self-pay | Admitting: Physician Assistant

## 2019-09-20 ENCOUNTER — Other Ambulatory Visit: Payer: Self-pay | Admitting: Physician Assistant

## 2019-09-20 ENCOUNTER — Encounter: Payer: Self-pay | Admitting: Physician Assistant

## 2019-09-20 MED ORDER — AMPHETAMINE-DEXTROAMPHETAMINE 10 MG PO TABS: 10.0000 mg | ORAL_TABLET | Freq: Every day | ORAL | 0 refills | Status: DC

## 2019-09-23 ENCOUNTER — Other Ambulatory Visit: Payer: Self-pay | Admitting: Physician Assistant

## 2019-09-23 ENCOUNTER — Encounter: Payer: Self-pay | Admitting: Physician Assistant

## 2019-09-23 ENCOUNTER — Telehealth: Payer: Self-pay | Admitting: Physician Assistant

## 2019-09-23 MED ORDER — AMPHETAMINE-DEXTROAMPHETAMINE 10 MG PO TABS: 10.0000 mg | ORAL_TABLET | Freq: Every day | ORAL | 0 refills | Status: DC

## 2019-09-23 NOTE — Telephone Encounter (Signed)
Message already sent to [facility].

## 2019-09-23 NOTE — Telephone Encounter (Signed)
Pt called because her Adderall Rx was sent to [facility].  She needs it called in to the [facility] on [address].

## 2019-09-23 NOTE — Telephone Encounter (Signed)
Please resend Adderall Rx's

## 2019-12-23 ENCOUNTER — Telehealth: Payer: Self-pay | Admitting: Physician Assistant

## 2019-12-23 NOTE — Telephone Encounter (Signed)
Please call pt and schedule an appt to discuss medication refill.

## 2019-12-23 NOTE — Telephone Encounter (Signed)
MEDICATION: amphetamine-dextroamphetamine (ADDERALL) 10 MG tablet  PHARMACY:  [facility] [address] [place] - [address] Phone:  [phone]  Fax:  [phone]       Comments:   **Let patient know to contact pharmacy at the end of the day to make sure medication is ready. **  ** Please notify patient to allow 48-72 hours to process**  **Encourage patient to contact the pharmacy for refills or they can request refills through [facility]**

## 2019-12-23 NOTE — Telephone Encounter (Signed)
Patient has been scheduled for 12/25/19 with [name].

## 2019-12-25 ENCOUNTER — Ambulatory Visit (INDEPENDENT_AMBULATORY_CARE_PROVIDER_SITE_OTHER): Payer: Self-pay | Admitting: Physician Assistant

## 2019-12-25 ENCOUNTER — Encounter: Payer: Self-pay | Admitting: Physician Assistant

## 2019-12-25 ENCOUNTER — Other Ambulatory Visit: Payer: Self-pay

## 2019-12-25 VITALS — BP 130/70 | HR 82 | Temp 97.7°F | Ht 64.0 in | Wt 178.0 lb

## 2019-12-25 DIAGNOSIS — F909 Attention-deficit hyperactivity disorder, unspecified type: Secondary | ICD-10-CM

## 2019-12-25 DIAGNOSIS — F341 Dysthymic disorder: Secondary | ICD-10-CM

## 2019-12-25 DIAGNOSIS — T7840XS Allergy, unspecified, sequela: Secondary | ICD-10-CM

## 2019-12-25 MED ORDER — VENLAFAXINE HCL ER 150 MG PO CP24: 300.0000 mg | ORAL_CAPSULE | Freq: Every day | ORAL | 2 refills | Status: DC

## 2019-12-25 MED ORDER — AMPHETAMINE-DEXTROAMPHETAMINE 10 MG PO TABS
10.0000 mg | ORAL_TABLET | Freq: Every day | ORAL | 0 refills | Status: DC
Start: 2020-01-24 — End: 2020-04-06

## 2019-12-25 MED ORDER — AMPHETAMINE-DEXTROAMPHETAMINE 10 MG PO TABS
10.0000 mg | ORAL_TABLET | Freq: Every day | ORAL | 0 refills | Status: DC
Start: 2019-12-25 — End: 2020-04-06

## 2019-12-25 MED ORDER — ALBUTEROL SULFATE HFA 108 (90 BASE) MCG/ACT IN AERS
2.0000 | INHALATION_SPRAY | Freq: Four times a day (QID) | RESPIRATORY_TRACT | 1 refills | Status: DC | PRN
Start: 2019-12-25 — End: 2020-04-08

## 2019-12-25 MED ORDER — AMPHETAMINE-DEXTROAMPHETAMINE 10 MG PO TABS
10.0000 mg | ORAL_TABLET | Freq: Every day | ORAL | 0 refills | Status: DC
Start: 2020-02-23 — End: 2020-04-06

## 2019-12-25 NOTE — Progress Notes (Deleted)
Christina Cowan is a 59 y.o. female is here to discuss:  SCRIBE STATEMENT  History of Present Illness:   No chief complaint on file.   HPI  Health Maintenance Due  Topic Date Due  . COVID-19 Vaccine (1) Never done  . PAP SMEAR-Modifier  07/14/2015    Past Medical History:  Diagnosis Date  . Anxiety   . Arthritis   . Asthma   . Essential hypertension, benign 06/17/2014  . GERD (gastroesophageal reflux disease)   . Pure hypercholesterolemia 06/17/2014     Social History   Socioeconomic History  . Marital status: Widowed    Spouse name: Not on file  . Number of children: Not on file  . Years of education: Not on file  . Highest education level: Not on file  Occupational History  . Not on file  Tobacco Use  . Smoking status: Current Every Day Smoker    Packs/day: 0.40    Years: 16.00    Pack years: 6.40    Types: Cigarettes  . Smokeless tobacco: Never Used  Substance and Sexual Activity  . Alcohol use: Yes    Alcohol/week: 4.0 standard drinks    Types: 4 Glasses of wine per week    Comment: occ  . Drug use: Yes    Types: Hydrocodone  . Sexual activity: Yes    Birth control/protection: I.U.D.  Other Topics Concern  . Not on file  Social History Narrative  . Not on file   Social Determinants of Health   Financial Resource Strain:   . Difficulty of Paying Living Expenses:   Food Insecurity:   . Worried About Programme researcher, broadcasting/film/video in the Last Year:   . Barista in the Last Year:   Transportation Needs:   . Freight forwarder (Medical):   Marland Kitchen Lack of Transportation (Non-Medical):   Physical Activity:   . Days of Exercise per Week:   . Minutes of Exercise per Session:   Stress:   . Feeling of Stress :   Social Connections:   . Frequency of Communication with Friends and Family:   . Frequency of Social Gatherings with Friends and Family:   . Attends Religious Services:   . Active Member of Clubs or Organizations:   . Attends Tax inspector Meetings:   Marland Kitchen Marital Status:   Intimate Partner Violence:   . Fear of Current or Ex-Partner:   . Emotionally Abused:   Marland Kitchen Physically Abused:   . Sexually Abused:     Past Surgical History:  Procedure Laterality Date  . CESAREAN SECTION      Family History  Problem Relation Age of Onset  . Arthritis Mother   . Hypertension Mother   . Arthritis Father   . Colon cancer Paternal Uncle     PMHx, SurgHx, SocialHx, FamHx, Medications, and Allergies were reviewed in the Visit Navigator and updated as appropriate.   Patient Active Problem List   Diagnosis Date Noted  . Mixed hyperlipidemia 08/06/2019  . Essential hypertension, benign 06/17/2014  . Shingles rash 03/18/2014  . GERD 12/16/2009  . Vitamin D deficiency 12/24/2008  . Dysthymic disorder 07/24/2007  . Attention deficit disorder 07/24/2007    Social History   Tobacco Use  . Smoking status: Current Every Day Smoker    Packs/day: 0.40    Years: 16.00    Pack years: 6.40    Types: Cigarettes  . Smokeless tobacco: Never Used  Substance Use Topics  . Alcohol  use: Yes    Alcohol/week: 4.0 standard drinks    Types: 4 Glasses of wine per week    Comment: occ  . Drug use: Yes    Types: Hydrocodone    Current Medications and Allergies:    Current Outpatient Medications:  .  amphetamine-dextroamphetamine (ADDERALL) 10 MG tablet, Take 1 tablet (10 mg total) by mouth daily with breakfast., Disp: 30 tablet, Rfl: 0 .  amphetamine-dextroamphetamine (ADDERALL) 10 MG tablet, Take 1 tablet (10 mg total) by mouth daily with breakfast., Disp: 30 tablet, Rfl: 0 .  amphetamine-dextroamphetamine (ADDERALL) 10 MG tablet, Take 1 tablet (10 mg total) by mouth daily with breakfast., Disp: 30 tablet, Rfl: 0 .  atorvastatin (LIPITOR) 20 MG tablet, Take 1 tablet (20 mg total) by mouth daily., Disp: 90 tablet, Rfl: 3 .  hydrochlorothiazide (HYDRODIURIL) 25 MG tablet, TAKE ONE TABLET BY MOUTH DAILY, Disp: 90 tablet, Rfl: 1 .   losartan (COZAAR) 100 MG tablet, TAKE ONE TABLET BY MOUTH DAILY, Disp: 90 tablet, Rfl: 0 .  losartan-hydrochlorothiazide (HYZAAR) 100-25 MG tablet, Take 1 tablet by mouth daily., Disp: 90 tablet, Rfl: 3 .  venlafaxine XR (EFFEXOR-XR) 150 MG 24 hr capsule, Take 2 capsules (300 mg total) by mouth daily with breakfast., Disp: 180 capsule, Rfl: 2  Allergies  Allergen Reactions  . Prednisone     Review of Systems   ROS  Vitals:  There were no vitals filed for this visit.   There is no height or weight on file to calculate BMI.   Physical Exam:    Physical Exam   Assessment and Plan:    There are no diagnoses linked to this encounter.  . Reviewed expectations re: course of current medical issues. . Discussed self-management of symptoms. . Outlined signs and symptoms indicating need for more acute intervention. . Patient verbalized understanding and all questions were answered. . See orders for this visit as documented in the electronic medical record. . Patient received an After Visit Summary.  ***  Inda Coke, PA-C [place] 12/25/2019  Follow-up: No follow-ups on file.

## 2019-12-25 NOTE — Patient Instructions (Signed)
It was great to see you!  Refills have been sent.  Continue to work on smoking reduction.  Please get your covid vaccine.  Let's follow-up in 6 months, sooner if you have concerns.  Take care,  [name] PA-C

## 2019-12-25 NOTE — Progress Notes (Signed)
Christina Cowan is a 59 y.o. female is here for follow up.  I acted as a Neurosurgeon for [employer], PA-C Corky Mull, LPN   History of Present Illness:   Chief Complaint  Patient presents with  . ADD  . Allergies    HPI   ADD Pt following up today, currently taking Adderall 10 mg daily. She states is working well, no side effects noted, denies any issues with sleeping, chest pain, palpitations.  Allergies Pt c/o trouble breathing due to allergies, wheezing off and on mainly at night. Pt said she used to use Albuterol inhaler and would like a Rx. Pt is taking Allegra and Flonase. She is trying to work on smoking cessation.  Dysthymic Currently on effexor 300 mg daily, tolerating well without issues. Denies unusual side effects or concerns for SI/HI.  BP Readings from Last 3 Encounters:  12/25/19 130/70  08/06/19 130/80  06/19/18 138/86      Health Maintenance Due  Topic Date Due  . COVID-19 Vaccine (1) Never done  . PAP SMEAR-Modifier  07/14/2015    Past Medical History:  Diagnosis Date  . Anxiety   . Arthritis   . Asthma   . Essential hypertension, benign 06/17/2014  . GERD (gastroesophageal reflux disease)   . Pure hypercholesterolemia 06/17/2014     Social History   Socioeconomic History  . Marital status: Widowed    Spouse name: Not on file  . Number of children: Not on file  . Years of education: Not on file  . Highest education level: Not on file  Occupational History  . Not on file  Tobacco Use  . Smoking status: Current Every Day Smoker    Packs/day: 0.40    Years: 16.00    Pack years: 6.40    Types: Cigarettes  . Smokeless tobacco: Never Used  Substance and Sexual Activity  . Alcohol use: Yes    Alcohol/week: 4.0 standard drinks    Types: 4 Glasses of wine per week    Comment: occ  . Drug use: Yes    Types: Hydrocodone  . Sexual activity: Yes    Birth control/protection: I.U.D.  Other Topics Concern  . Not on file  Social History  Narrative  . Not on file   Social Determinants of Health   Financial Resource Strain:   . Difficulty of Paying Living Expenses:   Food Insecurity:   . Worried About Programme researcher, broadcasting/film/video in the Last Year:   . Barista in the Last Year:   Transportation Needs:   . Freight forwarder (Medical):   Marland Kitchen Lack of Transportation (Non-Medical):   Physical Activity:   . Days of Exercise per Week:   . Minutes of Exercise per Session:   Stress:   . Feeling of Stress :   Social Connections:   . Frequency of Communication with Friends and Family:   . Frequency of Social Gatherings with Friends and Family:   . Attends Religious Services:   . Active Member of Clubs or Organizations:   . Attends Banker Meetings:   Marland Kitchen Marital Status:   Intimate Partner Violence:   . Fear of Current or Ex-Partner:   . Emotionally Abused:   Marland Kitchen Physically Abused:   . Sexually Abused:     Past Surgical History:  Procedure Laterality Date  . CESAREAN SECTION      Family History  Problem Relation Age of Onset  . Arthritis Mother   . Hypertension  Mother   . Arthritis Father   . Colon cancer Paternal Uncle     PMHx, SurgHx, SocialHx, FamHx, Medications, and Allergies were reviewed in the Visit Navigator and updated as appropriate.   Patient Active Problem List   Diagnosis Date Noted  . Mixed hyperlipidemia 08/06/2019  . Essential hypertension, benign 06/17/2014  . Shingles rash 03/18/2014  . GERD 12/16/2009  . Vitamin D deficiency 12/24/2008  . Dysthymic disorder 07/24/2007  . Attention deficit disorder 07/24/2007    Social History   Tobacco Use  . Smoking status: Current Every Day Smoker    Packs/day: 0.40    Years: 16.00    Pack years: 6.40    Types: Cigarettes  . Smokeless tobacco: Never Used  Substance Use Topics  . Alcohol use: Yes    Alcohol/week: 4.0 standard drinks    Types: 4 Glasses of wine per week    Comment: occ  . Drug use: Yes    Types: Hydrocodone     Current Medications and Allergies:    Current Outpatient Medications:  .  atorvastatin (LIPITOR) 20 MG tablet, Take 1 tablet (20 mg total) by mouth daily., Disp: 90 tablet, Rfl: 3 .  fexofenadine (ALLEGRA) 180 MG tablet, Take 180 mg by mouth daily., Disp: , Rfl:  .  fluticasone (FLONASE) 50 MCG/ACT nasal spray, Place 1 spray into both nostrils daily., Disp: , Rfl:  .  losartan-hydrochlorothiazide (HYZAAR) 100-25 MG tablet, Take 1 tablet by mouth daily., Disp: 90 tablet, Rfl: 3 .  albuterol (VENTOLIN HFA) 108 (90 Base) MCG/ACT inhaler, Inhale 2 puffs into the lungs every 6 (six) hours as needed for wheezing or shortness of breath., Disp: 18 g, Rfl: 1 .  amphetamine-dextroamphetamine (ADDERALL) 10 MG tablet, Take 1 tablet (10 mg total) by mouth daily with breakfast., Disp: 30 tablet, Rfl: 0 .  [START ON 01/24/2020] amphetamine-dextroamphetamine (ADDERALL) 10 MG tablet, Take 1 tablet (10 mg total) by mouth daily with breakfast., Disp: 30 tablet, Rfl: 0 .  [START ON 02/23/2020] amphetamine-dextroamphetamine (ADDERALL) 10 MG tablet, Take 1 tablet (10 mg total) by mouth daily with breakfast., Disp: 30 tablet, Rfl: 0 .  venlafaxine XR (EFFEXOR-XR) 150 MG 24 hr capsule, Take 2 capsules (300 mg total) by mouth daily with breakfast., Disp: 180 capsule, Rfl: 2   Allergies  Allergen Reactions  . Prednisone     Review of Systems   ROS Negative unless otherwise specified per HPI.  Vitals:   Vitals:   12/25/19 1343  BP: 130/70  Pulse: 82  Temp: 97.7 F (36.5 C)  TempSrc: Temporal  SpO2: 96%  Weight: 178 lb (80.7 kg)  Height: 5\' 4"  (1.626 m)     Body mass index is 30.55 kg/m.   Physical Exam:    Physical Exam Vitals and nursing note reviewed.  Constitutional:      General: She is not in acute distress.    Appearance: She is well-developed. She is not ill-appearing or toxic-appearing.  Cardiovascular:     Rate and Rhythm: Normal rate and regular rhythm.     Pulses: Normal pulses.      Heart sounds: Normal heart sounds, S1 normal and S2 normal.     Comments: No LE edema Pulmonary:     Effort: Pulmonary effort is normal.     Breath sounds: Normal breath sounds.  Skin:    General: Skin is warm and dry.  Neurological:     Mental Status: She is alert.     GCS: GCS eye subscore is  4. GCS verbal subscore is 5. GCS motor subscore is 6.  Psychiatric:        Speech: Speech normal.        Behavior: Behavior normal. Behavior is cooperative.      Assessment and Plan:    Makia was seen today for add and allergies.  Diagnoses and all orders for this visit:  Attention deficit hyperactivity disorder (ADHD), unspecified ADHD type [place] Controlled Substance Database reviewed today regarding patient. Patient is compliant with [facility] regarding pharmacy use and one-prescribing provider. It is appropriate to continue current medication regimen. Will refill x 6 months. Follow-up if any concerns. Patient is aware of cardiac risks with these medications.  Dysthymic disorder Tolerating effexor well without significant issues. Continue effexor as prescribed. -     venlafaxine XR (EFFEXOR-XR) 150 MG 24 hr capsule; Take 2 capsules (300 mg total) by mouth daily with breakfast.  Allergy, sequela Albuterol prn. Continue allegra and flonase. Work on smoking cessation. Follow-up if any concerns.  Other orders -     amphetamine-dextroamphetamine (ADDERALL) 10 MG tablet; Take 1 tablet (10 mg total) by mouth daily with breakfast. -     amphetamine-dextroamphetamine (ADDERALL) 10 MG tablet; Take 1 tablet (10 mg total) by mouth daily with breakfast. -     amphetamine-dextroamphetamine (ADDERALL) 10 MG tablet; Take 1 tablet (10 mg total) by mouth daily with breakfast. -     albuterol (VENTOLIN HFA) 108 (90 Base) MCG/ACT inhaler; Inhale 2 puffs into the lungs every 6 (six) hours as needed for wheezing or shortness of breath.  . Reviewed expectations re: course of current medical issues. . Discussed  self-management of symptoms. . Outlined signs and symptoms indicating need for more acute intervention. . Patient verbalized understanding and all questions were answered. . See orders for this visit as documented in the electronic medical record. . Patient received an After Visit Summary.  CMA or LPN served as scribe during this visit. History, Physical, and Plan performed by medical provider. The above documentation has been reviewed and is accurate and complete.  [name], PA-C [place] 12/25/2019  Follow-up: No follow-ups on file.

## 2020-01-15 ENCOUNTER — Encounter: Payer: Self-pay | Admitting: Physician Assistant

## 2020-01-15 ENCOUNTER — Ambulatory Visit (INDEPENDENT_AMBULATORY_CARE_PROVIDER_SITE_OTHER): Payer: Self-pay | Admitting: Physician Assistant

## 2020-01-15 ENCOUNTER — Telehealth: Payer: Self-pay | Admitting: Physician Assistant

## 2020-01-15 ENCOUNTER — Other Ambulatory Visit: Payer: Self-pay

## 2020-01-15 VITALS — BP 144/80 | HR 89 | Temp 98.0°F | Ht 64.0 in | Wt 176.6 lb

## 2020-01-15 DIAGNOSIS — M545 Low back pain, unspecified: Secondary | ICD-10-CM

## 2020-01-15 MED ORDER — CYCLOBENZAPRINE HCL 10 MG PO TABS
10.0000 mg | ORAL_TABLET | Freq: Three times a day (TID) | ORAL | 0 refills | Status: DC | PRN
Start: 2020-01-15 — End: 2020-04-08

## 2020-01-15 MED ORDER — KETOROLAC TROMETHAMINE 60 MG/2ML IM SOLN
60.0000 mg | Freq: Once | INTRAMUSCULAR | Status: AC
  Administered 2020-01-15: 60 mg via INTRAMUSCULAR

## 2020-01-15 MED ORDER — METHYLPREDNISOLONE ACETATE 40 MG/ML IJ SUSP
40.0000 mg | Freq: Once | INTRAMUSCULAR | Status: AC
  Administered 2020-01-15: 40 mg via INTRAMUSCULAR

## 2020-01-15 NOTE — Patient Instructions (Signed)
It was great to see you!  You have received a toradol and prednisone injection today for your back pain.  May take tylenol and ibuprofen tomorrow if needed.  Muscle relaxers have also been sent in.  If not improved by Friday, let me know! If worsening, go to the Urgent Care or ER.  Take care,  [name] PA-C

## 2020-01-15 NOTE — Telephone Encounter (Signed)
Patient was told to go to the ED   Reason for Call Symptomatic / Request for Health Information Initial Comment Caller states she has her COVID shot and she has been experiencing severe back pain. GOTO Facility Not Listed [facility] No Nurse Assessment Nurse: Clarita Leber, RN, Gavin Pound Date/Time (Eastern Time): 01/15/2020 9:26:56 AM Confirm and document reason for call. If symptomatic, describe symptoms. ---The caller states that she had a COVID vaccine on Sunday and she is having back pain. This is lower back pain and down to her knees. She states that she is having trouble standing. Using ice and heat. No other symptoms. Only history of back issues was earlier in life due to an injury was mid back. No fever. First vaccine of [employer]. Has the patient had close contact with a person known or suspected to have the novel coronavirus illness OR traveled / lives in area with major community spread (including international travel) in the last 14 days from the onset of symptoms? * If Asymptomatic, screen for exposure and travel within the last 14 days. ---No Does the patient have any new or worsening symptoms? ---Yes Will a triage be completed? ---Yes Related visit to physician within the last 2 weeks? ---No Does the PT have any chronic conditions? (i.e. diabetes, asthma, this includes High risk factors for pregnancy, etc.) ---Yes List chronic conditions. ---hypertension Is this a behavioral health or substance abuse call? ---No Guidelines Guideline Title Affirmed Question Affirmed Notes Nurse Date/Time (Eastern Time) Back Pain [1] SEVERE back pain (e.g., excruciating) AND [place]omble, RN, Gavin Pound 01/15/2020 9:32:33 AMPLEASE NOTE: All timestamps contained within this report are represented as [place] Standard Time. CONFIDENTIALTY NOTICE: This fax transmission is intended only for the addressee. It contains information that is legally privileged, confidential or otherwise protected  from use or disclosure. If you are not the intended recipient, you are strictly prohibited from reviewing, disclosing, copying using or disseminating any of this information or taking any action in reliance on or regarding this information. If you have received this fax in error, please notify us immediately by telephone so that we can arrange for its return to [place]. Phone: [phone], Toll-Free: [phone], Fax: [phone] Page: 2 of 2 Call Id: [id number] Guidelines Guideline Title Affirmed Question Affirmed Notes Nurse Date/Time Lamount Cohen Time) [2] sudden onset AND [3] age > 60 years Disp. Time Lamount Cohen Time) Disposition Final User 01/15/2020 9:35:43 AM Go to ED Now Yes Clarita Leber, RN, Jetty Duhamel Disagree/Comply Comply Caller Understands Yes PreDisposition Go to ED Care Advice Given Per Guideline GO TO ED NOW: * It is better and safer if another adult drives instead of you. Referrals GO TO FACILITY OTHER - SPECIF

## 2020-01-15 NOTE — Telephone Encounter (Signed)
FYI. Triage note 

## 2020-01-15 NOTE — Telephone Encounter (Signed)
Spoke with patient. Please schedule patient to be seen today for back pain.

## 2020-01-15 NOTE — Telephone Encounter (Signed)
Patient phoned in this morning stating she had received her second COVID vac a few days ago and now is experiencing extreme back pain.  States she feels like someone had hit her in the back with a baseball bat.  I have sent patient over to [facility] for triage. Waiting on triage notes.

## 2020-01-15 NOTE — Progress Notes (Signed)
Christina Cowan is a 59 y.o. female here for back pain.  I acted as a Neurosurgeon for [employer], PA-C [employer], [place]  History of Present Illness:   Chief Complaint  Patient presents with  . Back Pain    HPI  Back Pain Patient c/o low back pain that radiates down to both knees. Symptoms started Sunday night pain and pain is worse now. She has tried Tylenol to relieve pain, as well as alternated heat and ice.Denies any precipitating event. Did have first COVID vaxx on Sunday afternoon.  Denies weakness because of pain, falls, dysuria, fever, chills, incontinence, saddle anesthesia  Past Medical History:  Diagnosis Date  . Anxiety   . Arthritis   . Asthma   . Essential hypertension, benign 06/17/2014  . GERD (gastroesophageal reflux disease)   . Pure hypercholesterolemia 06/17/2014     Social History   Socioeconomic History  . Marital status: Widowed    Spouse name: Not on file  . Number of children: Not on file  . Years of education: Not on file  . Highest education level: Not on file  Occupational History  . Not on file  Tobacco Use  . Smoking status: Current Every Day Smoker    Packs/day: 0.40    Years: 16.00    Pack years: 6.40    Types: Cigarettes  . Smokeless tobacco: Never Used  Substance and Sexual Activity  . Alcohol use: Yes    Alcohol/week: 4.0 standard drinks    Types: 4 Glasses of wine per week    Comment: occ  . Drug use: Yes    Types: Hydrocodone  . Sexual activity: Yes    Birth control/protection: I.U.D.  Other Topics Concern  . Not on file  Social History Narrative  . Not on file   Social Determinants of Health   Financial Resource Strain:   . Difficulty of Paying Living Expenses:   Food Insecurity:   . Worried About Programme researcher, broadcasting/film/video in the Last Year:   . Barista in the Last Year:   Transportation Needs:   . Freight forwarder (Medical):   Marland Kitchen Lack of Transportation (Non-Medical):   Physical Activity:   . Days of  Exercise per Week:   . Minutes of Exercise per Session:   Stress:   . Feeling of Stress :   Social Connections:   . Frequency of Communication with Friends and Family:   . Frequency of Social Gatherings with Friends and Family:   . Attends Religious Services:   . Active Member of Clubs or Organizations:   . Attends Banker Meetings:   Marland Kitchen Marital Status:   Intimate Partner Violence:   . Fear of Current or Ex-Partner:   . Emotionally Abused:   Marland Kitchen Physically Abused:   . Sexually Abused:     Past Surgical History:  Procedure Laterality Date  . CESAREAN SECTION      Family History  Problem Relation Age of Onset  . Arthritis Mother   . Hypertension Mother   . Arthritis Father   . Colon cancer Paternal Uncle     Allergies  Allergen Reactions  . Prednisone Other (See Comments)    Bad thoughts    Current Medications:   Current Outpatient Medications:  .  albuterol (VENTOLIN HFA) 108 (90 Base) MCG/ACT inhaler, Inhale 2 puffs into the lungs every 6 (six) hours as needed for wheezing or shortness of breath., Disp: 18 g, Rfl: 1 .  amphetamine-dextroamphetamine (  ADDERALL) 10 MG tablet, Take 1 tablet (10 mg total) by mouth daily with breakfast., Disp: 30 tablet, Rfl: 0 .  [START ON 01/24/2020] amphetamine-dextroamphetamine (ADDERALL) 10 MG tablet, Take 1 tablet (10 mg total) by mouth daily with breakfast., Disp: 30 tablet, Rfl: 0 .  [START ON 02/23/2020] amphetamine-dextroamphetamine (ADDERALL) 10 MG tablet, Take 1 tablet (10 mg total) by mouth daily with breakfast., Disp: 30 tablet, Rfl: 0 .  atorvastatin (LIPITOR) 20 MG tablet, Take 1 tablet (20 mg total) by mouth daily., Disp: 90 tablet, Rfl: 3 .  fexofenadine (ALLEGRA) 180 MG tablet, Take 180 mg by mouth daily., Disp: , Rfl:  .  fluticasone (FLONASE) 50 MCG/ACT nasal spray, Place 1 spray into both nostrils daily., Disp: , Rfl:  .  losartan-hydrochlorothiazide (HYZAAR) 100-25 MG tablet, Take 1 tablet by mouth daily., Disp:  90 tablet, Rfl: 3 .  venlafaxine XR (EFFEXOR-XR) 150 MG 24 hr capsule, Take 2 capsules (300 mg total) by mouth daily with breakfast., Disp: 180 capsule, Rfl: 2 .  cyclobenzaprine (FLEXERIL) 10 MG tablet, Take 1 tablet (10 mg total) by mouth 3 (three) times daily as needed for muscle spasms., Disp: 30 tablet, Rfl: 0   Review of Systems:   ROS  Negative unless otherwise specified per HPI.  Vitals:   Vitals:   01/15/20 1415  BP: (!) 144/80  Pulse: 89  Temp: 98 F (36.7 C)  TempSrc: Temporal  SpO2: 99%  Weight: 176 lb 9.6 oz (80.1 kg)  Height: 5\' 4"  (1.626 m)     Body mass index is 30.31 kg/m.  Physical Exam:   Physical Exam Constitutional:      Appearance: She is well-developed.  HENT:     Head: Normocephalic and atraumatic.  Eyes:     Conjunctiva/sclera: Conjunctivae normal.  Pulmonary:     Effort: Pulmonary effort is normal.  Musculoskeletal:        General: Normal range of motion.     Cervical back: Normal range of motion and neck supple.     Comments: Decreased ROM 2/2 pain with flexion/extension, lateral side bends, or rotation. Reproducible tenderness with deep palpation to bilateral paraspinal lumbar muscles. No bony tenderness.    Skin:    General: Skin is warm and dry.  Neurological:     Mental Status: She is alert and oriented to person, place, and time.     Cranial Nerves: Cranial nerves are intact.     Sensory: Sensation is intact.     Motor: Motor function is intact.     Coordination: Coordination is intact.  Psychiatric:        Behavior: Behavior normal.        Thought Content: Thought content normal.        Judgment: Judgment normal.       Assessment and Plan:   [place] was seen today for back pain.  Diagnoses and all orders for this visit:  Lumbar back pain No red flags on discussion/exam. She received toradol and depo-medrol injection without any issues. (Patient does have a prednisone allergy -- but states that she can tolerate prednisone  injections.) Also gave rx for flexeril. Patient declined imaging today. If lack of improvement, recommend return to clinic for imaging. If worsening symptoms, needs urgent/ER evaluation. -     ketorolac (TORADOL) injection 60 mg -     methylPREDNISolone acetate (DEPO-MEDROL) injection 40 mg  Other orders -     cyclobenzaprine (FLEXERIL) 10 MG tablet; Take 1 tablet (10 mg total) by mouth  3 (three) times daily as needed for muscle spasms.  . Reviewed expectations re: course of current medical issues. . Discussed self-management of symptoms. . Outlined signs and symptoms indicating need for more acute intervention. . Patient verbalized understanding and all questions were answered. . See orders for this visit as documented in the electronic medical record. . Patient received an After-Visit Summary.  CMA or LPN served as scribe during this visit. History, Physical, and Plan performed by medical provider. The above documentation has been reviewed and is accurate and complete.  [name], PA-C

## 2020-01-21 ENCOUNTER — Telehealth: Payer: Self-pay | Admitting: Physician Assistant

## 2020-01-21 NOTE — Telephone Encounter (Signed)
Patient called in this morning to let [name] know that she tested positive for covid on Friday and would like to know what she needs to do.

## 2020-01-21 NOTE — Telephone Encounter (Signed)
Spoke with patient and informed her. °

## 2020-01-23 ENCOUNTER — Encounter: Payer: Self-pay | Admitting: Family Medicine

## 2020-01-23 ENCOUNTER — Telehealth (INDEPENDENT_AMBULATORY_CARE_PROVIDER_SITE_OTHER): Payer: Self-pay | Admitting: Family Medicine

## 2020-01-23 DIAGNOSIS — U071 COVID-19: Secondary | ICD-10-CM

## 2020-01-23 DIAGNOSIS — I10 Essential (primary) hypertension: Secondary | ICD-10-CM

## 2020-01-23 DIAGNOSIS — J4520 Mild intermittent asthma, uncomplicated: Secondary | ICD-10-CM

## 2020-01-23 MED ORDER — BENZONATATE 100 MG PO CAPS: 100.0000 mg | ORAL_CAPSULE | Freq: Three times a day (TID) | ORAL | 0 refills | Status: DC | PRN

## 2020-01-23 NOTE — Progress Notes (Signed)
Virtual Visit via Video Note  I connected with Christina Cowan  on 01/23/20 at  3:20 PM EDT by a video enabled telemedicine application and verified that I am speaking with the correct person using two identifiers.  Location patient: home, [place] Location provider:work or home office Persons participating in the virtual visit: patient, provider  I discussed the limitations of evaluation and management by telemedicine and the availability of in person appointments. The patient expressed understanding and agreed to proceed.   HPI:  Acute visit for COVID19: -got first dose of pfizer on the 16th of MAy -she had close exposure to a covid positive family on Friday the 14th  -her symptoms started the 22nd, she had some body aches, a fever for 2-3 days (non the last few days), headaches, cough, lots of mucus -she had a positive covid19 test on the 21st -denies SOB, CP, GI smx loss of taste or smell, worst headache of life - headaches improving improving -PMH asthma - only requires inhaler as needed, HTN, overweight  ROS: See pertinent positives and negatives per HPI.  Past Medical History:  Diagnosis Date  . Anxiety   . Arthritis   . Asthma   . Essential hypertension, benign 06/17/2014  . GERD (gastroesophageal reflux disease)   . Pure hypercholesterolemia 06/17/2014    Past Surgical History:  Procedure Laterality Date  . CESAREAN SECTION      Family History  Problem Relation Age of Onset  . Arthritis Mother   . Hypertension Mother   . Arthritis Father   . Colon cancer Paternal Uncle     SOCIAL HX: see hpi   Current Outpatient Medications:  .  albuterol (VENTOLIN HFA) 108 (90 Base) MCG/ACT inhaler, Inhale 2 puffs into the lungs every 6 (six) hours as needed for wheezing or shortness of breath., Disp: 18 g, Rfl: 1 .  amphetamine-dextroamphetamine (ADDERALL) 10 MG tablet, Take 1 tablet (10 mg total) by mouth daily with breakfast., Disp: 30 tablet, Rfl: 0 .  [START ON 01/24/2020]  amphetamine-dextroamphetamine (ADDERALL) 10 MG tablet, Take 1 tablet (10 mg total) by mouth daily with breakfast., Disp: 30 tablet, Rfl: 0 .  [START ON 02/23/2020] amphetamine-dextroamphetamine (ADDERALL) 10 MG tablet, Take 1 tablet (10 mg total) by mouth daily with breakfast., Disp: 30 tablet, Rfl: 0 .  atorvastatin (LIPITOR) 20 MG tablet, Take 1 tablet (20 mg total) by mouth daily., Disp: 90 tablet, Rfl: 3 .  cyclobenzaprine (FLEXERIL) 10 MG tablet, Take 1 tablet (10 mg total) by mouth 3 (three) times daily as needed for muscle spasms., Disp: 30 tablet, Rfl: 0 .  fexofenadine (ALLEGRA) 180 MG tablet, Take 180 mg by mouth daily., Disp: , Rfl:  .  fluticasone (FLONASE) 50 MCG/ACT nasal spray, Place 1 spray into both nostrils daily., Disp: , Rfl:  .  losartan-hydrochlorothiazide (HYZAAR) 100-25 MG tablet, Take 1 tablet by mouth daily., Disp: 90 tablet, Rfl: 3 .  venlafaxine XR (EFFEXOR-XR) 150 MG 24 hr capsule, Take 2 capsules (300 mg total) by mouth daily with breakfast., Disp: 180 capsule, Rfl: 2 .  benzonatate (TESSALON PERLES) 100 MG capsule, Take 1 capsule (100 mg total) by mouth 3 (three) times daily as needed., Disp: 20 capsule, Rfl: 0  EXAM:  VITALS per patient if applicable:  GENERAL: alert, oriented, appears well and in no acute distress  HEENT: atraumatic, conjunttiva clear, no obvious abnormalities on inspection of external nose and ears  NECK: normal movements of the head and neck  LUNGS: on inspection no signs of respiratory  distress, breathing rate appears normal, no obvious gross SOB, gasping or wheezing  CV: no obvious cyanosis  MS: moves all visible extremities without noticeable abnormality  PSYCH/NEURO: pleasant and cooperative, no obvious depression or anxiety, speech and thought processing grossly intact  ASSESSMENT AND PLAN:  Discussed the following assessment and plan:  2019 novel coronavirus detected  Intermittent asthma without complication, unspecified  asthma severity  Essential hypertension, benign  -we discussed possible serious and likely etiologies, options for evaluation and workup, limitations of telemedicine visit vs in person visit, treatment, treatment risks and precautions. Pt prefers to treat via telemedicine empirically rather then risking or undertaking an in person visit at this moment. COVID19 in patient with risk factors. Seems to be improving today per her report with no severe symptoms. Did discuss potential course, short term and long term potential complication and precautions, options for outpt treatment. She is interested in finding out more about mab and I provided her with the number to call and discussed. Discussed home isolation. She report HTN is well controlled and that asthma is mild without significant issues currently. Reports has plenty of her inhaler an declined refill. Tessalon sent for cough. Patient agrees to seek prompt in person care if worsening, new symptoms arise, or if is not improving with treatment. She declined scheduled follow up offered.   I discussed the assessment and treatment plan with the patient. The patient was provided an opportunity to ask questions and all were answered. The patient agreed with the plan and demonstrated an understanding of the instructions.   The patient was advised to call back or seek an in-person evaluation if the symptoms worsen or if the condition fails to improve as anticipated.   Terressa Koyanagi, DO

## 2020-02-26 ENCOUNTER — Encounter: Payer: Self-pay | Admitting: Physician Assistant

## 2020-04-03 ENCOUNTER — Other Ambulatory Visit: Payer: Self-pay | Admitting: Physician Assistant

## 2020-04-03 DIAGNOSIS — F909 Attention-deficit hyperactivity disorder, unspecified type: Secondary | ICD-10-CM

## 2020-04-03 NOTE — Telephone Encounter (Signed)
Patient is calling and requesting a refill for Adderall sent to [facility] [address]. CB is [phone]

## 2020-04-06 MED ORDER — AMPHETAMINE-DEXTROAMPHETAMINE 10 MG PO TABS: 10.0000 mg | ORAL_TABLET | Freq: Every day | ORAL | 0 refills | Status: DC

## 2020-04-06 NOTE — Telephone Encounter (Signed)
LAST APPOINTMENT DATE: 01/15/2020   NEXT APPOINTMENT DATE: 04/08/2020    LAST REFILL: 02/17/20  QTY: 90 Tablets

## 2020-04-08 ENCOUNTER — Other Ambulatory Visit: Payer: Self-pay

## 2020-04-08 ENCOUNTER — Encounter: Payer: Self-pay | Admitting: Physician Assistant

## 2020-04-08 ENCOUNTER — Ambulatory Visit: Payer: Self-pay | Admitting: Physician Assistant

## 2020-04-08 VITALS — BP 138/82 | HR 70 | Temp 97.2°F | Ht 64.0 in | Wt 175.6 lb

## 2020-04-08 DIAGNOSIS — J4520 Mild intermittent asthma, uncomplicated: Secondary | ICD-10-CM

## 2020-04-08 DIAGNOSIS — H938X2 Other specified disorders of left ear: Secondary | ICD-10-CM

## 2020-04-08 DIAGNOSIS — Z23 Encounter for immunization: Secondary | ICD-10-CM

## 2020-04-08 DIAGNOSIS — F341 Dysthymic disorder: Secondary | ICD-10-CM

## 2020-04-08 DIAGNOSIS — I10 Essential (primary) hypertension: Secondary | ICD-10-CM

## 2020-04-08 DIAGNOSIS — E782 Mixed hyperlipidemia: Secondary | ICD-10-CM

## 2020-04-08 DIAGNOSIS — F909 Attention-deficit hyperactivity disorder, unspecified type: Secondary | ICD-10-CM

## 2020-04-08 MED ORDER — ALBUTEROL SULFATE HFA 108 (90 BASE) MCG/ACT IN AERS: 2.0000 | INHALATION_SPRAY | Freq: Four times a day (QID) | RESPIRATORY_TRACT | 1 refills | Status: DC | PRN

## 2020-04-08 MED ORDER — VENLAFAXINE HCL ER 150 MG PO CP24: 300.0000 mg | ORAL_CAPSULE | Freq: Every day | ORAL | 2 refills | Status: DC

## 2020-04-08 MED ORDER — AMPHETAMINE-DEXTROAMPHETAMINE 10 MG PO TABS: 10.0000 mg | ORAL_TABLET | Freq: Every day | ORAL | 0 refills | Status: DC

## 2020-04-08 MED ORDER — LOSARTAN POTASSIUM-HCTZ 100-25 MG PO TABS: 1.0000 | ORAL_TABLET | Freq: Every day | ORAL | 3 refills | Status: DC

## 2020-04-08 NOTE — Patient Instructions (Signed)
It was great to see you!  Please get your second COVID vaccine when it is due.  Trial the [facility] and see if this makes a significant difference for you.  You can do one puff in the AM and the PM.  We updated your tetanus vaccine today.  Your hearing test failed. Please go to an audiologist for further evaluation.  We will be in touch with your lab results.  Take care,  [name] PA-C

## 2020-04-08 NOTE — Addendum Note (Signed)
Addended byGracy Racer on: 04/08/2020 08:33 AM   Modules accepted: Orders

## 2020-04-08 NOTE — Progress Notes (Signed)
Christina Cowan is a 59 y.o. female is here to discuss: medication follow up  I acted as a Neurosurgeon for [employer], PA-C Gracy Racer, [place]  History of Present Illness:   Chief Complaint  Patient presents with  . ADD  . Hypertension    HPI   ADD Pt following up on medication. Currently taking Adderall 10 MG daily. Medication is working well for patient.  Denies: palpitations or insomnia.  HTN Pt currently taking losartan-HCTZ 100-25 MG daily. Pt checks BP readings at home. Readings are <140/90. Denies HA, dizziness, SOB, chest pain , and visual changes.  Asthma Currently using albuterol pretty regularly up to 2-3 times a week. Humidity triggers it. She feels wheezing. She is trying to continue to work on smoking cessation.   HLD Currently taking lipitor 20 mg daily. Tolerates this well. Takes it at night.  Dysthymia Currently on effexor 300 mg daily. Tolerating this well. Denies SI/HI.  L ear fullness Using flonase regularly. Has decreased hearing and sensation of fullness. Denies pain or recent URI symptoms.   Health Maintenance Due  Topic Date Due  . COLON CANCER SCREENING ANNUAL FOBT  Never done  . PAP SMEAR-Modifier  07/14/2015  . COVID-19 Vaccine (2 - Pfizer 2-dose series) 02/02/2020  . INFLUENZA VACCINE  03/29/2020    Past Medical History:  Diagnosis Date  . Anxiety   . Arthritis   . Asthma   . Essential hypertension, benign 06/17/2014  . GERD (gastroesophageal reflux disease)   . Pure hypercholesterolemia 06/17/2014     Social History   Tobacco Use  . Smoking status: Current Every Day Smoker    Packs/day: 0.40    Years: 16.00    Pack years: 6.40    Types: Cigarettes  . Smokeless tobacco: Never Used  Substance Use Topics  . Alcohol use: Yes    Alcohol/week: 4.0 standard drinks    Types: 4 Glasses of wine per week    Comment: occ  . Drug use: Yes    Types: Hydrocodone    Past Surgical History:  Procedure Laterality Date  . CESAREAN SECTION       Family History  Problem Relation Age of Onset  . Arthritis Mother   . Hypertension Mother   . Arthritis Father   . Colon cancer Paternal Uncle     PMHx, SurgHx, SocialHx, FamHx, Medications, and Allergies were reviewed in the Visit Navigator and updated as appropriate.   Patient Active Problem List   Diagnosis Date Noted  . Mixed hyperlipidemia 08/06/2019  . Essential hypertension, benign 06/17/2014  . Shingles rash 03/18/2014  . GERD 12/16/2009  . Vitamin D deficiency 12/24/2008  . Dysthymic disorder 07/24/2007  . Attention deficit disorder 07/24/2007    Social History   Tobacco Use  . Smoking status: Current Every Day Smoker    Packs/day: 0.40    Years: 16.00    Pack years: 6.40    Types: Cigarettes  . Smokeless tobacco: Never Used  Substance Use Topics  . Alcohol use: Yes    Alcohol/week: 4.0 standard drinks    Types: 4 Glasses of wine per week    Comment: occ  . Drug use: Yes    Types: Hydrocodone    Current Medications and Allergies:    Current Outpatient Medications:  .  atorvastatin (LIPITOR) 20 MG tablet, Take 1 tablet (20 mg total) by mouth daily., Disp: 90 tablet, Rfl: 3 .  fexofenadine (ALLEGRA) 180 MG tablet, Take 180 mg by mouth daily., Disp: ,  Rfl:  .  fluticasone (FLONASE) 50 MCG/ACT nasal spray, Place 1 spray into both nostrils daily., Disp: , Rfl:  .  albuterol (VENTOLIN HFA) 108 (90 Base) MCG/ACT inhaler, Inhale 2 puffs into the lungs every 6 (six) hours as needed for wheezing or shortness of breath., Disp: 18 g, Rfl: 1 .  [START ON 06/05/2020] amphetamine-dextroamphetamine (ADDERALL) 10 MG tablet, Take 1 tablet (10 mg total) by mouth daily with breakfast., Disp: 30 tablet, Rfl: 0 .  losartan-hydrochlorothiazide (HYZAAR) 100-25 MG tablet, Take 1 tablet by mouth daily., Disp: 90 tablet, Rfl: 3 .  venlafaxine XR (EFFEXOR-XR) 150 MG 24 hr capsule, Take 2 capsules (300 mg total) by mouth daily with breakfast., Disp: 180 capsule, Rfl:  2   Allergies  Allergen Reactions  . Prednisone Other (See Comments)    Bad thoughts -- only with oral prednisone, can tolerate injection per patient 01/15/20    Review of Systems   ROS  Negative unless otherwise specified per HPI.  Vitals:   Vitals:   04/08/20 0747  BP: 138/82  Pulse: 70  Temp: (!) 97.2 F (36.2 C)  TempSrc: Temporal  SpO2: 98%  Weight: 175 lb 9.6 oz (79.7 kg)  Height: 5\' 4"  (1.626 m)     Body mass index is 30.14 kg/m.   Physical Exam:    Physical Exam Vitals and nursing note reviewed.  Constitutional:      General: She is not in acute distress.    Appearance: She is well-developed. She is not ill-appearing or toxic-appearing.  HENT:     Right Ear: Tympanic membrane, ear canal and external ear normal. Tympanic membrane is not injected, scarred, perforated or erythematous.     Left Ear: Tympanic membrane, ear canal and external ear normal. Tympanic membrane is not injected, scarred, perforated or erythematous.  Cardiovascular:     Rate and Rhythm: Normal rate and regular rhythm.     Pulses: Normal pulses.     Heart sounds: Normal heart sounds, S1 normal and S2 normal.     Comments: No LE edema Pulmonary:     Effort: Pulmonary effort is normal.     Breath sounds: Normal breath sounds.  Skin:    General: Skin is warm and dry.  Neurological:     Mental Status: She is alert.     GCS: GCS eye subscore is 4. GCS verbal subscore is 5. GCS motor subscore is 6.  Psychiatric:        Speech: Speech normal.        Behavior: Behavior normal. Behavior is cooperative.      Assessment and Plan:    Christina Cowan was seen today for add and hypertension.  Diagnoses and all orders for this visit:  Attention deficit hyperactivity disorder (ADHD), unspecified ADHD type Well controlled. Continue Adderall 10 mg daily. Follow-up in 6 months, sooner if concerns. -     amphetamine-dextroamphetamine (ADDERALL) 10 MG tablet; Take 1 tablet (10 mg total) by mouth daily  with breakfast. -     CBC with Differential/Platelet; Future -     Comprehensive metabolic panel; Future -     Comprehensive metabolic panel -     CBC with Differential/Platelet  Dysthymic disorder Currently well controlled. Continue Effexor 300 mg daily. Follow-up in 1 year for this, sooner. -     venlafaxine XR (EFFEXOR-XR) 150 MG 24 hr capsule; Take 2 capsules (300 mg total) by mouth daily with breakfast. -     CBC with Differential/Platelet; Future -  Comprehensive metabolic panel; Future -     Comprehensive metabolic panel -     CBC with Differential/Platelet  Essential hypertension, benign Currently well controlled. Continue Losartan-HCTZ 100-25 mg.  Mixed hyperlipidemia Update lipid panel today. Will likely continue lipitor, may need to increase dosage.  -     Lipid panel; Future -     Lipid panel  Intermittent asthma without complication, unspecified asthma severity Did provide refill on albuterol. Will also provide Breztri inhalers for her to trial.  Ear fullness, left She failed hearing test.  Recommend audiology testing.  Need for Tdap vaccination She is agreeable to Tdap, understands the cost of this. -     amphetamine-dextroamphetamine (ADDERALL) 10 MG tablet; Take 1 tablet (10 mg total) by mouth daily with breakfast.  Other orders -     losartan-hydrochlorothiazide (HYZAAR) 100-25 MG tablet; Take 1 tablet by mouth daily. -     albuterol (VENTOLIN HFA) 108 (90 Base) MCG/ACT inhaler; Inhale 2 puffs into the lungs every 6 (six) hours as needed for wheezing or shortness of breath.  . Reviewed expectations re: course of current medical issues. . Discussed self-management of symptoms. . Outlined signs and symptoms indicating need for more acute intervention. . Patient verbalized understanding and all questions were answered. . See orders for this visit as documented in the electronic medical record. . Patient received an After Visit Summary.  CMA or LPN  served as scribe during this visit. History, Physical, and Plan performed by medical provider. The above documentation has been reviewed and is accurate and complete.  [name], PA-C [place] [place] 04/08/2020  Follow-up: No follow-ups on file.

## 2020-04-09 ENCOUNTER — Other Ambulatory Visit: Payer: Self-pay | Admitting: Physician Assistant

## 2020-04-09 LAB — CBC WITH DIFFERENTIAL/PLATELET
Absolute Monocytes: 428 cells/uL (ref 200–950)
Basophils Absolute: 53 cells/uL (ref 0–200)
Basophils Relative: 0.7 %
Eosinophils Absolute: 98 cells/uL (ref 15–500)
Eosinophils Relative: 1.3 %
HCT: 38.9 % (ref 35.0–45.0)
Hemoglobin: 13.3 g/dL (ref 11.7–15.5)
Lymphs Abs: 2918 cells/uL (ref 850–3900)
MCH: 31.9 pg (ref 27.0–33.0)
MCHC: 34.2 g/dL (ref 32.0–36.0)
MCV: 93.3 fL (ref 80.0–100.0)
MPV: 9.8 fL (ref 7.5–12.5)
Monocytes Relative: 5.7 %
Neutro Abs: 4005 cells/uL (ref 1500–7800)
Neutrophils Relative %: 53.4 %
Platelets: 280 10*3/uL (ref 140–400)
RBC: 4.17 10*6/uL (ref 3.80–5.10)
RDW: 14.5 % (ref 11.0–15.0)
Total Lymphocyte: 38.9 %
WBC: 7.5 10*3/uL (ref 3.8–10.8)

## 2020-04-09 LAB — COMPREHENSIVE METABOLIC PANEL
AG Ratio: 1.6 (calc) (ref 1.0–2.5)
ALT: 23 U/L (ref 6–29)
AST: 22 U/L (ref 10–35)
Albumin: 4.3 g/dL (ref 3.6–5.1)
Alkaline phosphatase (APISO): 76 U/L (ref 37–153)
BUN: 15 mg/dL (ref 7–25)
CO2: 30 mmol/L (ref 20–32)
Calcium: 9.5 mg/dL (ref 8.6–10.4)
Chloride: 99 mmol/L (ref 98–110)
Creat: 0.95 mg/dL (ref 0.50–0.99)
Globulin: 2.7 g/dL (calc) (ref 1.9–3.7)
Glucose, Bld: 94 mg/dL (ref 65–99)
Potassium: 3.8 mmol/L (ref 3.5–5.3)
Sodium: 137 mmol/L (ref 135–146)
Total Bilirubin: 0.4 mg/dL (ref 0.2–1.2)
Total Protein: 7 g/dL (ref 6.1–8.1)

## 2020-04-09 LAB — LIPID PANEL
Cholesterol: 267 mg/dL — ABNORMAL HIGH (ref <200)
HDL: 45 mg/dL — ABNORMAL LOW (ref >=50)
Non-HDL Cholesterol (Calc): 222 mg/dL (calc) — ABNORMAL HIGH (ref <130)
Total CHOL/HDL Ratio: 5.9 (calc) — ABNORMAL HIGH (ref <5.0)
Triglycerides: 579 mg/dL — ABNORMAL HIGH (ref <150)

## 2020-04-09 MED ORDER — ATORVASTATIN CALCIUM 40 MG PO TABS
40.0000 mg | ORAL_TABLET | Freq: Every day | ORAL | 3 refills | Status: DC
Start: 2020-04-09 — End: 2021-04-06

## 2020-04-14 ENCOUNTER — Telehealth: Payer: Self-pay

## 2020-04-14 NOTE — Telephone Encounter (Signed)
Pt returned call from [facility]

## 2020-04-15 NOTE — Telephone Encounter (Signed)
See result notes. 

## 2020-06-30 ENCOUNTER — Telehealth: Payer: Self-pay

## 2020-06-30 ENCOUNTER — Other Ambulatory Visit: Payer: Self-pay | Admitting: Physician Assistant

## 2020-06-30 MED ORDER — AMPHETAMINE-DEXTROAMPHETAMINE 10 MG PO TABS
10.0000 mg | ORAL_TABLET | Freq: Every day | ORAL | 0 refills | Status: DC
Start: 2020-07-30 — End: 2020-07-17

## 2020-06-30 MED ORDER — AMPHETAMINE-DEXTROAMPHETAMINE 10 MG PO TABS
10.0000 mg | ORAL_TABLET | Freq: Every day | ORAL | 0 refills | Status: DC
Start: 2020-08-29 — End: 2020-07-17

## 2020-06-30 MED ORDER — AMPHETAMINE-DEXTROAMPHETAMINE 10 MG PO TABS
10.0000 mg | ORAL_TABLET | Freq: Every day | ORAL | 0 refills | Status: DC
Start: 2020-06-30 — End: 2020-07-17

## 2020-06-30 NOTE — Telephone Encounter (Signed)
Sent x 3 months

## 2020-06-30 NOTE — Telephone Encounter (Signed)
Pt requesting refill on Adderall 10 mg. Last OV 04/08/2020, Last Rx 06/05/2020.

## 2020-06-30 NOTE — Telephone Encounter (Signed)
MEDICATION: Adderall 10 MG  PHARMACY: [facility] [address]  Comments:   **Let patient know to contact pharmacy at the end of the day to make sure medication is ready. **  ** Please notify patient to allow 48-72 hours to process**  **Encourage patient to contact the pharmacy for refills or they can request refills through [facility]**

## 2020-07-01 NOTE — Telephone Encounter (Signed)
Left message on voicemail to call office.  

## 2020-07-02 NOTE — Telephone Encounter (Signed)
Pt called back yesterday and Carlena Sax gave pt the message Rx's were sent to the pharmacy.

## 2020-07-17 ENCOUNTER — Encounter: Payer: Self-pay | Admitting: Physician Assistant

## 2020-07-17 ENCOUNTER — Ambulatory Visit: Payer: Self-pay | Admitting: Physician Assistant

## 2020-07-17 ENCOUNTER — Other Ambulatory Visit: Payer: Self-pay

## 2020-07-17 VITALS — BP 140/90 | HR 80 | Temp 97.3°F | Ht 64.0 in | Wt 179.2 lb

## 2020-07-17 DIAGNOSIS — Z23 Encounter for immunization: Secondary | ICD-10-CM

## 2020-07-17 DIAGNOSIS — F909 Attention-deficit hyperactivity disorder, unspecified type: Secondary | ICD-10-CM

## 2020-07-17 DIAGNOSIS — Z1211 Encounter for screening for malignant neoplasm of colon: Secondary | ICD-10-CM

## 2020-07-17 DIAGNOSIS — I10 Essential (primary) hypertension: Secondary | ICD-10-CM

## 2020-07-17 MED ORDER — AMPHETAMINE-DEXTROAMPHET ER 20 MG PO CP24
20.0000 mg | ORAL_CAPSULE | ORAL | 0 refills | Status: DC
Start: 2020-07-17 — End: 2020-08-11

## 2020-07-17 NOTE — Patient Instructions (Signed)
It was great to see you!  Let's change you to extended release Adderall 20 mg daily  Send me a mychart message in a week or two to update me on how you like this change  Keep tabs on your blood pressure and let me know if consistently >150/90  You will be contacted about your colon cancer screening  Let's follow-up in 3 months, sooner if you have concerns.  Take care,  [name] PA-C

## 2020-07-17 NOTE — Progress Notes (Signed)
Christina Cowan is a 59 y.o. female is here for follow up.  I acted as a Neurosurgeon for [employer], PA-C Christina Mull, LPN   History of Present Illness:   Chief Complaint  Patient presents with  . ADHD    HPI   ADHD Pt following up today, currently taking Adderall 10 mg. Pt would like to discuss increasing dose due to not lasting all day. Pt is having trouble focusing, concentrating and gets sleepy when medication wears off. Denies concerns for insomnia.  HTN Currently taking Losartan-HCTZ 100-25 mg. At home blood pressure readings are: 130-140/80-85. Patient denies chest pain, SOB, blurred vision, dizziness, unusual headaches, lower leg swelling. Patient is compliant with medication. Denies excessive caffeine intake, stimulant usage, excessive alcohol intake, or increase in salt consumption.  BP Readings from Last 3 Encounters:  07/17/20 140/90  04/08/20 138/82  01/15/20 (!) 144/80   Colon Cancer Screening Was given FOBT cards but never had this done. She is willing to trial cologaurd. Denies rectal bleeding, unintentional weight loss or other GI concerns.  Health Maintenance Due  Topic Date Due  . COLON CANCER SCREENING ANNUAL FOBT  Never done  . PAP SMEAR-Modifier  07/14/2015  . COVID-19 Vaccine (2 - Pfizer 2-dose series) 02/02/2020  . INFLUENZA VACCINE  03/29/2020    Past Medical History:  Diagnosis Date  . Anxiety   . Arthritis   . Asthma   . Essential hypertension, benign 06/17/2014  . GERD (gastroesophageal reflux disease)   . Pure hypercholesterolemia 06/17/2014     Social History   Tobacco Use  . Smoking status: Current Every Day Smoker    Packs/day: 0.40    Years: 16.00    Pack years: 6.40    Types: Cigarettes  . Smokeless tobacco: Never Used  Substance Use Topics  . Alcohol use: Yes    Alcohol/week: 4.0 standard drinks    Types: 4 Glasses of wine per week    Comment: occ  . Drug use: Yes    Types: Hydrocodone    Past Surgical History:   Procedure Laterality Date  . CESAREAN SECTION      Family History  Problem Relation Age of Onset  . Arthritis Mother   . Hypertension Mother   . Arthritis Father   . Colon cancer Paternal Uncle     PMHx, SurgHx, SocialHx, FamHx, Medications, and Allergies were reviewed in the Visit Navigator and updated as appropriate.   Patient Active Problem List   Diagnosis Date Noted  . Mixed hyperlipidemia 08/06/2019  . Essential hypertension, benign 06/17/2014  . Shingles rash 03/18/2014  . GERD 12/16/2009  . Vitamin D deficiency 12/24/2008  . Dysthymic disorder 07/24/2007  . Attention deficit disorder 07/24/2007    Social History   Tobacco Use  . Smoking status: Current Every Day Smoker    Packs/day: 0.40    Years: 16.00    Pack years: 6.40    Types: Cigarettes  . Smokeless tobacco: Never Used  Substance Use Topics  . Alcohol use: Yes    Alcohol/week: 4.0 standard drinks    Types: 4 Glasses of wine per week    Comment: occ  . Drug use: Yes    Types: Hydrocodone    Current Medications and Allergies:    Current Outpatient Medications:  .  albuterol (VENTOLIN HFA) 108 (90 Base) MCG/ACT inhaler, Inhale 2 puffs into the lungs every 6 (six) hours as needed for wheezing or shortness of breath., Disp: 18 g, Rfl: 1 .  atorvastatin (  LIPITOR) 40 MG tablet, Take 1 tablet (40 mg total) by mouth daily., Disp: 90 tablet, Rfl: 3 .  fexofenadine (ALLEGRA) 180 MG tablet, Take 180 mg by mouth daily., Disp: , Rfl:  .  fluticasone (FLONASE) 50 MCG/ACT nasal spray, Place 1 spray into both nostrils daily., Disp: , Rfl:  .  losartan-hydrochlorothiazide (HYZAAR) 100-25 MG tablet, Take 1 tablet by mouth daily., Disp: 90 tablet, Rfl: 3 .  amphetamine-dextroamphetamine (ADDERALL XR) 20 MG 24 hr capsule, Take 1 capsule (20 mg total) by mouth every morning., Disp: 30 capsule, Rfl: 0 .  venlafaxine XR (EFFEXOR-XR) 150 MG 24 hr capsule, Take 2 capsules (300 mg total) by mouth daily with breakfast.,  Disp: 180 capsule, Rfl: 2   Allergies  Allergen Reactions  . Prednisone Other (See Comments)    Bad thoughts -- only with oral prednisone, can tolerate injection per patient 01/15/20    Review of Systems   ROS  Negative unless otherwise specified per HPI.  Vitals:   Vitals:   07/17/20 0841  BP: 140/90  Pulse: 80  Temp: (!) 97.3 F (36.3 C)  TempSrc: Temporal  SpO2: 95%  Weight: 179 lb 4 oz (81.3 kg)  Height: 5\' 4"  (1.626 m)     Body mass index is 30.77 kg/m.   Physical Exam:    Physical Exam Vitals and nursing note reviewed.  Constitutional:      General: She is not in acute distress.    Appearance: She is well-developed. She is not ill-appearing or toxic-appearing.  Cardiovascular:     Rate and Rhythm: Normal rate and regular rhythm.     Pulses: Normal pulses.     Heart sounds: Normal heart sounds, S1 normal and S2 normal.     Comments: No LE edema Pulmonary:     Effort: Pulmonary effort is normal.     Breath sounds: Normal breath sounds.  Skin:    General: Skin is warm and dry.  Neurological:     Mental Status: She is alert.     GCS: GCS eye subscore is 4. GCS verbal subscore is 5. GCS motor subscore is 6.  Psychiatric:        Speech: Speech normal.        Behavior: Behavior normal. Behavior is cooperative.      Assessment and Plan:    Christina Cowan was seen today for adhd.  Diagnoses and all orders for this visit:  Attention deficit hyperactivity disorder (ADHD), unspecified ADHD type Uncontrolled. Stop Adderall 10 mg immediate release, start 20 mg adderall XR. Instructed her to send Christina Cowan mychart message in 1-2 weeks to let us know how she is doing with this change.  Special screening for malignant neoplasms, colon -     Cologuard  Essential hypertension, benign Controlled. Continue Losartan-HCTZ 100-25 mg daily. Follow-up in 3 months sooner if concerns. Recommend close monitoring of BP at home while we increase adderall dosage.  Other orders -      amphetamine-dextroamphetamine (ADDERALL XR) 20 MG 24 hr capsule; Take 1 capsule (20 mg total) by mouth every morning.    CMA or LPN served as scribe during this visit. History, Physical, and Plan performed by medical provider. The above documentation has been reviewed and is accurate and complete.   [place], PA-C [place] 07/17/2020  Follow-up: No follow-ups on file.

## 2020-08-11 ENCOUNTER — Other Ambulatory Visit: Payer: Self-pay | Admitting: Physician Assistant

## 2020-08-11 ENCOUNTER — Encounter: Payer: Self-pay | Admitting: Physician Assistant

## 2020-08-11 MED ORDER — AMPHETAMINE-DEXTROAMPHET ER 20 MG PO CP24: 20.0000 mg | ORAL_CAPSULE | ORAL | 0 refills | Status: DC

## 2020-08-11 MED ORDER — AMPHETAMINE-DEXTROAMPHET ER 20 MG PO CP24
20.0000 mg | ORAL_CAPSULE | ORAL | 0 refills | Status: DC
Start: 2020-08-11 — End: 2020-11-09

## 2020-08-18 ENCOUNTER — Other Ambulatory Visit: Payer: Self-pay

## 2020-08-18 ENCOUNTER — Ambulatory Visit (INDEPENDENT_AMBULATORY_CARE_PROVIDER_SITE_OTHER): Payer: Self-pay | Admitting: Family Medicine

## 2020-08-18 ENCOUNTER — Encounter: Payer: Self-pay | Admitting: Family Medicine

## 2020-08-18 VITALS — BP 147/79 | HR 90 | Temp 97.9°F | Ht 64.0 in | Wt 177.6 lb

## 2020-08-18 DIAGNOSIS — M1990 Unspecified osteoarthritis, unspecified site: Secondary | ICD-10-CM

## 2020-08-18 MED ORDER — METHYLPREDNISOLONE ACETATE 80 MG/ML IJ SUSP
80.0000 mg | Freq: Once | INTRAMUSCULAR | Status: AC
  Administered 2020-08-18: 80 mg via INTRA_ARTICULAR

## 2020-08-18 NOTE — Patient Instructions (Signed)
It was very nice to see you today!  We injected your knee with cortisone today.  You may notice more pressure over the next day or so after that you should have improvement in your symptoms.  Please let us know if you have any major redness or swelling to the area.  Please use compression as you have been doing.  Take care, Dr Jimmey Ralph  Please try these tips to maintain a healthy lifestyle:   Eat at least 3 REAL meals and 1-2 snacks per day.  Aim for no more than 5 hours between eating.  If you eat breakfast, please do so within one hour of getting up.    Each meal should contain half fruits/vegetables, one quarter protein, and one quarter carbs (no bigger than a computer mouse)   Cut down on sweet beverages. This includes juice, soda, and sweet tea.     Drink at least 1 glass of water with each meal and aim for at least 8 glasses per day   Exercise at least 150 minutes every week.

## 2020-08-18 NOTE — Progress Notes (Signed)
   Christina Cowan is a 59 y.o. female who presents today for an office visit.  Assessment/Plan:  Chronic Problems Addressed Today: Knee pain Worsened recently.  No red flags.  Injection performed today-see below procedure note.  She tolerated well.  Recommended continuing compression to the area.  Discussed reasons to return to care.    Subjective:  HPI:  Patient here for flare of chronic left knee pain.  Has a longstanding history of osteoarthritis.  Worsened within the last week or so.  Thinks that she "overdid it".  No obvious injuries or precipitating events.  She has tried heating pad which is helped.  Ice pack made worse.  She has noticed more swelling to the area.        Objective:  Physical Exam: BP (!) 147/79   Pulse 90   Temp 97.9 F (36.6 C) (Temporal)   Ht 5\' 4"  (1.626 m)   Wt 177 lb 9.6 oz (80.6 kg)   LMP 01/06/2011   SpO2 97%   BMI 30.48 kg/m   Gen: No acute distress, resting comfortably MSK: Left knee with mild effusion.  Neurovascular intact distally.  Crepitus with active and passive range of motion.  Patella ballotable. Neuro: Grossly normal, moves all extremities Psych: Normal affect and thought content  Knee Arthrocentesis with Injection Procedure Note  Pre-operative Diagnosis: left Knee Pain  Post-operative Diagnosis: same  Indications: Symptomatic relief of large effusion  Anesthesia: Topical Ethyl Chloride Procedure Details   Verbal consent was obtained for the procedure. The joint was prepped with Betadine and a small wheel of anesthetic was injected into the subcutaneous tissue. A 22 gauge needle was inserted into the superior aspect of the joint from a lateral approach. 1 ml of clear yellow fluid was removed from the joint and discarded. 3 ml 1% lidocaine and 80 ml of depo medrol was then injected into the joint through the same needle. The needle was removed and the area cleansed and dressed.  Complications:  None; patient tolerated the procedure  well.      03/08/2011. Katina Degree, MD 08/18/2020 3:54 PM

## 2020-11-09 ENCOUNTER — Telehealth: Payer: Self-pay

## 2020-11-09 ENCOUNTER — Other Ambulatory Visit: Payer: Self-pay | Admitting: Physician Assistant

## 2020-11-09 MED ORDER — AMPHETAMINE-DEXTROAMPHET ER 20 MG PO CP24: 20.0000 mg | ORAL_CAPSULE | ORAL | 0 refills | Status: DC

## 2020-11-09 NOTE — Telephone Encounter (Signed)
Pt requesting refill for Adderall. Last OV 07/2020.

## 2020-11-09 NOTE — Telephone Encounter (Signed)
Refill x 1 month sent in. Please encourage 1 month follow-up to have visit with me and BP checked prior to my leave.

## 2020-11-09 NOTE — Telephone Encounter (Signed)
..   LAST APPOINTMENT DATE: 08/18/2020   NEXT APPOINTMENT DATE:@Visit  date not found  MEDICATION: amphetamine-dextroamphetamine (ADDERALL XR) 20 MG 24 hr capsule

## 2020-12-10 ENCOUNTER — Other Ambulatory Visit: Payer: Self-pay | Admitting: Physician Assistant

## 2020-12-10 MED ORDER — AMPHETAMINE-DEXTROAMPHET ER 20 MG PO CP24: 20.0000 mg | ORAL_CAPSULE | ORAL | 0 refills | Status: DC

## 2020-12-10 NOTE — Telephone Encounter (Signed)
RX request.

## 2020-12-10 NOTE — Telephone Encounter (Signed)
Rx request 

## 2020-12-10 NOTE — Telephone Encounter (Signed)
No further fills until sees [place] in office.

## 2021-01-07 ENCOUNTER — Other Ambulatory Visit: Payer: Self-pay | Admitting: Physician Assistant

## 2021-01-08 MED ORDER — AMPHETAMINE-DEXTROAMPHET ER 20 MG PO CP24: 20.0000 mg | ORAL_CAPSULE | ORAL | 0 refills | Status: DC

## 2021-01-08 NOTE — Telephone Encounter (Signed)
Last Refill 12/10/2020 Last OV 08/18/2020 dx Osteoarthritis

## 2021-02-04 ENCOUNTER — Other Ambulatory Visit: Payer: Self-pay | Admitting: Family Medicine

## 2021-02-04 ENCOUNTER — Other Ambulatory Visit: Payer: Self-pay | Admitting: Physician Assistant

## 2021-02-04 DIAGNOSIS — F341 Dysthymic disorder: Secondary | ICD-10-CM

## 2021-02-04 MED ORDER — AMPHETAMINE-DEXTROAMPHET ER 20 MG PO CP24: 20.0000 mg | ORAL_CAPSULE | ORAL | 0 refills | Status: DC

## 2021-03-05 ENCOUNTER — Other Ambulatory Visit: Payer: Self-pay | Admitting: Family Medicine

## 2021-03-05 MED ORDER — AMPHETAMINE-DEXTROAMPHET ER 20 MG PO CP24: 20.0000 mg | ORAL_CAPSULE | ORAL | 0 refills | Status: DC

## 2021-03-26 ENCOUNTER — Encounter: Payer: Self-pay | Admitting: Family Medicine

## 2021-03-26 NOTE — Telephone Encounter (Signed)
See note

## 2021-04-06 ENCOUNTER — Ambulatory Visit (INDEPENDENT_AMBULATORY_CARE_PROVIDER_SITE_OTHER): Payer: Self-pay | Admitting: Physician Assistant

## 2021-04-06 ENCOUNTER — Encounter: Payer: Self-pay | Admitting: Physician Assistant

## 2021-04-06 ENCOUNTER — Other Ambulatory Visit: Payer: Self-pay

## 2021-04-06 VITALS — BP 140/78 | HR 95 | Temp 97.9°F | Ht 64.0 in | Wt 172.2 lb

## 2021-04-06 DIAGNOSIS — Z1211 Encounter for screening for malignant neoplasm of colon: Secondary | ICD-10-CM

## 2021-04-06 DIAGNOSIS — F341 Dysthymic disorder: Secondary | ICD-10-CM

## 2021-04-06 DIAGNOSIS — H9193 Unspecified hearing loss, bilateral: Secondary | ICD-10-CM

## 2021-04-06 DIAGNOSIS — E782 Mixed hyperlipidemia: Secondary | ICD-10-CM

## 2021-04-06 DIAGNOSIS — F909 Attention-deficit hyperactivity disorder, unspecified type: Secondary | ICD-10-CM

## 2021-04-06 LAB — COMPREHENSIVE METABOLIC PANEL
ALT: 19 U/L (ref 0–35)
AST: 19 U/L (ref 0–37)
Albumin: 4.4 g/dL (ref 3.5–5.2)
Alkaline Phosphatase: 81 U/L (ref 39–117)
BUN: 15 mg/dL (ref 6–23)
CO2: 30 mEq/L (ref 19–32)
Calcium: 9.8 mg/dL (ref 8.4–10.5)
Chloride: 96 mEq/L (ref 96–112)
Creatinine, Ser: 1.09 mg/dL (ref 0.40–1.20)
GFR: 54.85 mL/min — ABNORMAL LOW (ref >60.00)
Glucose, Bld: 85 mg/dL (ref 70–99)
Potassium: 2.9 mEq/L — ABNORMAL LOW (ref 3.5–5.1)
Sodium: 136 mEq/L (ref 135–145)
Total Bilirubin: 0.4 mg/dL (ref 0.2–1.2)
Total Protein: 7.5 g/dL (ref 6.0–8.3)

## 2021-04-06 LAB — CBC WITH DIFFERENTIAL/PLATELET
Basophils Absolute: 0 10*3/uL (ref 0.0–0.1)
Basophils Relative: 0.4 % (ref 0.0–3.0)
Eosinophils Absolute: 0 10*3/uL (ref 0.0–0.7)
Eosinophils Relative: 0.5 % (ref 0.0–5.0)
HCT: 39.6 % (ref 36.0–46.0)
Hemoglobin: 13.6 g/dL (ref 12.0–15.0)
Lymphocytes Relative: 25.6 % (ref 12.0–46.0)
Lymphs Abs: 2.1 10*3/uL (ref 0.7–4.0)
MCHC: 34.5 g/dL (ref 30.0–36.0)
MCV: 90.9 fl (ref 78.0–100.0)
Monocytes Absolute: 0.5 10*3/uL (ref 0.1–1.0)
Monocytes Relative: 6 % (ref 3.0–12.0)
Neutro Abs: 5.6 10*3/uL (ref 1.4–7.7)
Neutrophils Relative %: 67.5 % (ref 43.0–77.0)
Platelets: 275 10*3/uL (ref 150.0–400.0)
RBC: 4.35 Mil/uL (ref 3.87–5.11)
RDW: 14.1 % (ref 11.5–15.5)
WBC: 8.4 10*3/uL (ref 4.0–10.5)

## 2021-04-06 LAB — LIPID PANEL
Cholesterol: 243 mg/dL — ABNORMAL HIGH (ref 0–200)
HDL: 44.5 mg/dL (ref >39.00)
Total CHOL/HDL Ratio: 5
Triglycerides: 579 mg/dL — ABNORMAL HIGH (ref 0.0–149.0)

## 2021-04-06 LAB — LDL CHOLESTEROL, DIRECT: Direct LDL: 125 mg/dL

## 2021-04-06 MED ORDER — AMPHETAMINE-DEXTROAMPHETAMINE 20 MG PO TABS: 20.0000 mg | ORAL_TABLET | Freq: Every day | ORAL | 0 refills | Status: DC

## 2021-04-06 MED ORDER — AMPHETAMINE-DEXTROAMPHETAMINE 10 MG PO TABS: 10.0000 mg | ORAL_TABLET | Freq: Every day | ORAL | 0 refills | Status: DC

## 2021-04-06 MED ORDER — VENLAFAXINE HCL ER 150 MG PO CP24: ORAL_CAPSULE | ORAL | 1 refills | Status: DC

## 2021-04-06 MED ORDER — ATORVASTATIN CALCIUM 40 MG PO TABS: 40.0000 mg | ORAL_TABLET | Freq: Every day | ORAL | 3 refills | Status: DC

## 2021-04-06 MED ORDER — LOSARTAN POTASSIUM-HCTZ 100-25 MG PO TABS: 1.0000 | ORAL_TABLET | Freq: Every day | ORAL | 3 refills | Status: DC

## 2021-04-06 NOTE — Progress Notes (Signed)
Christina Cowan is a 60 y.o. female is here for medication follow up.  I acted as a Neurosurgeon for [employer], PA-C Corky Mull, LPN   History of Present Illness:   Chief Complaint  Patient presents with   ADD    HPI  Controlled substance: Adderall XR 20 mg [facility] Status : Last Rx filled7/06/2021, # 30, 0 refills New Findings : N/A  ADD Currently taking Adderall XR 20 mg. Pt would like to discuss changing to Adderall 20 mg BID tablets. Denies, insomnia, decrease appetite. She feels like the extended release is not as good of fit for has the Adderall immediate release. Denies trouble sleeping or issues with irritability/anger.  Wt Readings from Last 4 Encounters:  04/06/21 172 lb 4 oz (78.1 kg)  08/18/20 177 lb 9.6 oz (80.6 kg)  07/17/20 179 lb 4 oz (81.3 kg)  04/08/20 175 lb 9.6 oz (79.7 kg)   Colon cancer screening Due for colorectal screening, agreeable to cologaurd.  Dysthymia Doing well with effexor 300 mg xr daily. Does have some situational stress with her son currently. She is interested in talk therapy, has done therapy in the past with good results. Denies SI/HI.  HLD Continues on lipitor 40 mg daily. Tolerating well. Denies concerns.  HTN Currently taking losartan-hctz 100-25 mg. At home blood pressure readings are: normal per patient. Patient denies chest pain, SOB, blurred vision, dizziness, unusual headaches, lower leg swelling. Patient is compliant with medication. Denies excessive caffeine intake, stimulant usage, excessive alcohol intake, or increase in salt consumption.  Decreased hearing Would like to consider getting hearing aids. Feels like hearing is worsening, especially on the L.  Health Maintenance Due  Topic Date Due   Pneumococcal Vaccine 13-43 Years old (1 - PCV) Never done   Hepatitis C Screening  Never done   MAMMOGRAM  10/19/2009   Fecal DNA (Cologuard)  Never done   Zoster Vaccines- Shingrix (1 of 2) Never done   PAP  SMEAR-Modifier  07/14/2015   COVID-19 Vaccine (2 - Pfizer series) 02/02/2020   INFLUENZA VACCINE  03/29/2021    Past Medical History:  Diagnosis Date   Anxiety    Arthritis    Asthma    Essential hypertension, benign 06/17/2014   GERD (gastroesophageal reflux disease)    Pure hypercholesterolemia 06/17/2014     Social History   Tobacco Use   Smoking status: Every Day    Packs/day: 0.40    Years: 16.00    Pack years: 6.40    Types: Cigarettes   Smokeless tobacco: Never  Substance Use Topics   Alcohol use: Yes    Alcohol/week: 4.0 standard drinks    Types: 4 Glasses of wine per week    Comment: occ   Drug use: Yes    Types: Hydrocodone    Past Surgical History:  Procedure Laterality Date   CESAREAN SECTION      Family History  Problem Relation Age of Onset   Arthritis Mother    Hypertension Mother    Arthritis Father    Colon cancer Paternal Uncle     PMHx, SurgHx, SocialHx, FamHx, Medications, and Allergies were reviewed in the Visit Navigator and updated as appropriate.   Patient Active Problem List   Diagnosis Date Noted   Mixed hyperlipidemia 08/06/2019   Essential hypertension, benign 06/17/2014   Shingles rash 03/18/2014   GERD 12/16/2009   Vitamin D deficiency 12/24/2008   Dysthymic disorder 07/24/2007   Attention deficit disorder 07/24/2007  Social History   Tobacco Use   Smoking status: Every Day    Packs/day: 0.40    Years: 16.00    Pack years: 6.40    Types: Cigarettes   Smokeless tobacco: Never  Substance Use Topics   Alcohol use: Yes    Alcohol/week: 4.0 standard drinks    Types: 4 Glasses of wine per week    Comment: occ   Drug use: Yes    Types: Hydrocodone    Current Medications and Allergies:    Current Outpatient Medications:    albuterol (VENTOLIN HFA) 108 (90 Base) MCG/ACT inhaler, Inhale 2 puffs into the lungs every 6 (six) hours as needed for wheezing or shortness of breath., Disp: 18 g, Rfl: 1    amphetamine-dextroamphetamine (ADDERALL) 10 MG tablet, Take 1 tablet (10 mg total) by mouth daily with breakfast., Disp: 30 tablet, Rfl: 0   [START ON 05/06/2021] amphetamine-dextroamphetamine (ADDERALL) 10 MG tablet, Take 1 tablet (10 mg total) by mouth daily with breakfast., Disp: 30 tablet, Rfl: 0   [START ON 06/05/2021] amphetamine-dextroamphetamine (ADDERALL) 10 MG tablet, Take 1 tablet (10 mg total) by mouth daily with breakfast., Disp: 30 tablet, Rfl: 0   amphetamine-dextroamphetamine (ADDERALL) 20 MG tablet, Take 1 tablet (20 mg total) by mouth daily before breakfast., Disp: 30 tablet, Rfl: 0   [START ON 05/06/2021] amphetamine-dextroamphetamine (ADDERALL) 20 MG tablet, Take 1 tablet (20 mg total) by mouth daily before breakfast., Disp: 30 tablet, Rfl: 0   [START ON 06/05/2021] amphetamine-dextroamphetamine (ADDERALL) 20 MG tablet, Take 1 tablet (20 mg total) by mouth daily before breakfast., Disp: 30 tablet, Rfl: 0   fexofenadine (ALLEGRA) 180 MG tablet, Take 180 mg by mouth daily., Disp: , Rfl:    fluticasone (FLONASE) 50 MCG/ACT nasal spray, Place 1 spray into both nostrils daily., Disp: , Rfl:    atorvastatin (LIPITOR) 40 MG tablet, Take 1 tablet (40 mg total) by mouth daily., Disp: 90 tablet, Rfl: 3   losartan-hydrochlorothiazide (HYZAAR) 100-25 MG tablet, Take 1 tablet by mouth daily., Disp: 90 tablet, Rfl: 3   venlafaxine XR (EFFEXOR-XR) 150 MG 24 hr capsule, TAKE TWO CAPSULES BY MOUTH EVERY MORNING WITH BREAKFAST, Disp: 180 capsule, Rfl: 1   Allergies  Allergen Reactions   Prednisone Other (See Comments)    Bad thoughts -- only with oral prednisone, can tolerate injection per patient 01/15/20    Review of Systems   ROS Negative unless otherwise specified per HPI.  Vitals:   Vitals:   04/06/21 1312  BP: 140/78  Pulse: 95  Temp: 97.9 F (36.6 C)  TempSrc: Temporal  SpO2: 96%  Weight: 172 lb 4 oz (78.1 kg)  Height: 5\' 4"  (1.626 m)     Body mass index is 29.57  kg/m.   Physical Exam:    Physical Exam Vitals and nursing note reviewed.  Constitutional:      General: She is not in acute distress.    Appearance: She is well-developed. She is not ill-appearing or toxic-appearing.  Cardiovascular:     Rate and Rhythm: Normal rate and regular rhythm.     Pulses: Normal pulses.     Heart sounds: Normal heart sounds, S1 normal and S2 normal.     Comments: No LE edema Pulmonary:     Effort: Pulmonary effort is normal.     Breath sounds: Normal breath sounds.  Skin:    General: Skin is warm and dry.  Neurological:     Mental Status: She is alert.  GCS: GCS eye subscore is 4. GCS verbal subscore is 5. GCS motor subscore is 6.  Psychiatric:        Speech: Speech normal.        Behavior: Behavior normal. Behavior is cooperative.     Assessment and Plan:    Christina Cowan was seen today for add.  Diagnoses and all orders for this visit:  Attention deficit hyperactivity disorder (ADHD), unspecified ADHD type PDMP reviewed, no red flags Will stop Adderall 20 mg XR Start Adderall 20 mg in AM and Adderall 20 mg in PM Follow-up in 6 months, sooner if concerns  Screening for colon cancer -     Cologuard  Dysthymic disorder Well controlled but would benefit from talk therapy Continue medications as prescribed below Follow-up in 6 months, sooner if concerns -     venlafaxine XR (EFFEXOR-XR) 150 MG 24 hr capsule; TAKE TWO CAPSULES BY MOUTH EVERY MORNING WITH BREAKFAST -     Comprehensive metabolic panel -     CBC with Differential/Platelet -     Ambulatory referral to Psychology  Mixed hyperlipidemia Update lipid panel and adjust lipitor 40 mg as indicated -     Lipid panel  Bilateral hearing loss, unspecified hearing loss type -     Ambulatory referral to Audiology  Other orders -     losartan-hydrochlorothiazide (HYZAAR) 100-25 MG tablet; Take 1 tablet by mouth daily. -     atorvastatin (LIPITOR) 40 MG tablet; Take 1 tablet (40 mg total)  by mouth daily. -     amphetamine-dextroamphetamine (ADDERALL) 20 MG tablet; Take 1 tablet (20 mg total) by mouth daily before breakfast. -     amphetamine-dextroamphetamine (ADDERALL) 20 MG tablet; Take 1 tablet (20 mg total) by mouth daily before breakfast. -     amphetamine-dextroamphetamine (ADDERALL) 20 MG tablet; Take 1 tablet (20 mg total) by mouth daily before breakfast. -     amphetamine-dextroamphetamine (ADDERALL) 10 MG tablet; Take 1 tablet (10 mg total) by mouth daily with breakfast. -     amphetamine-dextroamphetamine (ADDERALL) 10 MG tablet; Take 1 tablet (10 mg total) by mouth daily with breakfast. -     amphetamine-dextroamphetamine (ADDERALL) 10 MG tablet; Take 1 tablet (10 mg total) by mouth daily with breakfast.  CMA or LPN served as scribe during this visit. History, Physical, and Plan performed by medical provider. The above documentation has been reviewed and is accurate and complete.   [name], PA-C [place] 04/06/2021  Follow-up: Return in about 6 months (around 10/07/2021) for Medication F/U.

## 2021-04-06 NOTE — Patient Instructions (Signed)
It was great to see you!  Referral for Audiology and Therapy placed today  Cologuard should be contacting you  Refills sent  Labs today -- we will be in touch  Let's follow-up in 6 months, sooner if you have concerns.  If a referral was placed today, you will be contacted for an appointment. Please note that routine referrals can sometimes take up to 3-4 weeks to process. Please call our office if you haven't heard anything after this time frame.  Take care,  [name] PA-C

## 2021-04-07 ENCOUNTER — Other Ambulatory Visit: Payer: Self-pay | Admitting: Physician Assistant

## 2021-04-07 MED ORDER — ATORVASTATIN CALCIUM 80 MG PO TABS: 80.0000 mg | ORAL_TABLET | Freq: Every day | ORAL | 3 refills | Status: DC

## 2021-04-07 MED ORDER — LOSARTAN POTASSIUM 100 MG PO TABS: 100.0000 mg | ORAL_TABLET | Freq: Every day | ORAL | 1 refills | Status: DC

## 2021-05-04 ENCOUNTER — Other Ambulatory Visit: Payer: Self-pay | Admitting: Physician Assistant

## 2021-05-18 ENCOUNTER — Encounter: Payer: Self-pay | Admitting: Physician Assistant

## 2021-05-19 ENCOUNTER — Other Ambulatory Visit: Payer: Self-pay | Admitting: Physician Assistant

## 2021-05-19 DIAGNOSIS — E876 Hypokalemia: Secondary | ICD-10-CM

## 2021-06-22 ENCOUNTER — Encounter: Payer: Self-pay | Admitting: Physician Assistant

## 2021-06-22 ENCOUNTER — Other Ambulatory Visit: Payer: Self-pay | Admitting: Physician Assistant

## 2021-06-22 MED ORDER — AMPHETAMINE-DEXTROAMPHET ER 30 MG PO CP24: 30.0000 mg | ORAL_CAPSULE | ORAL | 0 refills | Status: DC

## 2021-07-05 ENCOUNTER — Other Ambulatory Visit: Payer: Self-pay | Admitting: Physician Assistant

## 2021-07-19 ENCOUNTER — Telehealth: Payer: Self-pay

## 2021-07-19 DIAGNOSIS — Z006 Encounter for examination for normal comparison and control in clinical research program: Secondary | ICD-10-CM

## 2021-07-19 NOTE — Telephone Encounter (Signed)
I called to speak with pt about participating in our CORE trail. Pt did not answer. I left a message with my name, contact information and the nature of my call.  

## 2021-07-20 ENCOUNTER — Other Ambulatory Visit: Payer: Self-pay | Admitting: Physician Assistant

## 2021-07-20 MED ORDER — AMPHETAMINE-DEXTROAMPHET ER 30 MG PO CP24: 30.0000 mg | ORAL_CAPSULE | ORAL | 0 refills | Status: DC

## 2021-08-20 ENCOUNTER — Other Ambulatory Visit: Payer: Self-pay | Admitting: Physician Assistant

## 2021-08-24 MED ORDER — AMPHETAMINE-DEXTROAMPHET ER 30 MG PO CP24: 30.0000 mg | ORAL_CAPSULE | ORAL | 0 refills | Status: DC

## 2021-08-26 ENCOUNTER — Telehealth: Payer: Self-pay | Admitting: *Deleted

## 2021-08-26 NOTE — Telephone Encounter (Signed)
Emailed pt copy of  Core consent. Message left for pt to call be back with any questions.

## 2021-09-06 ENCOUNTER — Other Ambulatory Visit: Payer: Self-pay | Admitting: Physician Assistant

## 2021-09-08 ENCOUNTER — Telehealth: Payer: Self-pay | Admitting: *Deleted

## 2021-09-08 NOTE — Telephone Encounter (Signed)
Message left for pt to call back with any questions related to Core research.

## 2021-09-21 ENCOUNTER — Encounter: Payer: Self-pay | Admitting: Physician Assistant

## 2021-09-21 ENCOUNTER — Other Ambulatory Visit: Payer: Self-pay | Admitting: Physician Assistant

## 2021-09-22 MED ORDER — AMPHETAMINE-DEXTROAMPHET ER 30 MG PO CP24: 30.0000 mg | ORAL_CAPSULE | ORAL | 0 refills | Status: DC

## 2021-09-22 MED ORDER — ALBUTEROL SULFATE HFA 108 (90 BASE) MCG/ACT IN AERS: 2.0000 | INHALATION_SPRAY | Freq: Four times a day (QID) | RESPIRATORY_TRACT | 1 refills | Status: DC | PRN

## 2021-09-22 NOTE — Telephone Encounter (Signed)
Please see MyChart message from patient 

## 2021-09-22 NOTE — Telephone Encounter (Signed)
30 day supply sent Needs appointment for further medications

## 2021-09-24 NOTE — Telephone Encounter (Signed)
My chart message sent to pt.

## 2021-09-27 ENCOUNTER — Encounter: Payer: Self-pay | Admitting: Physician Assistant

## 2021-09-28 ENCOUNTER — Other Ambulatory Visit: Payer: Self-pay | Admitting: Physician Assistant

## 2021-09-28 MED ORDER — AMPHETAMINE-DEXTROAMPHET ER 25 MG PO CP24: 25.0000 mg | ORAL_CAPSULE | ORAL | 0 refills | Status: DC

## 2021-10-13 ENCOUNTER — Ambulatory Visit: Payer: Self-pay | Admitting: Family Medicine

## 2021-11-02 ENCOUNTER — Ambulatory Visit: Payer: Self-pay | Admitting: Family Medicine

## 2021-11-02 ENCOUNTER — Other Ambulatory Visit: Payer: Self-pay

## 2021-11-02 NOTE — Progress Notes (Incomplete)
? ?  Ghada Abbett is a 61 y.o. female who presents today for an office visit. ? ?Assessment/Plan:  ?New/Acute Problems: ?*** ? ?Chronic Problems Addressed Today: ?No problem-specific Assessment & Plan notes found for this encounter. ? ? ?  ?Subjective:  ?HPI: ? ?Patient here for knee pain follow up.   ? ?   ?  ?Objective:  ?Physical Exam: ?LMP 01/06/2011   ?Gen: No acute distress, resting comfortably*** ?CV: Regular rate and rhythm with no murmurs appreciated ?Pulm: Normal work of breathing, clear to auscultation bilaterally with no crackles, wheezes, or rhonchi ?Neuro: Grossly normal, moves all extremities ?Psych: Normal affect and thought content ? ?   ? ? ?I,Savera Zaman,acting as a Neurosurgeon for Jacquiline Doe, MD.,have documented all relevant documentation on the behalf of Jacquiline Doe, MD,as directed by  Jacquiline Doe, MD while in the presence of Jacquiline Doe, MD.  ? ?*** ?Katina Degree. Jimmey Ralph, MD ?11/02/2021 10:24 AM  ? ?

## 2021-11-03 ENCOUNTER — Encounter: Payer: Self-pay | Admitting: Family Medicine

## 2021-11-03 ENCOUNTER — Other Ambulatory Visit (HOSPITAL_BASED_OUTPATIENT_CLINIC_OR_DEPARTMENT_OTHER): Payer: Self-pay

## 2021-11-03 ENCOUNTER — Ambulatory Visit (INDEPENDENT_AMBULATORY_CARE_PROVIDER_SITE_OTHER): Payer: Self-pay | Admitting: Family Medicine

## 2021-11-03 VITALS — BP 148/88 | HR 90 | Temp 98.0°F | Ht 64.0 in | Wt 177.0 lb

## 2021-11-03 DIAGNOSIS — I10 Essential (primary) hypertension: Secondary | ICD-10-CM

## 2021-11-03 DIAGNOSIS — F341 Dysthymic disorder: Secondary | ICD-10-CM

## 2021-11-03 DIAGNOSIS — F909 Attention-deficit hyperactivity disorder, unspecified type: Secondary | ICD-10-CM

## 2021-11-03 DIAGNOSIS — M25562 Pain in left knee: Secondary | ICD-10-CM

## 2021-11-03 MED ORDER — METHYLPREDNISOLONE ACETATE 80 MG/ML IJ SUSP
80.0000 mg | Freq: Once | INTRAMUSCULAR | Status: AC
  Administered 2021-11-03: 80 mg via INTRAMUSCULAR

## 2021-11-03 MED ORDER — LOSARTAN POTASSIUM 100 MG PO TABS: 100.0000 mg | ORAL_TABLET | Freq: Every day | ORAL | 1 refills | Status: DC

## 2021-11-03 MED ORDER — VENLAFAXINE HCL ER 150 MG PO CP24: ORAL_CAPSULE | ORAL | 1 refills | Status: DC

## 2021-11-03 MED ORDER — AMPHETAMINE-DEXTROAMPHET ER 30 MG PO CP24
30.0000 mg | ORAL_CAPSULE | ORAL | 0 refills | Status: DC
  Filled 2021-11-03: qty 30, 30d supply, fill #0

## 2021-11-03 NOTE — Assessment & Plan Note (Signed)
Database without any red flags.  Will refill her Adderall today.  Medications help with ability to stay focused and on task. ?

## 2021-11-03 NOTE — Patient Instructions (Signed)
It was very nice to see you today! ? ?We took fluid out of your knee and inject steroid today.  We can repeat this again in 3 months if needed. ? ?I will refill your medications today.  Please follow-up with your PCP in a few months. ? ?Take care, ?Dr Jimmey Ralph ? ?PLEASE NOTE: ? ?If you had any lab tests please let us know if you have not heard back within a few days. You may see your results on mychart before we have a chance to review them but we will give you a call once they are reviewed by [place]. If we ordered any referrals today, please let us know if you have not heard from their office within the next week.  ? ?Please try these tips to maintain a healthy lifestyle: ? ?Eat at least 3 REAL meals and 1-2 snacks per day.  Aim for no more than 5 hours between eating.  If you eat breakfast, please do so within one hour of getting up.  ? ?Each meal should contain half fruits/vegetables, one quarter protein, and one quarter carbs (no bigger than a computer mouse) ? ?Cut down on sweet beverages. This includes juice, soda, and sweet tea.  ? ?Drink at least 1 glass of water with each meal and aim for at least 8 glasses per day ? ?Exercise at least 150 minutes every week.   ?

## 2021-11-03 NOTE — Assessment & Plan Note (Signed)
Slightly above goal though typically well controlled.  She may have an element of whitecoat hypertension.  She will continue losartan 100 mg daily.  She will let us know if persistently elevated at home. ?

## 2021-11-03 NOTE — Progress Notes (Signed)
? ?  Christina Cowan is a 61 y.o. female who presents today for an office visit. ? ?Assessment/Plan:  ?New/Acute Problems: ?Left Knee Pain ?Has followed with orthopedics.  She requested repeat steroid injection today.  We discussed risk and benefits of procedure including possible deterioration of cartilage and progression of underlying osteoarthritis.  She was agreeable and wished to proceed with intra-articular injection.  We performed aspiration and injection as below.  She did well with this procedure.  We discussed ice and compression.  She can also use over-the-counter meds as needed.  We can repeat in 3 months if needed though she will need definitive treatment via orthopedics at some point in the near future. ? ?Chronic Problems Addressed Today: ?Attention deficit disorder ?Database without any red flags.  Will refill her Adderall today.  Medications help with ability to stay focused and on task. ? ?Dysthymic Disorder ?Doing well on Effexor.  Will refill today. ? ?Essential hypertension, benign ?Slightly above goal though typically well controlled.  She may have an element of whitecoat hypertension.  She will continue losartan 100 mg daily.  She will let us know if persistently elevated at home. ? ? ?  ?Subjective:  ?HPI: ? ?See A/p for status of chronic conditions.  ? ?She needs medication refills today. ? ?She is also had ongoing issues with left knee pain secondary to osteoarthritis.  She has had cortisone injections done in the past which is worked well.  She saw orthopedics a few months ago at This time.  Did not seem to be as effective this most recent time.  Pain has recurred.  Located in outer left knee.  Sometimes radiates into her hip. ?   ?  ?Objective:  ?Physical Exam: ?BP (!) 148/88 (BP Location: Right Arm)   Pulse 90   Temp 98 ?F (36.7 ?C) (Temporal)   Ht 5\' 4"  (1.626 m)   Wt 177 lb (80.3 kg)   LMP 01/06/2011   SpO2 99%   BMI 30.38 kg/m?   ?Gen: No acute distress, resting comfortably ?CV:  Regular rate and rhythm with no murmurs appreciated ?Pulm: Normal work of breathing, clear to auscultation bilaterally with no crackles, wheezes, or rhonchi ?MSK ?- Knee: No gross abnormalities.  Mild effusion noted on left knee.  Significant crepitus with active and passive range of motion.  Neurovascular intact distally ?Neuro: Grossly normal, moves all extremities ?Psych: Normal affect and thought content ? ?Knee aspiration and injection Procedure Note ? ?Indication: Symptom relief of left Knee Pain. ? ?Procedure Details  ?Verbal consent was obtained for the procedure after discussing risks, benefits, and alternatives.  The superior lateral aspect of her left knee was prepped with alcohol.  Topical ethyl chloride was applied for anesthesia.  Approximately 2 cc of 1% lidocaine without epinephrine was then infiltrated into the subcutaneous area.  Needle withdrawn.  The area was then prepped with Betadine.  An 18-gauge needle was then inserted into the superior lateral aspect of her knee joint.  15 cc of serosanguineous fluid was aspirated.  The syringe was removed and a mixture of 3 ml 1% lidocaine and 1 ml of 80mg /cc Depo-Medrol was then injected into the joint. The needle was removed and the area cleansed and dressed. ? ?Complications:  None; patient tolerated the procedure well.  ? ?   ? ?03/08/2011. , MD ?11/03/2021 11:28 AM  ?

## 2021-11-03 NOTE — Assessment & Plan Note (Signed)
Doing well on Effexor.  Will refill today. ?

## 2021-12-01 ENCOUNTER — Other Ambulatory Visit: Payer: Self-pay | Admitting: Family Medicine

## 2021-12-02 ENCOUNTER — Other Ambulatory Visit: Payer: Self-pay | Admitting: Physician Assistant

## 2021-12-02 ENCOUNTER — Encounter: Payer: Self-pay | Admitting: Physician Assistant

## 2021-12-02 ENCOUNTER — Other Ambulatory Visit (HOSPITAL_BASED_OUTPATIENT_CLINIC_OR_DEPARTMENT_OTHER): Payer: Self-pay

## 2021-12-02 MED ORDER — AMPHETAMINE-DEXTROAMPHET ER 30 MG PO CP24: 30.0000 mg | ORAL_CAPSULE | ORAL | 0 refills | Status: DC

## 2021-12-02 MED ORDER — AMPHETAMINE-DEXTROAMPHET ER 30 MG PO CP24
30.0000 mg | ORAL_CAPSULE | ORAL | 0 refills | Status: DC
  Filled 2022-01-31: qty 30, 30d supply, fill #0

## 2021-12-02 MED ORDER — AMPHETAMINE-DEXTROAMPHET ER 30 MG PO CP24
30.0000 mg | ORAL_CAPSULE | ORAL | 0 refills | Status: DC
  Filled 2021-12-31: qty 30, 30d supply, fill #0

## 2021-12-02 MED ORDER — AMPHETAMINE-DEXTROAMPHET ER 30 MG PO CP24
30.0000 mg | ORAL_CAPSULE | ORAL | 0 refills | Status: DC
  Filled 2021-12-02: qty 30, 30d supply, fill #0

## 2021-12-30 ENCOUNTER — Other Ambulatory Visit (HOSPITAL_BASED_OUTPATIENT_CLINIC_OR_DEPARTMENT_OTHER): Payer: Self-pay

## 2021-12-31 ENCOUNTER — Other Ambulatory Visit (HOSPITAL_BASED_OUTPATIENT_CLINIC_OR_DEPARTMENT_OTHER): Payer: Self-pay

## 2022-01-20 ENCOUNTER — Other Ambulatory Visit: Payer: Self-pay | Admitting: Family Medicine

## 2022-01-26 ENCOUNTER — Other Ambulatory Visit: Payer: Self-pay | Admitting: Physician Assistant

## 2022-01-26 NOTE — Telephone Encounter (Signed)
Refills are already sent, not sure why we are getting another request?

## 2022-01-27 ENCOUNTER — Encounter (HOSPITAL_BASED_OUTPATIENT_CLINIC_OR_DEPARTMENT_OTHER): Payer: Self-pay | Admitting: Pharmacist

## 2022-01-27 ENCOUNTER — Other Ambulatory Visit (HOSPITAL_BASED_OUTPATIENT_CLINIC_OR_DEPARTMENT_OTHER): Payer: Self-pay

## 2022-01-31 ENCOUNTER — Other Ambulatory Visit (HOSPITAL_BASED_OUTPATIENT_CLINIC_OR_DEPARTMENT_OTHER): Payer: Self-pay

## 2022-02-25 ENCOUNTER — Other Ambulatory Visit: Payer: Self-pay | Admitting: Physician Assistant

## 2022-02-25 ENCOUNTER — Encounter (HOSPITAL_BASED_OUTPATIENT_CLINIC_OR_DEPARTMENT_OTHER): Payer: Self-pay

## 2022-02-25 ENCOUNTER — Other Ambulatory Visit (HOSPITAL_BASED_OUTPATIENT_CLINIC_OR_DEPARTMENT_OTHER): Payer: Self-pay

## 2022-02-25 MED ORDER — AMPHETAMINE-DEXTROAMPHET ER 30 MG PO CP24
30.0000 mg | ORAL_CAPSULE | ORAL | 0 refills | Status: DC
  Filled 2022-02-25 – 2022-03-31 (×2): qty 30, 30d supply, fill #0

## 2022-02-25 MED ORDER — AMPHETAMINE-DEXTROAMPHET ER 30 MG PO CP24
30.0000 mg | ORAL_CAPSULE | ORAL | 0 refills | Status: DC
  Filled 2022-02-25 – 2022-03-02 (×2): qty 30, 30d supply, fill #0

## 2022-02-25 MED ORDER — AMPHETAMINE-DEXTROAMPHET ER 30 MG PO CP24
30.0000 mg | ORAL_CAPSULE | ORAL | 0 refills | Status: DC
  Filled 2022-02-25: qty 30, 30d supply, fill #0

## 2022-02-25 NOTE — Telephone Encounter (Signed)
Pt requesting refill for Adderall XR 30 mg. Last OV 10/2021.

## 2022-02-28 ENCOUNTER — Other Ambulatory Visit (HOSPITAL_BASED_OUTPATIENT_CLINIC_OR_DEPARTMENT_OTHER): Payer: Self-pay

## 2022-02-28 ENCOUNTER — Encounter: Payer: Self-pay | Admitting: Physician Assistant

## 2022-03-02 ENCOUNTER — Other Ambulatory Visit: Payer: Self-pay | Admitting: Physician Assistant

## 2022-03-02 ENCOUNTER — Other Ambulatory Visit (HOSPITAL_BASED_OUTPATIENT_CLINIC_OR_DEPARTMENT_OTHER): Payer: Self-pay

## 2022-03-02 MED ORDER — AMPHETAMINE-DEXTROAMPHET ER 15 MG PO CP24
ORAL_CAPSULE | ORAL | 0 refills | Status: DC
  Filled 2022-03-02: qty 60, 30d supply, fill #0

## 2022-03-25 DIAGNOSIS — M1712 Unilateral primary osteoarthritis, left knee: Secondary | ICD-10-CM | POA: Insufficient documentation

## 2022-03-30 ENCOUNTER — Ambulatory Visit: Payer: Commercial Managed Care - HMO | Admitting: Physician Assistant

## 2022-03-30 NOTE — Progress Notes (Incomplete)
Christina Cowan is a 61 y.o. female here for a {New prob or follow up:31724}.  SCRIBE STATEMENT  History of Present Illness:   No chief complaint on file.   HPI  Past Medical History:  Diagnosis Date   Anxiety    Arthritis    Asthma    Essential hypertension, benign 06/17/2014   GERD (gastroesophageal reflux disease)    Pure hypercholesterolemia 06/17/2014     Social History   Tobacco Use   Smoking status: Every Day    Packs/day: 0.40    Years: 16.00    Total pack years: 6.40    Types: Cigarettes   Smokeless tobacco: Never  Substance Use Topics   Alcohol use: Yes    Alcohol/week: 4.0 standard drinks of alcohol    Types: 4 Glasses of wine per week    Comment: occ   Drug use: Yes    Types: Hydrocodone    Past Surgical History:  Procedure Laterality Date   CESAREAN SECTION      Family History  Problem Relation Age of Onset   Arthritis Mother    Hypertension Mother    Arthritis Father    Colon cancer Paternal Uncle     Allergies  Allergen Reactions   Prednisone Other (See Comments)    Bad thoughts -- only with oral prednisone, can tolerate injection per patient 01/15/20    Current Medications:   Current Outpatient Medications:    albuterol (VENTOLIN HFA) 108 (90 Base) MCG/ACT inhaler, Inhale 2 puffs into the lungs every 6 (six) hours as needed for wheezing or shortness of breath., Disp: 18 g, Rfl: 1   amphetamine-dextroamphetamine (ADDERALL XR) 30 MG 24 hr capsule, Take 1 capsule (30 mg total) by mouth every morning., Disp: 30 capsule, Rfl: 0   amphetamine-dextroamphetamine (ADDERALL XR) 30 MG 24 hr capsule, Take 1 capsule (30 mg total) by mouth every morning., Disp: 30 capsule, Rfl: 0   amphetamine-dextroamphetamine (ADDERALL XR) 30 MG 24 hr capsule, Take 1 capsule (30 mg total) by mouth every morning., Disp: 30 capsule, Rfl: 0   atorvastatin (LIPITOR) 80 MG tablet, Take 1 tablet (80 mg total) by mouth daily., Disp: 90 tablet, Rfl: 3   fexofenadine (ALLEGRA)  180 MG tablet, Take 180 mg by mouth daily., Disp: , Rfl:    fluticasone (FLONASE) 50 MCG/ACT nasal spray, Place 1 spray into both nostrils daily., Disp: , Rfl:    losartan (COZAAR) 100 MG tablet, TAKE ONE TABLET BY MOUTH DAILY, Disp: 30 tablet, Rfl: 1   venlafaxine XR (EFFEXOR-XR) 150 MG 24 hr capsule, TAKE TWO CAPSULES BY MOUTH EVERY MORNING WITH BREAKFAST, Disp: 180 capsule, Rfl: 1   Review of Systems:   ROS Negative unless otherwise specified per HPI.   Vitals:   There were no vitals filed for this visit.   There is no height or weight on file to calculate BMI.  Physical Exam:   Physical Exam  Assessment and Plan:   @DIAGLIST @    I,Savera Zaman,acting as a scribe for , PA.,have documented all relevant documentation on the behalf of [employer], PA,as directed by  [name], PA while in the presence of [name], [name].   ***  [place], PA-C

## 2022-03-31 ENCOUNTER — Encounter: Payer: Self-pay | Admitting: Physician Assistant

## 2022-03-31 ENCOUNTER — Ambulatory Visit (INDEPENDENT_AMBULATORY_CARE_PROVIDER_SITE_OTHER): Payer: Commercial Managed Care - HMO | Admitting: Physician Assistant

## 2022-03-31 ENCOUNTER — Ambulatory Visit: Payer: Commercial Managed Care - HMO | Admitting: Physician Assistant

## 2022-03-31 VITALS — BP 140/80 | HR 81 | Temp 97.3°F | Ht 64.0 in | Wt 180.0 lb

## 2022-03-31 DIAGNOSIS — I10 Essential (primary) hypertension: Secondary | ICD-10-CM | POA: Diagnosis not present

## 2022-03-31 DIAGNOSIS — Z01818 Encounter for other preprocedural examination: Secondary | ICD-10-CM

## 2022-03-31 DIAGNOSIS — Z720 Tobacco use: Secondary | ICD-10-CM

## 2022-03-31 DIAGNOSIS — E782 Mixed hyperlipidemia: Secondary | ICD-10-CM

## 2022-03-31 DIAGNOSIS — F909 Attention-deficit hyperactivity disorder, unspecified type: Secondary | ICD-10-CM

## 2022-03-31 DIAGNOSIS — F341 Dysthymic disorder: Secondary | ICD-10-CM | POA: Diagnosis not present

## 2022-03-31 MED ORDER — LOSARTAN POTASSIUM 100 MG PO TABS: 100.0000 mg | ORAL_TABLET | Freq: Every day | ORAL | 0 refills | Status: DC

## 2022-03-31 MED ORDER — VENLAFAXINE HCL ER 150 MG PO CP24: ORAL_CAPSULE | ORAL | 0 refills | Status: DC

## 2022-03-31 MED ORDER — ATORVASTATIN CALCIUM 80 MG PO TABS: 80.0000 mg | ORAL_TABLET | Freq: Every day | ORAL | 0 refills | Status: DC

## 2022-03-31 NOTE — Patient Instructions (Signed)
It was great to see you!  Please continue to monitor your blood pressure for [place].  Refills sent in.  Follow-up in 3 months for blood pressure recheck and physical.  An order for labs has been put in for you. To get your labs, [facility] without a scheduled appointment.  The address is [address]. It is across the street from [facility]. Lab is located in the basement.  Hours of operation are M-F 8:30am to 5:00pm. Please note that they are closed for lunch between 12:30 and 1:00pm.  As soon as I review your blood work, I will send note to [facility]  EKG looks ok.  Take care,  [name] PA-C

## 2022-03-31 NOTE — Progress Notes (Signed)
Christina Cowan is a 61 y.o. female here for a follow up on left knee pain.    History of Present Illness:   Chief Complaint  Patient presents with   Pre-op Exam    HPI  Surgical Clearance; Left knee Pain Patient has been dealing with left knee pain for the past several years. Has a longstanding hx of osteoarthritis. She has tried several medications in the past with no relief. Has had cortisone shot in the past which has worked well for a while. She has seen orthopedics but does not find this to be effective this time. Patient is undergoing left total knee arthroplasty on September 12th. She has been doing well other than knee pain.   Had knee arthroscopy on left knee about 10 years ago. Has tolerated anesthesia well in the past.  Per pt, she is able to participate in physical activity without any chest pain or SOB. No unusual swelling. Would like to get blood work done today as well as EKG.  Denies any other concerns.   Tobacco abuse She continues to smoke. She is not ready to quit.  HTN Currently taking Losartan 100 mg daily with no issue At home blood pressure readings are: checked often. States her BP ranging 130-142 systolic at home. Requesting refill on Losartan today. Patient denies chest pain, SOB, blurred vision, dizziness, unusual headaches, lower leg swelling. Patient is compliant with medication. Denies excessive caffeine intake, stimulant usage, excessive alcohol intake, or increase in salt consumption.  BP Readings from Last 3 Encounters:  03/31/22 (!) 140/80  11/03/21 (!) 148/88  04/06/21 140/78    Dysthymic Disorder  Patient is currently taking Effexor XR 300 mg daily with no complications. She has been doing well with this regimen. No adverse side effects. Requesting refill on her medications. She denies any other concerns. Denies SI/HI.   ADD  She is currently taking Adderall XR 30 mg. Tolerating this well with no side effects. Denies any trouble sleeping or other  issues. Denies any concerns.   HLD- Has been doing well on Lipitor 80 mg daily with no complications. Denies any concerns. Requesting refill today.   Past Medical History:  Diagnosis Date   Anxiety    Arthritis    Asthma    Essential hypertension, benign 06/17/2014   GERD (gastroesophageal reflux disease)    Pure hypercholesterolemia 06/17/2014     Social History   Tobacco Use   Smoking status: Every Day    Packs/day: 0.40    Years: 16.00    Total pack years: 6.40    Types: Cigarettes   Smokeless tobacco: Never  Substance Use Topics   Alcohol use: Yes    Alcohol/week: 4.0 standard drinks of alcohol    Types: 4 Glasses of wine per week    Comment: occ   Drug use: Yes    Types: Hydrocodone    Past Surgical History:  Procedure Laterality Date   CESAREAN SECTION      Family History  Problem Relation Age of Onset   Arthritis Mother    Hypertension Mother    Arthritis Father    Colon cancer Paternal Uncle     Allergies  Allergen Reactions   Prednisone Other (See Comments)    Bad thoughts -- only with oral prednisone, can tolerate injection per patient 01/15/20    Current Medications:   Current Outpatient Medications:    albuterol (VENTOLIN HFA) 108 (90 Base) MCG/ACT inhaler, Inhale 2 puffs into the lungs every 6 (six) hours  as needed for wheezing or shortness of breath., Disp: 18 g, Rfl: 1   amphetamine-dextroamphetamine (ADDERALL XR) 30 MG 24 hr capsule, Take 1 capsule (30 mg total) by mouth every morning., Disp: 30 capsule, Rfl: 0   fexofenadine (ALLEGRA) 180 MG tablet, Take 180 mg by mouth daily., Disp: , Rfl:    fluticasone (FLONASE) 50 MCG/ACT nasal spray, Place 1 spray into both nostrils daily., Disp: , Rfl:    meloxicam (MOBIC) 15 MG tablet, Take 1 tablet every day by oral route for 30 days., Disp: , Rfl:    amphetamine-dextroamphetamine (ADDERALL XR) 30 MG 24 hr capsule, Take 1 capsule (30 mg total) by mouth every morning., Disp: 30 capsule, Rfl: 0    amphetamine-dextroamphetamine (ADDERALL XR) 30 MG 24 hr capsule, Take 1 capsule (30 mg total) by mouth every morning., Disp: 30 capsule, Rfl: 0   atorvastatin (LIPITOR) 80 MG tablet, Take 1 tablet (80 mg total) by mouth daily., Disp: 90 tablet, Rfl: 0   losartan (COZAAR) 100 MG tablet, Take 1 tablet (100 mg total) by mouth daily., Disp: 90 tablet, Rfl: 0   venlafaxine XR (EFFEXOR-XR) 150 MG 24 hr capsule, TAKE TWO CAPSULES BY MOUTH EVERY MORNING WITH BREAKFAST, Disp: 180 capsule, Rfl: 0   Review of Systems:   ROS Negative unless otherwise specified per HPI.   Vitals:   Vitals:   03/31/22 0814  BP: (!) 140/80  Pulse: 81  Temp: (!) 97.3 F (36.3 C)  TempSrc: Temporal  SpO2: 97%  Weight: 180 lb (81.6 kg)  Height: 5\' 4"  (1.626 m)     Body mass index is 30.9 kg/m.  Physical Exam:   Physical Exam Vitals and nursing note reviewed.  Constitutional:      General: She is not in acute distress.    Appearance: She is well-developed. She is not ill-appearing or toxic-appearing.  Cardiovascular:     Rate and Rhythm: Normal rate and regular rhythm.     Pulses: Normal pulses.     Heart sounds: Normal heart sounds, S1 normal and S2 normal.  Pulmonary:     Effort: Pulmonary effort is normal.     Breath sounds: Normal breath sounds.  Skin:    General: Skin is warm and dry.  Neurological:     Mental Status: She is alert.     GCS: GCS eye subscore is 4. GCS verbal subscore is 5. GCS motor subscore is 6.  Psychiatric:        Speech: Speech normal.        Behavior: Behavior normal. Behavior is cooperative.     Assessment and Plan:   Pre-op exam EKG tracing is personally reviewed.  EKG notes NSR.  No acute changes.  Update blood work today Suspect overall low risk candidate for surgery, except for smoking  Dysthymic disorder Stable per patient Continue effexor 300 mg daily Follow-up in 3 months, sooner if concerns  Essential hypertension, benign Overall controlled Continue  losartan 100 mg daily Recommend close monitoring at home Will not increase ADHD stimulants  Attention deficit hyperactivity disorder (ADHD), unspecified ADHD type Overall controlled Continue adderall 30 mg xr daily Follow-up in 3 months, sooner if concerns  Mixed hyperlipidemia Update lipid panel and provide recommendations accordingly to lipitor 80 mg  Tobacco abuse Recommend cessation   I,Savera Zaman,acting as a scribe for , PA.,have documented all relevant documentation on the behalf of [employer], PA,as directed by  [name], PA while in the presence of [name], [name].  Cecil Cobbs, PA, have reviewed all documentation for this visit. The documentation on 03/31/22 for the exam, diagnosis, procedures, and orders are all accurate and complete.   [name], PA-C

## 2022-04-01 ENCOUNTER — Other Ambulatory Visit (HOSPITAL_BASED_OUTPATIENT_CLINIC_OR_DEPARTMENT_OTHER): Payer: Self-pay

## 2022-04-14 ENCOUNTER — Other Ambulatory Visit (INDEPENDENT_AMBULATORY_CARE_PROVIDER_SITE_OTHER): Payer: Commercial Managed Care - HMO

## 2022-04-14 ENCOUNTER — Other Ambulatory Visit: Payer: Self-pay | Admitting: Physician Assistant

## 2022-04-14 DIAGNOSIS — Z01818 Encounter for other preprocedural examination: Secondary | ICD-10-CM

## 2022-04-14 DIAGNOSIS — E782 Mixed hyperlipidemia: Secondary | ICD-10-CM

## 2022-04-14 LAB — CBC WITH DIFFERENTIAL/PLATELET
Basophils Absolute: 0 10*3/uL (ref 0.0–0.1)
Basophils Relative: 0.5 % (ref 0.0–3.0)
Eosinophils Absolute: 0.2 10*3/uL (ref 0.0–0.7)
Eosinophils Relative: 1.9 % (ref 0.0–5.0)
HCT: 41.6 % (ref 36.0–46.0)
Hemoglobin: 14 g/dL (ref 12.0–15.0)
Lymphocytes Relative: 33.8 % (ref 12.0–46.0)
Lymphs Abs: 2.8 10*3/uL (ref 0.7–4.0)
MCHC: 33.6 g/dL (ref 30.0–36.0)
MCV: 93 fl (ref 78.0–100.0)
Monocytes Absolute: 0.4 10*3/uL (ref 0.1–1.0)
Monocytes Relative: 4.7 % (ref 3.0–12.0)
Neutro Abs: 4.9 10*3/uL (ref 1.4–7.7)
Neutrophils Relative %: 59.1 % (ref 43.0–77.0)
Platelets: 294 10*3/uL (ref 150.0–400.0)
RBC: 4.47 Mil/uL (ref 3.87–5.11)
RDW: 14.1 % (ref 11.5–15.5)
WBC: 8.2 10*3/uL (ref 4.0–10.5)

## 2022-04-14 LAB — COMPREHENSIVE METABOLIC PANEL
ALT: 17 U/L (ref 0–35)
AST: 15 U/L (ref 0–37)
Albumin: 4.5 g/dL (ref 3.5–5.2)
Alkaline Phosphatase: 77 U/L (ref 39–117)
BUN: 12 mg/dL (ref 6–23)
CO2: 25 mEq/L (ref 19–32)
Calcium: 9.2 mg/dL (ref 8.4–10.5)
Chloride: 102 mEq/L (ref 96–112)
Creatinine, Ser: 1.03 mg/dL (ref 0.40–1.20)
GFR: 58.28 mL/min — ABNORMAL LOW (ref >60.00)
Glucose, Bld: 92 mg/dL (ref 70–99)
Potassium: 3.4 mEq/L — ABNORMAL LOW (ref 3.5–5.1)
Sodium: 139 mEq/L (ref 135–145)
Total Bilirubin: 0.5 mg/dL (ref 0.2–1.2)
Total Protein: 7.3 g/dL (ref 6.0–8.3)

## 2022-04-14 LAB — LDL CHOLESTEROL, DIRECT: Direct LDL: 140 mg/dL

## 2022-04-14 LAB — HEMOGLOBIN A1C: Hgb A1c MFr Bld: 5.4 % (ref 4.6–6.5)

## 2022-04-14 LAB — LIPID PANEL
Cholesterol: 281 mg/dL — ABNORMAL HIGH (ref 0–200)
HDL: 55.5 mg/dL (ref >39.00)
Total CHOL/HDL Ratio: 5
Triglycerides: 579 mg/dL — ABNORMAL HIGH (ref 0.0–149.0)

## 2022-04-20 ENCOUNTER — Other Ambulatory Visit: Payer: Self-pay | Admitting: Physician Assistant

## 2022-04-20 ENCOUNTER — Encounter (HOSPITAL_BASED_OUTPATIENT_CLINIC_OR_DEPARTMENT_OTHER): Payer: Self-pay | Admitting: Pharmacist

## 2022-04-20 ENCOUNTER — Other Ambulatory Visit (HOSPITAL_BASED_OUTPATIENT_CLINIC_OR_DEPARTMENT_OTHER): Payer: Self-pay

## 2022-04-20 DIAGNOSIS — I10 Essential (primary) hypertension: Secondary | ICD-10-CM

## 2022-04-20 DIAGNOSIS — Z01818 Encounter for other preprocedural examination: Secondary | ICD-10-CM

## 2022-04-20 DIAGNOSIS — E782 Mixed hyperlipidemia: Secondary | ICD-10-CM

## 2022-04-20 MED ORDER — ICOSAPENT ETHYL 1 G PO CAPS
2.0000 g | ORAL_CAPSULE | Freq: Two times a day (BID) | ORAL | 0 refills | Status: DC
  Filled 2022-04-20 – 2022-08-25 (×4): qty 120, 30d supply, fill #0

## 2022-04-21 ENCOUNTER — Telehealth: Payer: Self-pay | Admitting: *Deleted

## 2022-04-21 NOTE — Telephone Encounter (Signed)
PA for Icosapent Ethyl (Vascepa) 1 g capsule done through [employer]. Awaiting determination.

## 2022-04-22 ENCOUNTER — Other Ambulatory Visit (HOSPITAL_BASED_OUTPATIENT_CLINIC_OR_DEPARTMENT_OTHER): Payer: Self-pay

## 2022-04-25 ENCOUNTER — Other Ambulatory Visit: Payer: Self-pay | Admitting: Physician Assistant

## 2022-04-25 ENCOUNTER — Other Ambulatory Visit (HOSPITAL_BASED_OUTPATIENT_CLINIC_OR_DEPARTMENT_OTHER): Payer: Self-pay

## 2022-04-26 ENCOUNTER — Other Ambulatory Visit (HOSPITAL_BASED_OUTPATIENT_CLINIC_OR_DEPARTMENT_OTHER): Payer: Self-pay

## 2022-04-27 ENCOUNTER — Other Ambulatory Visit (HOSPITAL_BASED_OUTPATIENT_CLINIC_OR_DEPARTMENT_OTHER): Payer: Self-pay

## 2022-04-27 ENCOUNTER — Encounter (HOSPITAL_BASED_OUTPATIENT_CLINIC_OR_DEPARTMENT_OTHER): Payer: Self-pay | Admitting: Pharmacist

## 2022-04-27 MED ORDER — AMPHETAMINE-DEXTROAMPHET ER 30 MG PO CP24
30.0000 mg | ORAL_CAPSULE | ORAL | 0 refills | Status: DC
  Filled 2022-04-27: qty 30, 30d supply, fill #0
  Filled 2022-04-29: qty 3, 3d supply, fill #0
  Filled 2022-04-29: qty 27, 27d supply, fill #0

## 2022-04-27 NOTE — Telephone Encounter (Signed)
Pt requesting refill for Adderall XR 30 mg. Last OV 03/31/2022. 

## 2022-04-28 ENCOUNTER — Other Ambulatory Visit (HOSPITAL_BASED_OUTPATIENT_CLINIC_OR_DEPARTMENT_OTHER): Payer: Self-pay

## 2022-04-29 ENCOUNTER — Ambulatory Visit: Payer: Commercial Managed Care - HMO | Attending: Cardiology | Admitting: Cardiology

## 2022-04-29 ENCOUNTER — Other Ambulatory Visit: Payer: Self-pay | Admitting: Family Medicine

## 2022-04-29 ENCOUNTER — Other Ambulatory Visit (HOSPITAL_BASED_OUTPATIENT_CLINIC_OR_DEPARTMENT_OTHER): Payer: Self-pay

## 2022-04-29 ENCOUNTER — Encounter: Payer: Self-pay | Admitting: Cardiology

## 2022-04-29 VITALS — BP 140/70 | HR 85 | Ht 64.0 in | Wt 175.0 lb

## 2022-04-29 DIAGNOSIS — Z01818 Encounter for other preprocedural examination: Secondary | ICD-10-CM

## 2022-04-29 DIAGNOSIS — E782 Mixed hyperlipidemia: Secondary | ICD-10-CM | POA: Diagnosis not present

## 2022-04-29 DIAGNOSIS — I10 Essential (primary) hypertension: Secondary | ICD-10-CM | POA: Diagnosis not present

## 2022-04-29 DIAGNOSIS — F341 Dysthymic disorder: Secondary | ICD-10-CM

## 2022-04-29 NOTE — Progress Notes (Signed)
Cardiology Office Note:    Date:  04/29/2022   ID:  Christina Cowan, DOB Aug 05, 1961, MRN [id number]  PCP:  Christina Coke, PA   [facility] Providers Cardiologist:  None     Referring MD: Christina Coke, PA   History of Present Illness:    Christina Cowan is a 61 y.o. female here for the evaluation of hypertension and mixed hyperlipidemia at the request of Christina Cowan, [place].   She also presents for preoperative clearance for left total knee arthroplasty to be performed by Dr. Wynelle Cowan on 05/10/2022. She had been dealing with left knee pain for several years due to osteoarthritis.   At her 03/31/2022 appointment with Christina Coke, PA she reported at home blood pressures ranging 123XX123 systolic on losartan 123XX123 mg. She was also tolerating 80 mg atorvastatin. As of 04/14/2022, her LDL was 140 and triglycerides were 579.  Today: In the last month her left knee pain has been a little less severe, but she knows her knee pain will only worsen if she does not proceed with the total knee arthroplasty. The other day while at Dr. Aubrii Cowan office, she was able to take the stairs instead of the elevator. She endorses being able to walk for a city block without significant cardiovascular symptoms. After her knee surgery she plans to work on returning to her walking routines for exercise.  Every now and then she will experience random tingling sensations in her left hand and fingers. Sometimes this occurs while driving, but may occur at any time.  She has not yet started Vascepa as she is waiting for insurance approval.  Currently she is smoking, but "not a whole lot." Previously she was able to quit smoking while she was pregnant.  She denies any palpitations, chest pain, shortness of breath, or peripheral edema. No lightheadedness, headaches, syncope, orthopnea, or PND.  She notes her father is also a patient of mine. He recently turned 62 yo and still rides his motorcycle.   Past Medical History:   Diagnosis Date   Anxiety    Arthritis    Asthma    Essential hypertension, benign 06/17/2014   GERD (gastroesophageal reflux disease)    Pure hypercholesterolemia 06/17/2014    Past Surgical History:  Procedure Laterality Date   CESAREAN SECTION      Current Medications: Current Meds  Medication Sig   albuterol (VENTOLIN HFA) 108 (90 Base) MCG/ACT inhaler Inhale 2 puffs into the lungs every 6 (six) hours as needed for wheezing or shortness of breath.   amphetamine-dextroamphetamine (ADDERALL XR) 30 MG 24 hr capsule Take 1 capsule (30 mg total) by mouth every morning.   atorvastatin (LIPITOR) 80 MG tablet Take 1 tablet (80 mg total) by mouth daily.   fexofenadine (ALLEGRA) 180 MG tablet Take 180 mg by mouth daily.   fluticasone (FLONASE) 50 MCG/ACT nasal spray Place 1 spray into both nostrils daily.   icosapent Ethyl (VASCEPA) 1 g capsule Take 2 capsules (2 g total) by mouth 2 (two) times daily.   losartan (COZAAR) 100 MG tablet Take 1 tablet (100 mg total) by mouth daily.   meloxicam (MOBIC) 15 MG tablet Take 1 tablet every day by oral route for 30 days.   venlafaxine XR (EFFEXOR-XR) 150 MG 24 hr capsule TAKE TWO CAPSULES BY MOUTH EVERY MORNING WITH BREAKFAST     Allergies:   Prednisone   Social History   Socioeconomic History   Marital status: Widowed    Spouse name: Not on file   Number  of children: Not on file   Years of education: Not on file   Highest education level: Not on file  Occupational History   Not on file  Tobacco Use   Smoking status: Every Day    Packs/day: 0.40    Years: 16.00    Total pack years: 6.40    Types: Cigarettes   Smokeless tobacco: Never  Substance and Sexual Activity   Alcohol use: Yes    Alcohol/week: 4.0 standard drinks of alcohol    Types: 4 Glasses of wine per week    Comment: occ   Drug use: Yes    Types: Hydrocodone   Sexual activity: Yes    Birth control/protection: I.U.D.  Other Topics Concern   Not on file  Social  History Narrative   Not on file   Social Determinants of Health   Financial Resource Strain: Not on file  Food Insecurity: Not on file  Transportation Needs: Not on file  Physical Activity: Not on file  Stress: Not on file  Social Connections: Not on file     Family History: The patient's family history includes Arthritis in her father and mother; Colon cancer in her paternal uncle; Hypertension in her mother.  ROS:   Please see the history of present illness.    (+) Left knee pain (+) Tingling sensations of left hand and fingers All other systems reviewed and are negative.  EKGs/Labs/Other Studies Reviewed:    The following studies were reviewed today:  Chest X-Ray 03/23/2010: Findings: Heart and mediastinal contours normal.  There is central  airway thickening with mildly accentuated lung markings.  No  consolidation or pleural fluid.  Osseous structures intact with  mild degenerative changes of the T-spine.     IMPRESSION:  Chronic changes as above.  No acute findings.   EKG:  EKG is personally reviewed and interpreted. 04/29/2022: Sinus rhythm. Rate 85 bpm. 03/31/2022 Christina Cowan, [place]):  NSR. No acute changes.   Recent Labs: 04/14/2022: ALT 17; BUN 12; Creatinine, Ser 1.03; Hemoglobin 14.0; Platelets 294.0; Potassium 3.4; Sodium 139   Recent Lipid Panel    Component Value Date/Time   CHOL 281 (H) 04/14/2022 1403   TRIG (H) 04/14/2022 1403    579.0 Triglyceride is over 400; calculations on Lipids are invalid.   HDL 55.50 04/14/2022 1403   CHOLHDL 5 04/14/2022 1403   VLDL 55.0 (H) 12/12/2017 1116   LDLCALC  04/08/2020 0817     Comment:     . LDL cholesterol not calculated. Triglyceride levels greater than 400 mg/dL invalidate calculated LDL results. . Reference range: <100 . Desirable range <100 mg/dL for primary prevention;   <70 mg/dL for patients with CHD or diabetic patients  with > or = 2 CHD risk factors. Marland Kitchen LDL-C is now calculated using the  Martin-Hopkins  calculation, which is a validated novel method providing  better accuracy than the Friedewald equation in the  estimation of LDL-C.  Christina Cowan et al. Lenox Ahr. 9518;841(66): 2061-2068  (http://education.[facility].com/faq/FAQ164)    LDLDIRECT 140.0 04/14/2022 1403     Risk Assessment/Calculations:          Physical Exam:    VS:  BP (!) 140/70 (BP Location: Left Arm, Patient Position: Sitting, Cuff Size: Normal)   Pulse 85   Ht 5\' 4"  (1.626 m)   Wt 175 lb (79.4 kg)   LMP 01/06/2011   SpO2 97%   BMI 30.04 kg/m     Wt Readings from Last 3 Encounters:  04/29/22 175  lb (79.4 kg)  03/31/22 180 lb (81.6 kg)  11/03/21 177 lb (80.3 kg)     GEN: Well nourished, well developed in no acute distress HEENT: Normal NECK: No JVD; No carotid bruits LYMPHATICS: No lymphadenopathy CARDIAC: RRR, no murmurs, rubs, gallops RESPIRATORY:  Clear to auscultation without rales, wheezing or rhonchi  ABDOMEN: Soft, non-tender, non-distended MUSCULOSKELETAL:  No edema; No deformity  SKIN: Warm and dry NEUROLOGIC:  Alert and oriented x 3 PSYCHIATRIC:  Normal affect   ASSESSMENT:    1. Mixed hyperlipidemia   2. Encounter for preoperative examination for general surgical procedure   3. Essential hypertension, benign    PLAN:    In order of problems listed above:  Preoperative cardiac risk stratification -She may proceed with left knee surgery, Dr. Lequita Halt with low overall cardiac risk.  She is able to complete greater than 4 METS of activity without any anginal symptoms.  ECG is unremarkable.  No significant shortness of breath.  Tobacco use - Continue to encourage cessation at length.  She was able to quit while pregnant with her son several years ago.  Mixed hyperlipidemia - Currently on atorvastatin 80 mg.  Just recently was prescribed Vascepa.  Excellent.  Triglycerides were 579.  Decrease carbohydrates.  LDL 140 direct.  No prior cardiac events.  Mother and father  did not have any early cardiac events.  In 3 months, I will have her touch base with our pharmacy lead lipid clinic to ensure that her triglycerides are under better control.    Follow-up: 3 months with Lipid clinic.  Medication Adjustments/Labs and Tests Ordered: Current medicines are reviewed at length with the patient today.  Concerns regarding medicines are outlined above.   Orders Placed This Encounter  Procedures   AMB Referral to [facility] Pharm-D   EKG 12-Lead   No orders of the defined types were placed in this encounter.  Patient Instructions  Medication Instructions:  The current medical regimen is effective;  continue present plan and medications.  *If you need a refill on your cardiac medications before your next appointment, please call your pharmacy*  You have been referred to the Lipid Clinic and need to be scheduled in approximately 3 months.  Follow-Up: At [facility], you and your health needs are our priority.  As part of our continuing mission to provide you with exceptional heart care, we have created designated Provider Care Teams.  These Care Teams include your primary Cardiologist (physician) and Advanced Practice Providers (APPs -  Physician Assistants and Nurse Practitioners) who all work together to provide you with the care you need, when you need it.  We recommend signing up for the patient portal called "MyChart".  Sign up information is provided on this After Visit Summary.  MyChart is used to connect with patients for Virtual Visits (Telemedicine).  Patients are able to view lab/test results, encounter notes, upcoming appointments, etc.  Non-urgent messages can be sent to your provider as well.   To learn more about what you can do with MyChart, go to [email or web address].    Your next appointment:   Follow up with Dr Aolanis Cowan as needed.   Important Information About Sugar         I,Mathew Stumpf,acting as a scribe for Donato Schultz,  MD.,have documented all relevant documentation on the behalf of Donato Schultz, MD,as directed by  Donato Schultz, MD while in the presence of Donato Schultz, MD.  I, Donato Schultz, MD, have reviewed all documentation for this visit.  The documentation on 04/29/22 for the exam, diagnosis, procedures, and orders are all accurate and complete.   Signed, Candee Furbish, MD  04/29/2022 5:22 PM    [facility]

## 2022-04-29 NOTE — Patient Instructions (Signed)
Medication Instructions:  The current medical regimen is effective;  continue present plan and medications.  *If you need a refill on your cardiac medications before your next appointment, please call your pharmacy*  You have been referred to the Lipid Clinic and need to be scheduled in approximately 3 months.  Follow-Up: At [facility], you and your health needs are our priority.  As part of our continuing mission to provide you with exceptional heart care, we have created designated Provider Care Teams.  These Care Teams include your primary Cardiologist (physician) and Advanced Practice Providers (APPs -  Physician Assistants and Nurse Practitioners) who all work together to provide you with the care you need, when you need it.  We recommend signing up for the patient portal called "MyChart".  Sign up information is provided on this After Visit Summary.  MyChart is used to connect with patients for Virtual Visits (Telemedicine).  Patients are able to view lab/test results, encounter notes, upcoming appointments, etc.  Non-urgent messages can be sent to your provider as well.   To learn more about what you can do with MyChart, go to [email or web address].    Your next appointment:   Follow up with Dr Kanya Fu as needed.   Important Information About Sugar

## 2022-05-03 ENCOUNTER — Other Ambulatory Visit (HOSPITAL_BASED_OUTPATIENT_CLINIC_OR_DEPARTMENT_OTHER): Payer: Self-pay

## 2022-05-04 ENCOUNTER — Other Ambulatory Visit (HOSPITAL_BASED_OUTPATIENT_CLINIC_OR_DEPARTMENT_OTHER): Payer: Self-pay

## 2022-05-06 ENCOUNTER — Other Ambulatory Visit (HOSPITAL_BASED_OUTPATIENT_CLINIC_OR_DEPARTMENT_OTHER): Payer: Self-pay

## 2022-05-09 ENCOUNTER — Other Ambulatory Visit (HOSPITAL_BASED_OUTPATIENT_CLINIC_OR_DEPARTMENT_OTHER): Payer: Self-pay

## 2022-05-09 ENCOUNTER — Encounter (HOSPITAL_BASED_OUTPATIENT_CLINIC_OR_DEPARTMENT_OTHER): Payer: Self-pay

## 2022-05-11 NOTE — Telephone Encounter (Signed)
Received fax from [employer], Icosapent ethyl 1 g capsule has been denied by insurance.

## 2022-05-13 ENCOUNTER — Other Ambulatory Visit (HOSPITAL_BASED_OUTPATIENT_CLINIC_OR_DEPARTMENT_OTHER): Payer: Self-pay

## 2022-05-13 ENCOUNTER — Other Ambulatory Visit: Payer: Self-pay | Admitting: Physician Assistant

## 2022-05-13 MED ORDER — EZETIMIBE 10 MG PO TABS
10.0000 mg | ORAL_TABLET | Freq: Every day | ORAL | 1 refills | Status: DC
  Filled 2022-05-13 – 2022-05-30 (×2): qty 30, 30d supply, fill #0
  Filled 2022-06-30: qty 30, 30d supply, fill #1

## 2022-05-13 NOTE — Telephone Encounter (Signed)
Left message on voicemail to call office.  

## 2022-05-13 NOTE — Telephone Encounter (Signed)
Christina Cowan, pt would like to know if there is any other medication she could try?

## 2022-05-13 NOTE — Telephone Encounter (Signed)
Spoke to pt told her PA for Vascepa 1 g has been denied by insurance. Pt verbalized understanding and asked if there was any other medications she could try. Told pt I am not sure I do not think there is, but I will check with [name] and let you know. Pt verbalized understanding.

## 2022-05-16 NOTE — Telephone Encounter (Signed)
Left message on voicemail to call office.  

## 2022-05-16 NOTE — Telephone Encounter (Signed)
Pt called back told her Samantha sent to the pharmacy Zetia for her to help with Cholesterol. She said you could also take over the counter fish oil.  When she follows up with cardiology, I recommend that she ask them if they can help get his medication approved. Pt verbalized understanding.

## 2022-05-23 ENCOUNTER — Encounter: Payer: Self-pay | Admitting: *Deleted

## 2022-05-26 ENCOUNTER — Other Ambulatory Visit (HOSPITAL_BASED_OUTPATIENT_CLINIC_OR_DEPARTMENT_OTHER): Payer: Self-pay

## 2022-05-30 ENCOUNTER — Other Ambulatory Visit (HOSPITAL_BASED_OUTPATIENT_CLINIC_OR_DEPARTMENT_OTHER): Payer: Self-pay

## 2022-05-30 ENCOUNTER — Other Ambulatory Visit: Payer: Self-pay | Admitting: Physician Assistant

## 2022-05-30 MED ORDER — AMPHETAMINE-DEXTROAMPHET ER 30 MG PO CP24
30.0000 mg | ORAL_CAPSULE | ORAL | 0 refills | Status: DC
  Filled 2022-05-30: qty 30, 30d supply, fill #0

## 2022-05-30 NOTE — Telephone Encounter (Signed)
Pt requesting refill for Adderall XR 30 mg. Last OV 03/31/2022.

## 2022-06-07 ENCOUNTER — Other Ambulatory Visit (HOSPITAL_BASED_OUTPATIENT_CLINIC_OR_DEPARTMENT_OTHER): Payer: Self-pay

## 2022-06-11 ENCOUNTER — Encounter (HOSPITAL_BASED_OUTPATIENT_CLINIC_OR_DEPARTMENT_OTHER): Payer: Self-pay | Admitting: Pharmacist

## 2022-06-11 ENCOUNTER — Other Ambulatory Visit (HOSPITAL_BASED_OUTPATIENT_CLINIC_OR_DEPARTMENT_OTHER): Payer: Self-pay

## 2022-06-23 ENCOUNTER — Other Ambulatory Visit: Payer: Self-pay | Admitting: Physician Assistant

## 2022-06-24 ENCOUNTER — Other Ambulatory Visit (HOSPITAL_BASED_OUTPATIENT_CLINIC_OR_DEPARTMENT_OTHER): Payer: Self-pay

## 2022-06-24 MED ORDER — AMPHETAMINE-DEXTROAMPHET ER 30 MG PO CP24
30.0000 mg | ORAL_CAPSULE | ORAL | 0 refills | Status: DC
  Filled 2022-06-24 – 2022-06-30 (×2): qty 30, 30d supply, fill #0

## 2022-06-29 ENCOUNTER — Other Ambulatory Visit: Payer: Self-pay | Admitting: Physician Assistant

## 2022-06-29 DIAGNOSIS — F341 Dysthymic disorder: Secondary | ICD-10-CM

## 2022-06-29 MED ORDER — VENLAFAXINE HCL ER 150 MG PO CP24: 300.0000 mg | ORAL_CAPSULE | Freq: Every day | ORAL | 1 refills | Status: DC

## 2022-06-29 MED ORDER — EZETIMIBE 10 MG PO TABS: 10.0000 mg | ORAL_TABLET | Freq: Every day | ORAL | 1 refills | Status: DC

## 2022-06-29 MED ORDER — LOSARTAN POTASSIUM 100 MG PO TABS: 100.0000 mg | ORAL_TABLET | Freq: Every day | ORAL | 1 refills | Status: DC

## 2022-06-30 ENCOUNTER — Other Ambulatory Visit (HOSPITAL_BASED_OUTPATIENT_CLINIC_OR_DEPARTMENT_OTHER): Payer: Self-pay

## 2022-07-25 ENCOUNTER — Encounter: Payer: Self-pay | Admitting: Physician Assistant

## 2022-07-27 ENCOUNTER — Other Ambulatory Visit (HOSPITAL_BASED_OUTPATIENT_CLINIC_OR_DEPARTMENT_OTHER): Payer: Self-pay

## 2022-07-27 MED ORDER — AMPHETAMINE-DEXTROAMPHET ER 30 MG PO CP24
30.0000 mg | ORAL_CAPSULE | ORAL | 0 refills | Status: DC
  Filled 2022-07-27: qty 30, 30d supply, fill #0

## 2022-07-28 ENCOUNTER — Other Ambulatory Visit (HOSPITAL_BASED_OUTPATIENT_CLINIC_OR_DEPARTMENT_OTHER): Payer: Self-pay

## 2022-07-29 ENCOUNTER — Other Ambulatory Visit: Payer: Self-pay

## 2022-07-29 ENCOUNTER — Other Ambulatory Visit (HOSPITAL_BASED_OUTPATIENT_CLINIC_OR_DEPARTMENT_OTHER): Payer: Self-pay

## 2022-08-01 ENCOUNTER — Other Ambulatory Visit (HOSPITAL_BASED_OUTPATIENT_CLINIC_OR_DEPARTMENT_OTHER): Payer: Self-pay

## 2022-08-04 ENCOUNTER — Ambulatory Visit: Payer: Commercial Managed Care - HMO

## 2022-08-04 NOTE — Progress Notes (Deleted)
Patient ID: Christina Cowan                 DOB: [birth year]                    MRN: [id number]      HPI: Christina Cowan is a 61 y.o. female patient referred to lipid clinic by Dr. Lyfe Fu. PMH is significant for HTN, HLD, recent L TKA. Most recent labs show LDL-D of 140mg , TG 579. Vascepa and zetia were added.   Compliance? Needs repeat labs Cost    Reviewed options for lowering LDL cholesterol, including ezetimibe, PCSK-9 inhibitors, bempedoic acid and inclisiran.  Discussed mechanisms of action, dosing, side effects and potential decreases in LDL cholesterol.  Also reviewed cost information and potential options for patient assistance.   Current Medications: atrovastatin 80mg  daily, Vascepa 2g BID, ezetimibe 10mg  daily Intolerances:  Risk Factors:  LDL goal:   Diet:   Exercise:   Family History:   Social History: + tobacco  Labs: Lipid Panel     Component Value Date/Time   CHOL 281 (H) 04/14/2022 1403   TRIG (H) 04/14/2022 1403    579.0 Triglyceride is over 400; calculations on Lipids are invalid.   HDL 55.50 04/14/2022 1403   CHOLHDL 5 04/14/2022 1403   VLDL 55.0 (H) 12/12/2017 1116   LDLCALC  04/08/2020 0817     Comment:     . LDL cholesterol not calculated. Triglyceride levels greater than 400 mg/dL invalidate calculated LDL results. . Reference range: <100 . Desirable range <100 mg/dL for primary prevention;   <70 mg/dL for patients with CHD or diabetic patients  with > or = 2 CHD risk factors. 04/16/2022 LDL-C is now calculated using the Martin-Hopkins  calculation, which is a validated novel method providing  better accuracy than the Friedewald equation in the  estimation of LDL-C.  12/14/2017 et al. 06/08/2020. Marland Kitchen): 2061-2068  (http://education.[facility].com/faq/FAQ164)    LDLDIRECT 140.0 04/14/2022 1403    Past Medical History:  Diagnosis Date   Anxiety    Arthritis    Asthma    Essential hypertension, benign 06/17/2014   GERD (gastroesophageal reflux  disease)    Pure hypercholesterolemia 06/17/2014    Current Outpatient Medications on File Prior to Visit  Medication Sig Dispense Refill   albuterol (VENTOLIN HFA) 108 (90 Base) MCG/ACT inhaler Inhale 2 puffs into the lungs every 6 (six) hours as needed for wheezing or shortness of breath. 18 g 1   amphetamine-dextroamphetamine (ADDERALL XR) 30 MG 24 hr capsule Take 1 capsule (30 mg total) by mouth every morning. 30 capsule 0   atorvastatin (LIPITOR) 80 MG tablet Take 1 tablet (80 mg total) by mouth daily. 90 tablet 0   ezetimibe (ZETIA) 10 MG tablet Take 1 tablet (10 mg total) by mouth daily. 90 tablet 1   fexofenadine (ALLEGRA) 180 MG tablet Take 180 mg by mouth daily.     fluticasone (FLONASE) 50 MCG/ACT nasal spray Place 1 spray into both nostrils daily.     icosapent Ethyl (VASCEPA) 1 g capsule Take 2 capsules (2 g total) by mouth 2 (two) times daily. 120 capsule 0   losartan (COZAAR) 100 MG tablet Take 1 tablet (100 mg total) by mouth daily. 90 tablet 1   meloxicam (MOBIC) 15 MG tablet Take 1 tablet every day by oral route for 30 days.     venlafaxine XR (EFFEXOR-XR) 150 MG 24 hr capsule Take 2 capsules (300 mg total) by mouth daily with breakfast.  180 capsule 1   No current facility-administered medications on file prior to visit.    Allergies  Allergen Reactions   Prednisone Other (See Comments)    Bad thoughts -- only with oral prednisone, can tolerate injection per patient 01/15/20    Assessment/Plan:  1. Hyperlipidemia -  No problem-specific Assessment & Plan notes found for this encounter.  @MTPCOMPLETEDLIST @  {f/u with PharmD:[id number]}  Thank you,  , Pharm.D, BCPS, CPP [facility] of [facility] [address], [place], [place]  Phone: [phone]; Fax: [phone]

## 2022-08-11 ENCOUNTER — Encounter: Payer: Self-pay | Admitting: *Deleted

## 2022-08-25 ENCOUNTER — Encounter (HOSPITAL_BASED_OUTPATIENT_CLINIC_OR_DEPARTMENT_OTHER): Payer: Self-pay | Admitting: Pharmacist

## 2022-08-25 ENCOUNTER — Other Ambulatory Visit (HOSPITAL_BASED_OUTPATIENT_CLINIC_OR_DEPARTMENT_OTHER): Payer: Self-pay

## 2022-08-25 ENCOUNTER — Other Ambulatory Visit: Payer: Self-pay | Admitting: Physician Assistant

## 2022-08-26 ENCOUNTER — Other Ambulatory Visit (HOSPITAL_BASED_OUTPATIENT_CLINIC_OR_DEPARTMENT_OTHER): Payer: Self-pay

## 2022-08-26 ENCOUNTER — Encounter (HOSPITAL_BASED_OUTPATIENT_CLINIC_OR_DEPARTMENT_OTHER): Payer: Self-pay | Admitting: Pharmacist

## 2022-08-26 MED ORDER — AMPHETAMINE-DEXTROAMPHET ER 30 MG PO CP24
30.0000 mg | ORAL_CAPSULE | ORAL | 0 refills | Status: DC
  Filled 2022-08-26 – 2022-08-30 (×2): qty 30, 30d supply, fill #0

## 2022-08-30 ENCOUNTER — Other Ambulatory Visit (HOSPITAL_BASED_OUTPATIENT_CLINIC_OR_DEPARTMENT_OTHER): Payer: Self-pay

## 2022-09-16 ENCOUNTER — Encounter: Payer: Self-pay | Admitting: Physician Assistant

## 2022-09-16 ENCOUNTER — Ambulatory Visit (INDEPENDENT_AMBULATORY_CARE_PROVIDER_SITE_OTHER): Payer: Commercial Managed Care - HMO | Admitting: Physician Assistant

## 2022-09-16 VITALS — BP 130/80 | HR 88 | Temp 97.5°F | Ht 64.0 in | Wt 179.2 lb

## 2022-09-16 DIAGNOSIS — U071 COVID-19: Secondary | ICD-10-CM

## 2022-09-16 DIAGNOSIS — H9202 Otalgia, left ear: Secondary | ICD-10-CM

## 2022-09-16 LAB — POC COVID19 BINAXNOW: SARS Coronavirus 2 Ag: POSITIVE — AB

## 2022-09-16 LAB — POC INFLUENZA A&B (BINAX/QUICKVUE)
Influenza A, POC: NEGATIVE
Influenza B, POC: NEGATIVE

## 2022-09-16 MED ORDER — MOLNUPIRAVIR EUA 200MG CAPSULE: 4.0000 | ORAL_CAPSULE | Freq: Two times a day (BID) | ORAL | 0 refills | Status: AC

## 2022-09-16 MED ORDER — METHYLPREDNISOLONE ACETATE 80 MG/ML IJ SUSP
80.0000 mg | Freq: Once | INTRAMUSCULAR | Status: AC
  Administered 2022-09-16: 80 mg via INTRAMUSCULAR

## 2022-09-16 MED ORDER — AMOXICILLIN 875 MG PO TABS: 875.0000 mg | ORAL_TABLET | Freq: Two times a day (BID) | ORAL | 0 refills | Status: AC

## 2022-09-16 NOTE — Patient Instructions (Addendum)
It was great to see you!  You have COVID and a left ear infection.  Use medication as prescribed: molnupiravir (lagevrio) is for the COVID virus and amoxicillin is for your ear infection  We also gave you a steroid injection today  - Please watch closely for new onset shortness of breath, worsening shortness of breath, dizziness, confusion or any worsening symptoms. If any of these occur, please contact us during business hours, and if after business hours, please seek urgent care or go to the closest emergency room.  -Consider purchasing a pulse oximeter. If your levels are 94% or below persistently, please seek care at the hospital.   -If you test positive for COVID, everyone, regardless of vaccination status, should stay home for 5 days since symptom onset (or if asymptomatic, on day of positive test.) If you have no symptoms or your symptoms are resolving after 5 days, you can leave your house. Continue to wear a mask around others for 5 additional days. If you have a fever, continue to stay home until your fever resolves without use of medication.  -Please inform any contacts of your positive result so they can appropriately quarantine/test.  -Push fluids and try to eat small, frequent meals with protein to maintain your stamina.

## 2022-09-16 NOTE — Progress Notes (Signed)
Christina Cowan is a 62 y.o. female here for a new problem.  History of Present Illness:   Chief Complaint  Patient presents with   Sinus Problem    Pt c/o nasal congestion with bloody / brown discharge. Fever 100.4, started Wed. Also having sinus pressure, dry cough.    HPI  Patient reports COVID symptoms that started on Tuesday. Her boyfriend was sick and gave her his illness. She has had sinus pressure, cough, congestion, fever of 100.4. She has had bloody discharge from her nose at times. She is a smoker. She has had COVID in the past and did well with this. She has had 1 COVID-vaccine.  She denies chest pain, shortness of breath, dizziness, lightheadedness, GI symptoms.  She does have history of asthma and likely underlying either COPD or emphysema given smoking history.  She has been using albuterol inhaler as needed.  Past Medical History:  Diagnosis Date   Anxiety    Arthritis    Asthma    Essential hypertension, benign 06/17/2014   GERD (gastroesophageal reflux disease)    Pure hypercholesterolemia 06/17/2014     Social History   Tobacco Use   Smoking status: Every Day    Packs/day: 0.40    Years: 16.00    Total pack years: 6.40    Types: Cigarettes   Smokeless tobacco: Never  Substance Use Topics   Alcohol use: Yes    Alcohol/week: 4.0 standard drinks of alcohol    Types: 4 Glasses of wine per week    Comment: occ   Drug use: Yes    Types: Hydrocodone    Past Surgical History:  Procedure Laterality Date   CESAREAN SECTION      Family History  Problem Relation Age of Onset   Arthritis Mother    Hypertension Mother    Arthritis Father    Colon cancer Paternal Uncle     Allergies  Allergen Reactions   Prednisone Other (See Comments)    Bad thoughts -- only with oral prednisone, can tolerate injection per patient 01/15/20    Current Medications:   Current Outpatient Medications:    albuterol (VENTOLIN HFA) 108 (90 Base) MCG/ACT inhaler, Inhale  2 puffs into the lungs every 6 (six) hours as needed for wheezing or shortness of breath., Disp: 18 g, Rfl: 1   amoxicillin (AMOXIL) 875 MG tablet, Take 1 tablet (875 mg total) by mouth 2 (two) times daily for 7 days., Disp: 14 tablet, Rfl: 0   amphetamine-dextroamphetamine (ADDERALL XR) 30 MG 24 hr capsule, Take 1 capsule (30 mg total) by mouth every morning., Disp: 30 capsule, Rfl: 0   atorvastatin (LIPITOR) 80 MG tablet, Take 1 tablet (80 mg total) by mouth daily., Disp: 90 tablet, Rfl: 0   ezetimibe (ZETIA) 10 MG tablet, Take 1 tablet (10 mg total) by mouth daily., Disp: 90 tablet, Rfl: 1   fexofenadine (ALLEGRA) 180 MG tablet, Take 180 mg by mouth daily., Disp: , Rfl:    fluticasone (FLONASE) 50 MCG/ACT nasal spray, Place 1 spray into both nostrils daily., Disp: , Rfl:    losartan (COZAAR) 100 MG tablet, Take 1 tablet (100 mg total) by mouth daily., Disp: 90 tablet, Rfl: 1   molnupiravir EUA (LAGEVRIO) 200 mg CAPS capsule, Take 4 capsules (800 mg total) by mouth 2 (two) times daily for 5 days., Disp: 40 capsule, Rfl: 0   venlafaxine XR (EFFEXOR-XR) 150 MG 24 hr capsule, Take 2 capsules (300 mg total) by mouth daily with breakfast., Disp:  180 capsule, Rfl: 1   Review of Systems:   ROS Negative unless otherwise specified per HPI.   Vitals:   Vitals:   09/16/22 1140  BP: 130/80  Pulse: 88  Temp: (!) 97.5 F (36.4 C)  TempSrc: Temporal  SpO2: 98%  Weight: 179 lb 4 oz (81.3 kg)  Height: 5\' 4"  (1.626 m)     Body mass index is 30.77 kg/m.  Physical Exam:   Physical Exam Vitals and nursing note reviewed.  Constitutional:      General: She is not in acute distress.    Appearance: Normal appearance. She is well-developed. She is not ill-appearing or toxic-appearing.  HENT:     Head: Normocephalic and atraumatic.     Right Ear: Ear canal and external ear normal. A middle ear effusion is present. Tympanic membrane is not erythematous, retracted or bulging.     Left Ear: Ear canal  and external ear normal. A middle ear effusion is present. Tympanic membrane is erythematous and bulging. Tympanic membrane is not retracted.     Nose: Nose normal.     Right Sinus: No maxillary sinus tenderness or frontal sinus tenderness.     Left Sinus: No maxillary sinus tenderness or frontal sinus tenderness.     Mouth/Throat:     Pharynx: Uvula midline. No posterior oropharyngeal erythema.  Eyes:     General: Lids are normal.     Extraocular Movements: Extraocular movements intact.     Conjunctiva/sclera: Conjunctivae normal.     Pupils: Pupils are equal, round, and reactive to light.  Neck:     Trachea: Trachea normal.  Cardiovascular:     Rate and Rhythm: Normal rate and regular rhythm.     Heart sounds: Normal heart sounds, S1 normal and S2 normal. No murmur heard.    No gallop.  Pulmonary:     Effort: Pulmonary effort is normal. No respiratory distress.     Breath sounds: Normal breath sounds. No decreased breath sounds, wheezing, rhonchi or rales.  Lymphadenopathy:     Cervical: No cervical adenopathy.  Skin:    General: Skin is warm and dry.  Neurological:     Mental Status: She is alert and oriented to person, place, and time.  Psychiatric:        Speech: Speech normal.        Behavior: Behavior normal. Behavior is cooperative.        Judgment: Judgment normal.    Results for orders placed or performed in visit on 09/16/22  POC Influenza A&B(BINAX/QUICKVUE)  Result Value Ref Range   Influenza A, POC Negative Negative   Influenza B, POC Negative Negative  POC COVID-19  Result Value Ref Range   SARS Coronavirus 2 Ag Positive (A) Negative    Assessment and Plan:   COVID-19 COVID test is positive No red flags on exam.  Will initiate molnupiravir per orders.  Due to asthma history we also discussed starting oral prednisone however she cannot tolerate oral prednisone but cannot tolerate Depo-Medrol injection.  80 mg Depo-Medrol injection provided today.    Isolation guidelines discussed.   Discussed taking medications as prescribed. Reviewed ER precautions including worsening fever, SOB, worsening cough or other concerns. Push fluids and rest.   Left ear pain Exam consistent with acute otitis media Treat with oral amoxicillin per orders Follow-up if any new or worsening symptoms    I,Verona Buck,acting as a scribe for [employer], PA.,have documented all relevant documentation on the behalf of Inda Coke, PA,as  directed by  [name], PA while in the presence of [name], [place].  I, [place], [place], have reviewed all documentation for this visit. The documentation on 09/16/22 for the exam, diagnosis, procedures, and orders are all accurate and complete.   [name], PA-C

## 2022-09-27 ENCOUNTER — Other Ambulatory Visit: Payer: Self-pay | Admitting: Physician Assistant

## 2022-09-27 ENCOUNTER — Other Ambulatory Visit (HOSPITAL_BASED_OUTPATIENT_CLINIC_OR_DEPARTMENT_OTHER): Payer: Self-pay

## 2022-09-27 MED ORDER — AMPHETAMINE-DEXTROAMPHET ER 30 MG PO CP24
30.0000 mg | ORAL_CAPSULE | ORAL | 0 refills | Status: DC
  Filled 2022-09-27: qty 30, 30d supply, fill #0

## 2022-09-28 ENCOUNTER — Other Ambulatory Visit (HOSPITAL_BASED_OUTPATIENT_CLINIC_OR_DEPARTMENT_OTHER): Payer: Self-pay

## 2022-10-26 ENCOUNTER — Other Ambulatory Visit: Payer: Self-pay | Admitting: Physician Assistant

## 2022-10-27 ENCOUNTER — Other Ambulatory Visit (HOSPITAL_BASED_OUTPATIENT_CLINIC_OR_DEPARTMENT_OTHER): Payer: Self-pay

## 2022-10-27 MED ORDER — AMPHETAMINE-DEXTROAMPHET ER 30 MG PO CP24
30.0000 mg | ORAL_CAPSULE | ORAL | 0 refills | Status: DC
  Filled 2022-10-27: qty 30, 30d supply, fill #0

## 2022-10-27 NOTE — Telephone Encounter (Signed)
Pt requesting refill for Adderall XR 30 mg. Scheduled to see you tomorrow 10/28/2022.

## 2022-10-28 ENCOUNTER — Ambulatory Visit: Payer: Commercial Managed Care - HMO | Admitting: Physician Assistant

## 2022-11-07 ENCOUNTER — Other Ambulatory Visit (HOSPITAL_BASED_OUTPATIENT_CLINIC_OR_DEPARTMENT_OTHER): Payer: Self-pay

## 2022-11-23 ENCOUNTER — Other Ambulatory Visit (HOSPITAL_BASED_OUTPATIENT_CLINIC_OR_DEPARTMENT_OTHER): Payer: Self-pay

## 2022-11-23 ENCOUNTER — Other Ambulatory Visit: Payer: Self-pay | Admitting: Physician Assistant

## 2022-11-23 MED ORDER — AMPHETAMINE-DEXTROAMPHET ER 30 MG PO CP24
30.0000 mg | ORAL_CAPSULE | ORAL | 0 refills | Status: DC
  Filled 2022-11-23: qty 30, 30d supply, fill #0

## 2022-11-23 NOTE — Telephone Encounter (Signed)
Pt requesting refill for Adderall XR 30 mg. Last OV 09/16/2022.

## 2022-12-20 ENCOUNTER — Other Ambulatory Visit: Payer: Self-pay | Admitting: Physician Assistant

## 2022-12-20 NOTE — Telephone Encounter (Signed)
Pt requesting refill for adderall XR 30 mg. Last OV 08/2022.

## 2022-12-21 ENCOUNTER — Other Ambulatory Visit: Payer: Self-pay | Admitting: Physician Assistant

## 2022-12-21 ENCOUNTER — Other Ambulatory Visit (HOSPITAL_BASED_OUTPATIENT_CLINIC_OR_DEPARTMENT_OTHER): Payer: Self-pay

## 2022-12-21 NOTE — Telephone Encounter (Signed)
Please refuse. duplicate

## 2022-12-22 ENCOUNTER — Other Ambulatory Visit: Payer: Self-pay | Admitting: Physician Assistant

## 2022-12-22 ENCOUNTER — Encounter: Payer: Self-pay | Admitting: Physician Assistant

## 2022-12-22 NOTE — Telephone Encounter (Signed)
Please refuse again.

## 2022-12-23 ENCOUNTER — Other Ambulatory Visit (HOSPITAL_BASED_OUTPATIENT_CLINIC_OR_DEPARTMENT_OTHER): Payer: Self-pay

## 2022-12-23 NOTE — Telephone Encounter (Signed)
Noted  

## 2022-12-26 NOTE — Progress Notes (Signed)
Christina Cowan is a 62 y.o. female here for a follow up.  History of Present Illness:   No chief complaint on file.   HPI   Medication: ***      Past Medical History:  Diagnosis Date   Anxiety    Arthritis    Asthma    Essential hypertension, benign 06/17/2014   GERD (gastroesophageal reflux disease)    Pure hypercholesterolemia 06/17/2014     Social History   Tobacco Use   Smoking status: Every Day    Packs/day: 0.40    Years: 16.00    Additional pack years: 0.00    Total pack years: 6.40    Types: Cigarettes   Smokeless tobacco: Never  Substance Use Topics   Alcohol use: Yes    Alcohol/week: 4.0 standard drinks of alcohol    Types: 4 Glasses of wine per week    Comment: occ   Drug use: Yes    Types: Hydrocodone    Past Surgical History:  Procedure Laterality Date   CESAREAN SECTION      Family History  Problem Relation Age of Onset   Arthritis Mother    Hypertension Mother    Arthritis Father    Colon cancer Paternal Uncle     Allergies  Allergen Reactions   Prednisone Other (See Comments)    Bad thoughts -- only with oral prednisone, can tolerate injection per patient 01/15/20    Current Medications:   Current Outpatient Medications:    albuterol (VENTOLIN HFA) 108 (90 Base) MCG/ACT inhaler, Inhale 2 puffs into the lungs every 6 (six) hours as needed for wheezing or shortness of breath., Disp: 18 g, Rfl: 1   amphetamine-dextroamphetamine (ADDERALL XR) 30 MG 24 hr capsule, Take 1 capsule (30 mg total) by mouth every morning., Disp: 30 capsule, Rfl: 0   atorvastatin (LIPITOR) 80 MG tablet, Take 1 tablet (80 mg total) by mouth daily., Disp: 90 tablet, Rfl: 0   ezetimibe (ZETIA) 10 MG tablet, Take 1 tablet (10 mg total) by mouth daily., Disp: 90 tablet, Rfl: 1   fexofenadine (ALLEGRA) 180 MG tablet, Take 180 mg by mouth daily., Disp: , Rfl:    fluticasone (FLONASE) 50 MCG/ACT nasal spray, Place 1 spray into both nostrils daily., Disp: , Rfl:     losartan (COZAAR) 100 MG tablet, Take 1 tablet (100 mg total) by mouth daily., Disp: 90 tablet, Rfl: 1   venlafaxine XR (EFFEXOR-XR) 150 MG 24 hr capsule, Take 2 capsules (300 mg total) by mouth daily with breakfast., Disp: 180 capsule, Rfl: 1   Review of Systems:   ROS  Vitals:   There were no vitals filed for this visit.   There is no height or weight on file to calculate BMI.  Physical Exam:   Physical Exam  Assessment and Plan:   There are no diagnoses linked to this encounter.  I,Rachel Rivera,acting as a Neurosurgeon for [employer], PA.,have documented all relevant documentation on the behalf of [name], PA,as directed by  [name], PA while in the presence of [name], [place].  ***  [name], PA-C

## 2022-12-27 ENCOUNTER — Ambulatory Visit (INDEPENDENT_AMBULATORY_CARE_PROVIDER_SITE_OTHER): Payer: Commercial Managed Care - HMO | Admitting: Physician Assistant

## 2022-12-27 ENCOUNTER — Other Ambulatory Visit (HOSPITAL_BASED_OUTPATIENT_CLINIC_OR_DEPARTMENT_OTHER): Payer: Self-pay

## 2022-12-27 ENCOUNTER — Other Ambulatory Visit: Payer: Self-pay | Admitting: *Deleted

## 2022-12-27 ENCOUNTER — Encounter: Payer: Self-pay | Admitting: Physician Assistant

## 2022-12-27 ENCOUNTER — Encounter (HOSPITAL_BASED_OUTPATIENT_CLINIC_OR_DEPARTMENT_OTHER): Payer: Self-pay

## 2022-12-27 VITALS — BP 164/100 | HR 81 | Temp 98.0°F | Ht 64.0 in | Wt 185.0 lb

## 2022-12-27 DIAGNOSIS — R21 Rash and other nonspecific skin eruption: Secondary | ICD-10-CM

## 2022-12-27 DIAGNOSIS — L988 Other specified disorders of the skin and subcutaneous tissue: Secondary | ICD-10-CM

## 2022-12-27 DIAGNOSIS — I10 Essential (primary) hypertension: Secondary | ICD-10-CM | POA: Diagnosis not present

## 2022-12-27 DIAGNOSIS — F909 Attention-deficit hyperactivity disorder, unspecified type: Secondary | ICD-10-CM | POA: Diagnosis not present

## 2022-12-27 DIAGNOSIS — Z1211 Encounter for screening for malignant neoplasm of colon: Secondary | ICD-10-CM | POA: Diagnosis not present

## 2022-12-27 MED ORDER — AMPHETAMINE-DEXTROAMPHET ER 30 MG PO CP24
30.0000 mg | ORAL_CAPSULE | ORAL | 0 refills | Status: DC
  Filled 2022-12-27: qty 30, 30d supply, fill #0

## 2022-12-27 MED ORDER — TRIAMCINOLONE ACETONIDE 0.5 % EX OINT
1.0000 | TOPICAL_OINTMENT | Freq: Two times a day (BID) | CUTANEOUS | 0 refills | Status: DC
  Filled 2022-12-27: qty 30, 15d supply, fill #0

## 2022-12-27 MED ORDER — AMPHETAMINE-DEXTROAMPHET ER 30 MG PO CP24
30.0000 mg | ORAL_CAPSULE | ORAL | 0 refills | Status: DC
  Filled 2023-02-24 – 2023-03-23 (×4): qty 30, 30d supply, fill #0

## 2022-12-27 MED ORDER — DOXYCYCLINE HYCLATE 100 MG PO TABS
100.0000 mg | ORAL_TABLET | Freq: Two times a day (BID) | ORAL | 0 refills | Status: DC
  Filled 2022-12-27: qty 14, 7d supply, fill #0

## 2022-12-27 MED ORDER — BUSPIRONE HCL 15 MG PO TABS
15.0000 mg | ORAL_TABLET | Freq: Two times a day (BID) | ORAL | 0 refills | Status: DC | PRN
  Filled 2022-12-27: qty 60, 30d supply, fill #0

## 2022-12-27 MED ORDER — LOSARTAN POTASSIUM 100 MG PO TABS: 100.0000 mg | ORAL_TABLET | Freq: Every day | ORAL | 1 refills | Status: DC

## 2022-12-27 MED ORDER — AMPHETAMINE-DEXTROAMPHET ER 30 MG PO CP24
30.0000 mg | ORAL_CAPSULE | ORAL | 0 refills | Status: DC
  Filled 2023-01-25: qty 30, 30d supply, fill #0

## 2022-12-27 NOTE — Telephone Encounter (Signed)
Pt is scheduled today. They keep sending this over. Please refuse.

## 2022-12-27 NOTE — Patient Instructions (Addendum)
It was great to see you!  Keep an eye on your blood pressure at home If it remains >140/90, please call us  Refills sent  Dermatology referral placed -- please call [phone]  to schedule Please call and schedule mammogram at your convenience Gastroenterology referral placed  Start doxyxycline antibiotic for your breast lesion Please call and schedule your mammogram  Start triamcinolone ointment for your itching arm skin  Start buspirone 15 mg twice daily as needed for your anxiety  If you develop suicidal thoughts, please tell someone and immediately proceed to our local 24/7 crisis center, [facility] [address], [place], [place] [address] [phone].  Follow-up in 1-3 months - WE WILL ALSO DO YOUR PAP SMEAR  Take care,  [name] PA-C

## 2023-01-24 ENCOUNTER — Other Ambulatory Visit: Payer: Self-pay | Admitting: Physician Assistant

## 2023-01-24 ENCOUNTER — Other Ambulatory Visit (HOSPITAL_BASED_OUTPATIENT_CLINIC_OR_DEPARTMENT_OTHER): Payer: Self-pay

## 2023-01-24 NOTE — Telephone Encounter (Signed)
Pt called back, told her Samantha sent 3 refills in last time, should have refills on file. Please contact the pharmacy. Pt verbalized understanding.

## 2023-01-24 NOTE — Telephone Encounter (Signed)
Pt requesting refill for Adderall XR 30 mg capsule. Last OV 12/27/2022.

## 2023-01-24 NOTE — Telephone Encounter (Signed)
Left message on voicemail to call office.  

## 2023-01-25 ENCOUNTER — Other Ambulatory Visit (HOSPITAL_BASED_OUTPATIENT_CLINIC_OR_DEPARTMENT_OTHER): Payer: Self-pay

## 2023-02-06 ENCOUNTER — Other Ambulatory Visit (HOSPITAL_BASED_OUTPATIENT_CLINIC_OR_DEPARTMENT_OTHER): Payer: Self-pay

## 2023-02-06 ENCOUNTER — Other Ambulatory Visit: Payer: Self-pay | Admitting: Physician Assistant

## 2023-02-06 MED ORDER — BUSPIRONE HCL 15 MG PO TABS
15.0000 mg | ORAL_TABLET | Freq: Two times a day (BID) | ORAL | 0 refills | Status: DC | PRN
  Filled 2023-02-06: qty 60, 30d supply, fill #0

## 2023-02-09 ENCOUNTER — Encounter: Payer: Self-pay | Admitting: Family Medicine

## 2023-02-09 ENCOUNTER — Encounter (HOSPITAL_COMMUNITY): Payer: Self-pay | Admitting: Emergency Medicine

## 2023-02-09 ENCOUNTER — Other Ambulatory Visit: Payer: Self-pay

## 2023-02-09 ENCOUNTER — Ambulatory Visit (INDEPENDENT_AMBULATORY_CARE_PROVIDER_SITE_OTHER): Payer: Commercial Managed Care - HMO | Admitting: Family Medicine

## 2023-02-09 ENCOUNTER — Other Ambulatory Visit (HOSPITAL_BASED_OUTPATIENT_CLINIC_OR_DEPARTMENT_OTHER): Payer: Self-pay

## 2023-02-09 ENCOUNTER — Emergency Department (HOSPITAL_COMMUNITY): Payer: Commercial Managed Care - HMO

## 2023-02-09 ENCOUNTER — Emergency Department (HOSPITAL_COMMUNITY)
Admission: EM | Admit: 2023-02-09 | Discharge: 2023-02-09 | Discharge status: Against Medical Advice | Payer: Commercial Managed Care - HMO | Attending: Emergency Medicine | Admitting: Emergency Medicine

## 2023-02-09 VITALS — BP 150/90 | HR 97 | Wt 184.8 lb

## 2023-02-09 DIAGNOSIS — L239 Allergic contact dermatitis, unspecified cause: Secondary | ICD-10-CM

## 2023-02-09 DIAGNOSIS — R079 Chest pain, unspecified: Secondary | ICD-10-CM | POA: Insufficient documentation

## 2023-02-09 DIAGNOSIS — I10 Essential (primary) hypertension: Secondary | ICD-10-CM | POA: Diagnosis not present

## 2023-02-09 DIAGNOSIS — R0789 Other chest pain: Secondary | ICD-10-CM | POA: Diagnosis not present

## 2023-02-09 DIAGNOSIS — Z5321 Procedure and treatment not carried out due to patient leaving prior to being seen by health care provider: Secondary | ICD-10-CM | POA: Insufficient documentation

## 2023-02-09 DIAGNOSIS — L255 Unspecified contact dermatitis due to plants, except food: Secondary | ICD-10-CM | POA: Diagnosis not present

## 2023-02-09 LAB — CBC
HCT: 42.8 % (ref 36.0–46.0)
Hemoglobin: 14.3 g/dL (ref 12.0–15.0)
MCH: 31.2 pg (ref 26.0–34.0)
MCHC: 33.4 g/dL (ref 30.0–36.0)
MCV: 93.2 fL (ref 80.0–100.0)
Platelets: 293 10*3/uL (ref 150–400)
RBC: 4.59 MIL/uL (ref 3.87–5.11)
RDW: 13.5 % (ref 11.5–15.5)
WBC: 8.1 10*3/uL (ref 4.0–10.5)
nRBC: 0 % (ref 0.0–0.2)

## 2023-02-09 LAB — TROPONIN I (HIGH SENSITIVITY)
Troponin I (High Sensitivity): 6 ng/L (ref <18)
Troponin I (High Sensitivity): 6 ng/L (ref <18)

## 2023-02-09 LAB — BASIC METABOLIC PANEL
Anion gap: 12 (ref 5–15)
BUN: 11 mg/dL (ref 8–23)
CO2: 26 mmol/L (ref 22–32)
Calcium: 9.5 mg/dL (ref 8.9–10.3)
Chloride: 99 mmol/L (ref 98–111)
Creatinine, Ser: 0.98 mg/dL (ref 0.44–1.00)
GFR, Estimated: >60 mL/min (ref >60)
Glucose, Bld: 89 mg/dL (ref 70–99)
Potassium: 2.9 mmol/L — ABNORMAL LOW (ref 3.5–5.1)
Sodium: 137 mmol/L (ref 135–145)

## 2023-02-09 MED ORDER — TRIAMCINOLONE ACETONIDE 0.1 % EX OINT
1.0000 | TOPICAL_OINTMENT | Freq: Three times a day (TID) | CUTANEOUS | 1 refills | Status: AC
  Filled 2023-02-09: qty 454, 30d supply, fill #0
  Filled 2023-06-12: qty 454, 30d supply, fill #1

## 2023-02-09 MED ORDER — METHYLPREDNISOLONE ACETATE 80 MG/ML IJ SUSP
80.0000 mg | Freq: Once | INTRAMUSCULAR | Status: AC
Start: 2023-02-09 — End: 2023-02-09
  Administered 2023-02-09: 80 mg via INTRAMUSCULAR

## 2023-02-09 MED ORDER — HYDROXYZINE HCL 25 MG PO TABS
12.5000 mg | ORAL_TABLET | Freq: Four times a day (QID) | ORAL | 0 refills | Status: DC | PRN
  Filled 2023-02-09: qty 45, 12d supply, fill #0
  Filled 2023-02-09: qty 45, fill #0

## 2023-02-09 NOTE — ED Notes (Signed)
Pt requested for IV to be removed so she could leave. Pt encouraged to stay for treatment but refused. Pt seen ambulating out of the department.

## 2023-02-09 NOTE — Progress Notes (Signed)
   Subjective:    Patient ID: Christina Cowan, female    DOB: 04-20-1961, 62 y.o.   MRN: [id number]  HPI Rash- 'i got into some poison ivy or something'  Sxs started Tuesday and 'it was full blown yesterday'.  Pt was doing yard work over the weekend.  Has a dog.  Rash is widespread- trunk, arms, legs, feet, breasts, buttocks, groin.  Pt reports she can do injectable prednisone but is unable to tolerate oral prednisone.   Review of Systems For ROS see HPI     Objective:   Physical Exam Vitals reviewed.  Constitutional:      General: She is not in acute distress.    Appearance: Normal appearance. She is not ill-appearing.  HENT:     Head: Normocephalic and atraumatic.  Skin:    General: Skin is warm and dry.     Findings: Rash (erythematous, vesicular rash of trunk, arms, legs, feet, breasts, buttocks, groin) present.  Neurological:     General: No focal deficit present.     Mental Status: She is alert and oriented to person, place, and time.  Psychiatric:        Mood and Affect: Mood normal.        Behavior: Behavior normal.        Thought Content: Thought content normal.           Assessment & Plan:  Contact dermatitis- new to provider, pt has hx of similar.  States she is able to have IM steroids but cannot tolerate oral steroids.  Depomedrol given.  Start topical Triamcinolone and hydroxyzine as needed for itching.  Reviewed supportive care and red flags that should prompt return.  Pt expressed understanding and is in agreement w/ plan.

## 2023-02-09 NOTE — Patient Instructions (Signed)
Follow up as needed or as scheduled CONTINUE your Allegra daily ADD the Hydroxyzine to help w/ itching USE the Triamcinolone ointment to help w/ the itchy areas AVOID hot showers- that will make the itching worse.  Cool water is better Call with any questions or concerns Hang in there!

## 2023-02-09 NOTE — ED Provider Triage Note (Cosign Needed)
Emergency Medicine Provider Triage Evaluation Note  Christina Cowan , a 62 y.o. female  was evaluated in triage.  Pt complains of left-sided burning chest pain that started while she was driving today.  Notes that pain does not radiate and was not associated with lightheadedness, dizziness, diaphoresis, nausea, vomiting, or syncope.  No recent shortness of breath or cough.  No history of chest pain this severe.  No radiating pain.  States that it has become worse throughout the day.  No personal history of ACS.  Review of Systems  Positive: See HPI Negative: See HPI   Physical Exam  BP (!) 168/86   Pulse 86   Temp 98.5 F (36.9 C)   Resp 18   Ht 5\' 4"  (1.626 m)   Wt 83 kg   LMP 01/06/2011   SpO2 98%   BMI 31.41 kg/m  Gen:   Awake, no distress   Resp:  Normal effort lungs clear to auscultation MSK:   Moves extremities without difficulty no lower extremity edema Other:  Regular rate and rhythm, alert and oriented, no acute distress  Medical Decision Making  Medically screening exam initiated at 5:37 PM.  Appropriate orders placed.  Keyasia Jolliff was informed that the remainder of the evaluation will be completed by another provider, this initial triage assessment does not replace that evaluation, and the importance of remaining in the ED until their evaluation is complete.     Tonette Lederer, PA-C 02/09/23 1739

## 2023-02-09 NOTE — ED Triage Notes (Signed)
Pt BIB EMS from home states she had what felt like indigestion around 1pm, took antacid with no relief. Pt had cortisone shot at PCP 3pm for poison ivy and reports increase in CP while driving home. Denies ShOB/dizziness and N/V. 324 ASA given by EMS. Pt states that the pain has now resolved.

## 2023-02-10 ENCOUNTER — Encounter: Payer: Self-pay | Admitting: Physician Assistant

## 2023-02-22 ENCOUNTER — Other Ambulatory Visit (HOSPITAL_BASED_OUTPATIENT_CLINIC_OR_DEPARTMENT_OTHER): Payer: Self-pay

## 2023-02-22 ENCOUNTER — Other Ambulatory Visit (HOSPITAL_COMMUNITY): Payer: Self-pay

## 2023-02-22 ENCOUNTER — Other Ambulatory Visit: Payer: Self-pay | Admitting: Physician Assistant

## 2023-02-22 MED ORDER — AMPHETAMINE-DEXTROAMPHET ER 30 MG PO CP24
30.0000 mg | ORAL_CAPSULE | ORAL | 0 refills | Status: DC
  Filled 2023-02-22: qty 30, 30d supply, fill #0

## 2023-02-22 NOTE — Telephone Encounter (Signed)
Requesting refill for Adderall XR 30 mg. Last OV 12/27/22.

## 2023-02-23 ENCOUNTER — Other Ambulatory Visit: Payer: Self-pay

## 2023-02-24 ENCOUNTER — Other Ambulatory Visit (HOSPITAL_BASED_OUTPATIENT_CLINIC_OR_DEPARTMENT_OTHER): Payer: Self-pay

## 2023-03-06 ENCOUNTER — Telehealth: Payer: Self-pay

## 2023-03-06 NOTE — Telephone Encounter (Signed)
Chart review completed for patient. Patient is due for screening mammogram. Mychart message sent to patient to inquire about scheduling mammogram.  Trung Wenzl, Population Health Specialist.  

## 2023-03-21 ENCOUNTER — Other Ambulatory Visit (HOSPITAL_BASED_OUTPATIENT_CLINIC_OR_DEPARTMENT_OTHER): Payer: Self-pay

## 2023-03-22 ENCOUNTER — Other Ambulatory Visit (HOSPITAL_BASED_OUTPATIENT_CLINIC_OR_DEPARTMENT_OTHER): Payer: Self-pay

## 2023-03-23 ENCOUNTER — Other Ambulatory Visit: Payer: Self-pay

## 2023-03-23 ENCOUNTER — Other Ambulatory Visit (HOSPITAL_BASED_OUTPATIENT_CLINIC_OR_DEPARTMENT_OTHER): Payer: Self-pay

## 2023-04-03 ENCOUNTER — Other Ambulatory Visit (HOSPITAL_BASED_OUTPATIENT_CLINIC_OR_DEPARTMENT_OTHER): Payer: Self-pay

## 2023-04-03 ENCOUNTER — Other Ambulatory Visit: Payer: Self-pay | Admitting: Physician Assistant

## 2023-04-03 ENCOUNTER — Encounter: Payer: Self-pay | Admitting: Physician Assistant

## 2023-04-03 DIAGNOSIS — F341 Dysthymic disorder: Secondary | ICD-10-CM

## 2023-04-03 MED ORDER — AMPHETAMINE-DEXTROAMPHET ER 30 MG PO CP24
30.0000 mg | ORAL_CAPSULE | ORAL | 0 refills | Status: DC
  Filled 2023-04-03 – 2023-04-20 (×2): qty 30, 30d supply, fill #0

## 2023-04-03 MED ORDER — ATORVASTATIN CALCIUM 80 MG PO TABS: 80.0000 mg | ORAL_TABLET | Freq: Every day | ORAL | 0 refills | Status: DC

## 2023-04-03 MED ORDER — AMPHETAMINE-DEXTROAMPHET ER 30 MG PO CP24
30.0000 mg | ORAL_CAPSULE | ORAL | 0 refills | Status: DC
  Filled 2023-05-17 – 2023-05-18 (×5): qty 30, 30d supply, fill #0

## 2023-04-03 MED ORDER — VENLAFAXINE HCL ER 150 MG PO CP24
300.0000 mg | ORAL_CAPSULE | Freq: Every day | ORAL | 1 refills | Status: DC
Start: 2023-04-03 — End: 2023-07-24

## 2023-04-03 MED ORDER — AMPHETAMINE-DEXTROAMPHET ER 30 MG PO CP24
30.0000 mg | ORAL_CAPSULE | ORAL | 0 refills | Status: DC
  Filled 2023-06-14: qty 30, 30d supply, fill #0

## 2023-04-03 NOTE — Telephone Encounter (Signed)
Pt requesting refill for Adderall XR 30 mg. Last OV  12/27/2022.

## 2023-04-06 ENCOUNTER — Other Ambulatory Visit: Payer: Self-pay | Admitting: Physician Assistant

## 2023-04-07 ENCOUNTER — Other Ambulatory Visit (HOSPITAL_BASED_OUTPATIENT_CLINIC_OR_DEPARTMENT_OTHER): Payer: Self-pay

## 2023-04-07 MED ORDER — BUSPIRONE HCL 15 MG PO TABS
15.0000 mg | ORAL_TABLET | Freq: Two times a day (BID) | ORAL | 0 refills | Status: DC | PRN
  Filled 2023-04-07 – 2023-04-20 (×3): qty 60, 30d supply, fill #0

## 2023-04-12 ENCOUNTER — Other Ambulatory Visit (HOSPITAL_BASED_OUTPATIENT_CLINIC_OR_DEPARTMENT_OTHER): Payer: Self-pay

## 2023-04-19 ENCOUNTER — Other Ambulatory Visit (HOSPITAL_BASED_OUTPATIENT_CLINIC_OR_DEPARTMENT_OTHER): Payer: Self-pay

## 2023-04-20 ENCOUNTER — Other Ambulatory Visit (HOSPITAL_COMMUNITY): Payer: Self-pay

## 2023-04-20 ENCOUNTER — Other Ambulatory Visit (HOSPITAL_BASED_OUTPATIENT_CLINIC_OR_DEPARTMENT_OTHER): Payer: Self-pay

## 2023-04-21 ENCOUNTER — Other Ambulatory Visit (HOSPITAL_COMMUNITY): Payer: Self-pay

## 2023-05-17 ENCOUNTER — Other Ambulatory Visit: Payer: Self-pay | Admitting: Physician Assistant

## 2023-05-17 ENCOUNTER — Other Ambulatory Visit (HOSPITAL_BASED_OUTPATIENT_CLINIC_OR_DEPARTMENT_OTHER): Payer: Self-pay

## 2023-05-17 NOTE — Telephone Encounter (Signed)
[facility] spoke to [place] told her received refill request and pt should have 2 more on file. Colin Mulders said yes, they are here will get Rx ready for pt. Told her okay, thank you.

## 2023-05-18 ENCOUNTER — Other Ambulatory Visit (HOSPITAL_BASED_OUTPATIENT_CLINIC_OR_DEPARTMENT_OTHER): Payer: Self-pay

## 2023-06-12 ENCOUNTER — Other Ambulatory Visit: Payer: Self-pay | Admitting: Physician Assistant

## 2023-06-13 ENCOUNTER — Other Ambulatory Visit: Payer: Self-pay

## 2023-06-13 ENCOUNTER — Other Ambulatory Visit (HOSPITAL_BASED_OUTPATIENT_CLINIC_OR_DEPARTMENT_OTHER): Payer: Self-pay

## 2023-06-13 MED ORDER — BUSPIRONE HCL 15 MG PO TABS
15.0000 mg | ORAL_TABLET | Freq: Two times a day (BID) | ORAL | 0 refills | Status: DC | PRN
  Filled 2023-06-13: qty 60, 30d supply, fill #0

## 2023-06-13 NOTE — Telephone Encounter (Signed)
Pt requesting refill for Adderall XR 30 mg. Last OV 11/2022.

## 2023-06-14 ENCOUNTER — Other Ambulatory Visit: Payer: Self-pay | Admitting: Physician Assistant

## 2023-06-15 ENCOUNTER — Other Ambulatory Visit (HOSPITAL_BASED_OUTPATIENT_CLINIC_OR_DEPARTMENT_OTHER): Payer: Self-pay

## 2023-06-29 ENCOUNTER — Other Ambulatory Visit (HOSPITAL_BASED_OUTPATIENT_CLINIC_OR_DEPARTMENT_OTHER): Payer: Self-pay

## 2023-07-12 ENCOUNTER — Encounter: Payer: Self-pay | Admitting: Physician Assistant

## 2023-07-12 ENCOUNTER — Other Ambulatory Visit (HOSPITAL_BASED_OUTPATIENT_CLINIC_OR_DEPARTMENT_OTHER): Payer: Self-pay

## 2023-07-12 ENCOUNTER — Other Ambulatory Visit: Payer: Self-pay | Admitting: Physician Assistant

## 2023-07-12 MED ORDER — LOSARTAN POTASSIUM 100 MG PO TABS
100.0000 mg | ORAL_TABLET | Freq: Every day | ORAL | 0 refills | Status: DC
  Filled 2023-07-12: qty 30, 30d supply, fill #0
  Filled 2023-08-10: qty 60, 60d supply, fill #1
  Filled 2023-08-17: qty 30, 30d supply, fill #1
  Filled 2023-09-04: qty 30, 30d supply, fill #2

## 2023-07-12 NOTE — Telephone Encounter (Signed)
Pt requesting refill for Adderall XR 30 mg. Last OV 11/2022.

## 2023-07-12 NOTE — Telephone Encounter (Signed)
Rx refused for Adderall. Pt needs an appt.

## 2023-07-13 ENCOUNTER — Other Ambulatory Visit (HOSPITAL_BASED_OUTPATIENT_CLINIC_OR_DEPARTMENT_OTHER): Payer: Self-pay

## 2023-07-13 ENCOUNTER — Other Ambulatory Visit: Payer: Self-pay | Admitting: Physician Assistant

## 2023-07-13 ENCOUNTER — Encounter (HOSPITAL_BASED_OUTPATIENT_CLINIC_OR_DEPARTMENT_OTHER): Payer: Self-pay

## 2023-07-14 NOTE — Telephone Encounter (Signed)
Rx for Adderall was refused.

## 2023-07-18 ENCOUNTER — Other Ambulatory Visit (HOSPITAL_BASED_OUTPATIENT_CLINIC_OR_DEPARTMENT_OTHER): Payer: Self-pay

## 2023-07-18 ENCOUNTER — Encounter: Payer: Self-pay | Admitting: Physician Assistant

## 2023-07-18 ENCOUNTER — Encounter (HOSPITAL_BASED_OUTPATIENT_CLINIC_OR_DEPARTMENT_OTHER): Payer: Self-pay

## 2023-07-18 ENCOUNTER — Ambulatory Visit (INDEPENDENT_AMBULATORY_CARE_PROVIDER_SITE_OTHER): Payer: Managed Care, Other (non HMO) | Admitting: Physician Assistant

## 2023-07-18 ENCOUNTER — Other Ambulatory Visit (HOSPITAL_COMMUNITY)
Admission: RE | Admit: 2023-07-18 | Discharge: 2023-07-18 | Disposition: A | Discharge status: Home / Self Care | Payer: Managed Care, Other (non HMO) | Source: Ambulatory Visit | Attending: Physician Assistant | Admitting: Physician Assistant

## 2023-07-18 VITALS — BP 160/90 | HR 77 | Ht 64.0 in | Wt 187.4 lb

## 2023-07-18 DIAGNOSIS — F909 Attention-deficit hyperactivity disorder, unspecified type: Secondary | ICD-10-CM

## 2023-07-18 DIAGNOSIS — Z124 Encounter for screening for malignant neoplasm of cervix: Secondary | ICD-10-CM | POA: Diagnosis present

## 2023-07-18 DIAGNOSIS — I10 Essential (primary) hypertension: Secondary | ICD-10-CM

## 2023-07-18 DIAGNOSIS — E669 Obesity, unspecified: Secondary | ICD-10-CM

## 2023-07-18 DIAGNOSIS — Z0000 Encounter for general adult medical examination without abnormal findings: Secondary | ICD-10-CM

## 2023-07-18 DIAGNOSIS — Z720 Tobacco use: Secondary | ICD-10-CM | POA: Diagnosis not present

## 2023-07-18 DIAGNOSIS — F341 Dysthymic disorder: Secondary | ICD-10-CM

## 2023-07-18 DIAGNOSIS — Z1159 Encounter for screening for other viral diseases: Secondary | ICD-10-CM

## 2023-07-18 DIAGNOSIS — Z23 Encounter for immunization: Secondary | ICD-10-CM

## 2023-07-18 DIAGNOSIS — Z6832 Body mass index (BMI) 32.0-32.9, adult: Secondary | ICD-10-CM

## 2023-07-18 DIAGNOSIS — E782 Mixed hyperlipidemia: Secondary | ICD-10-CM | POA: Diagnosis not present

## 2023-07-18 DIAGNOSIS — Z0001 Encounter for general adult medical examination with abnormal findings: Secondary | ICD-10-CM | POA: Diagnosis not present

## 2023-07-18 LAB — COMPREHENSIVE METABOLIC PANEL
ALT: 18 U/L (ref 0–35)
AST: 15 U/L (ref 0–37)
Albumin: 4.6 g/dL (ref 3.5–5.2)
Alkaline Phosphatase: 78 U/L (ref 39–117)
BUN: 16 mg/dL (ref 6–23)
CO2: 29 meq/L (ref 19–32)
Calcium: 9.9 mg/dL (ref 8.4–10.5)
Chloride: 104 meq/L (ref 96–112)
Creatinine, Ser: 1.1 mg/dL (ref 0.40–1.20)
GFR: 53.39 mL/min — ABNORMAL LOW (ref >60.00)
Glucose, Bld: 72 mg/dL (ref 70–99)
Potassium: 4 meq/L (ref 3.5–5.1)
Sodium: 141 meq/L (ref 135–145)
Total Bilirubin: 0.4 mg/dL (ref 0.2–1.2)
Total Protein: 7.5 g/dL (ref 6.0–8.3)

## 2023-07-18 LAB — CBC WITH DIFFERENTIAL/PLATELET
Basophils Absolute: 0 10*3/uL (ref 0.0–0.1)
Basophils Relative: 0.6 % (ref 0.0–3.0)
Eosinophils Absolute: 0.1 10*3/uL (ref 0.0–0.7)
Eosinophils Relative: 1.2 % (ref 0.0–5.0)
HCT: 43.3 % (ref 36.0–46.0)
Hemoglobin: 14.3 g/dL (ref 12.0–15.0)
Lymphocytes Relative: 31.1 % (ref 12.0–46.0)
Lymphs Abs: 1.8 10*3/uL (ref 0.7–4.0)
MCHC: 33.1 g/dL (ref 30.0–36.0)
MCV: 94.6 fL (ref 78.0–100.0)
Monocytes Absolute: 0.5 10*3/uL (ref 0.1–1.0)
Monocytes Relative: 7.8 % (ref 3.0–12.0)
Neutro Abs: 3.5 10*3/uL (ref 1.4–7.7)
Neutrophils Relative %: 59.3 % (ref 43.0–77.0)
Platelets: 272 10*3/uL (ref 150.0–400.0)
RBC: 4.57 Mil/uL (ref 3.87–5.11)
RDW: 13.9 % (ref 11.5–15.5)
WBC: 5.9 10*3/uL (ref 4.0–10.5)

## 2023-07-18 LAB — LIPID PANEL
Cholesterol: 314 mg/dL — ABNORMAL HIGH (ref 0–200)
HDL: 52.7 mg/dL (ref >39.00)
Total CHOL/HDL Ratio: 6
Triglycerides: 557 mg/dL — ABNORMAL HIGH (ref 0.0–149.0)

## 2023-07-18 LAB — LDL CHOLESTEROL, DIRECT: Direct LDL: 164 mg/dL

## 2023-07-18 MED ORDER — AMPHETAMINE-DEXTROAMPHET ER 15 MG PO CP24
15.0000 mg | ORAL_CAPSULE | ORAL | 0 refills | Status: DC
  Filled 2023-07-18: qty 30, 30d supply, fill #0

## 2023-07-18 MED ORDER — WEGOVY 0.25 MG/0.5ML ~~LOC~~ SOAJ
0.2500 mg | SUBCUTANEOUS | 1 refills | Status: DC
  Filled 2023-07-18: qty 2, 28d supply, fill #0
  Filled 2023-08-30: qty 2, 28d supply, fill #1

## 2023-07-18 NOTE — Progress Notes (Signed)
Christina Cowan is a 62 y.o. female and is here for a comprehensive physical exam.  HPI Health Maintenance Due  Topic Date Due   Hepatitis C Screening  Never done   Fecal DNA (Cologuard)  Never done   Cervical Cancer Screening (HPV/Pap Cotest)  07/14/2015   Acute Concerns: None.  Chronic Issues: Hypertension: Managed/Compliant with 100 mg Losartan. Monitored at home and with good readings. Denies excessive caffeine intake, stimulant usage, excessive alcohol intake.  Denies chest pain, shortness of breath, blurred vision, dizziness, unusual headaches, or lower leg swelling.   BP Readings from Last 3 Encounters:  07/18/23 (!) 160/90  02/09/23 (!) 168/86  02/09/23 (!) 150/90   Hyperlipidemia: Managed/Compliant with 80 mg Atorvastatin and 10 mg Zetia.  Lab Results  Component Value Date   CHOL 281 (H) 04/14/2022   HDL 55.50 04/14/2022   LDLCALC  04/08/2020     Comment:     . LDL cholesterol not calculated. Triglyceride levels greater than 400 mg/dL invalidate calculated LDL results. . Reference range: <100 . Desirable range <100 mg/dL for primary prevention;   <70 mg/dL for patients with CHD or diabetic patients  with > or = 2 CHD risk factors. Marland Kitchen LDL-C is now calculated using the Martin-Hopkins  calculation, which is a validated novel method providing  better accuracy than the Friedewald equation in the  estimation of LDL-C.  Horald Pollen et al. Lenox Ahr. 1610;960(45): 2061-2068  (http://education.[facility].com/faq/FAQ164)    LDLDIRECT 140.0 04/14/2022   TRIG (H) 04/14/2022    579.0 Triglyceride is over 400; calculations on Lipids are invalid.   CHOLHDL 5 04/14/2022   Anxiety/Depression; ADHD: Anxiety/depression managed with 300 mg Effexor-XR daily, 15 mg Buspar, and 25 mg Hydroxyzine.   ADHD managed with 30 mg Adderall-XR daily.   Denies SI/HI.  Health Maintenance: Immunizations -- utd Colonoscopy -- Never done. Defers/declines recommendations today, and has full  decision making capacity. Mammogram -- Overdue since 2010. Defers/declines recommendations today, and has full decision making capacity.  PAP -- Overdue since 2016. Done today in-office. Denies vaginal bleeding.  Bone Density -- N/A Diet -- Tries to eat as healthy as possible. Exercise -- Regular exercise: maintaining cleanliness of her house and her father's house.   Sleep habits -- Occasional difficulty sleeping Mood -- Recently stressed, otherwise stable  UTD with dentist? - Yes UTD with eye doctor? - Yes  Weight history: Wt Readings from Last 10 Encounters:  07/18/23 187 lb 6.1 oz (85 kg)  02/09/23 183 lb (83 kg)  02/09/23 184 lb 12.8 oz (83.8 kg)  12/27/22 185 lb (83.9 kg)  09/16/22 179 lb 4 oz (81.3 kg)  04/29/22 175 lb (79.4 kg)  03/31/22 180 lb (81.6 kg)  11/03/21 177 lb (80.3 kg)  04/06/21 172 lb 4 oz (78.1 kg)  08/18/20 177 lb 9.6 oz (80.6 kg)   Body mass index is 32.16 kg/m. Patient's last menstrual period was 01/06/2011.  Alcohol use:  reports current alcohol use of about 4.0 standard drinks of alcohol per week.  Tobacco use:  Tobacco Use: High Risk (07/18/2023)   Patient History    Smoking Tobacco Use: Every Day    Smokeless Tobacco Use: Never    Passive Exposure: Not on file   Eligible for lung cancer screening? No     07/18/2023    1:14 PM  Depression screen PHQ 2/9  Decreased Interest 0  Down, Depressed, Hopeless 0  PHQ - 2 Score 0  Altered sleeping 1  Tired, decreased energy 1  Change  in appetite 0  Feeling bad or failure about yourself  0  Trouble concentrating 0  Moving slowly or fidgety/restless 0  Suicidal thoughts 0  PHQ-9 Score 2  Difficult doing work/chores Somewhat difficult   Other providers/specialists: Patient Care Team: [name], [place] as PCP - General (Physician Assistant)   PMHx, SurgHx, SocialHx, Medications, and Allergies were reviewed in the Visit Navigator and updated as appropriate.   Past Medical History:   Diagnosis Date   Anxiety    Arthritis    Asthma    Essential hypertension, benign 06/17/2014   GERD (gastroesophageal reflux disease)    Pure hypercholesterolemia 06/17/2014    Past Surgical History:  Procedure Laterality Date   CESAREAN SECTION     Family History  Problem Relation Age of Onset   Arthritis Mother    Hypertension Mother    Arthritis Father    Colon cancer Paternal Uncle    Social History   Tobacco Use   Smoking status: Every Day    Current packs/day: 0.40    Average packs/day: 0.4 packs/day for 16.0 years (6.4 ttl pk-yrs)    Types: Cigarettes   Smokeless tobacco: Never  Substance Use Topics   Alcohol use: Yes    Alcohol/week: 4.0 standard drinks of alcohol    Types: 4 Glasses of wine per week    Comment: occ   Drug use: Yes    Types: Hydrocodone   Review of Systems:   Review of Systems  Constitutional:  Negative for chills, fever, malaise/fatigue and weight loss.  HENT:  Negative for hearing loss, sinus pain and sore throat.   Respiratory:  Negative for cough and hemoptysis.   Cardiovascular:  Negative for chest pain, palpitations, leg swelling and PND.  Gastrointestinal:  Negative for abdominal pain, constipation, diarrhea, heartburn, nausea and vomiting.  Genitourinary:  Negative for dysuria, frequency and urgency.  Musculoskeletal:  Negative for back pain, myalgias and neck pain.  Skin:  Negative for itching and rash.  Neurological:  Negative for dizziness, tingling, seizures and headaches.  Endo/Heme/Allergies:  Negative for polydipsia.  Psychiatric/Behavioral:  Negative for depression. The patient is not nervous/anxious.        Objective:   BP (!) 160/90 (BP Location: Left Arm, Patient Position: Sitting, Cuff Size: Large)   Pulse 77   Ht 5\' 4"  (1.626 m)   Wt 187 lb 6.1 oz (85 kg)   LMP 01/06/2011   SpO2 96%   BMI 32.16 kg/m  Body mass index is 32.16 kg/m.   General Appearance:    Alert, cooperative, no distress, appears stated  age  Head:    Normocephalic, without obvious abnormality, atraumatic  Eyes:    PERRL, conjunctiva/corneas clear, EOM's intact, fundi    benign, both eyes  Ears:    Normal TM's and external ear canals, both ears  Nose:   Nares normal, septum midline, mucosa normal, no drainage    or sinus tenderness  Throat:   Lips, mucosa, and tongue normal; teeth and gums normal  Neck:   Supple, symmetrical, trachea midline, no adenopathy;    thyroid:  no enlargement/tenderness/nodules; no carotid   bruit or JVD  Back:     Symmetric, no curvature, ROM normal, no CVA tenderness  Lungs:     Clear to auscultation bilaterally, respirations unlabored  Chest Wall:    No tenderness or deformity   Heart:    Regular rate and rhythm, S1 and S2 normal, no murmur, rub or gallop  Breast Exam:  Deferred  Abdomen:     Soft, non-tender, bowel sounds active all four quadrants,    no masses, no organomegaly  Genitalia:    Normal female without lesion, discharge or tenderness  Extremities:   Extremities normal, atraumatic, no cyanosis or edema  Pulses:   2+ and symmetric all extremities  Skin:   Skin color, texture, turgor normal, no rashes or lesions  Lymph nodes:   Cervical, supraclavicular, and axillary nodes normal  Neurologic:   CNII-XII intact, normal strength, sensation and reflexes    throughout    Assessment/Plan:   Routine physical examination Today patient counseled on age appropriate routine health concerns for screening and prevention, each reviewed and up to date or declined. Immunizations reviewed and up to date or declined. Labs ordered and reviewed. Risk factors for depression reviewed and negative. Hearing function and visual acuity are intact. ADLs screened and addressed as needed. Functional ability and level of safety reviewed and appropriate. Education, counseling and referrals performed based on assessed risks today. Patient provided with a copy of personalized plan for preventive  services.  Encounter for screening for other viral diseases Update hepatitis C  Pap smear for cervical cancer screening Completed today  Flu vaccine need Completed today  Tobacco abuse Denies interest in quitting Does not meet criteria for Lung Cancer screening  Mixed hyperlipidemia Update lipid panel and adjust Lipitor 80 mg daily as indicated  Attention deficit hyperactivity disorder (ADHD), unspecified ADHD type Ongoing Discussed my concerns with uncontrolled hypertension and ongoing stimulant use Will refill lower dose of Adderall 20 mg XR x 1 month -- but she must send [place] blood pressure readings before next refill   Obesity, unspecified class, unspecified obesity type, unspecified whether serious comorbidity present Ongoing Discussed risks/benefits/side effect(s) of Wegovy Will start 0.25 mg weekly Wegovy Follow-up in 3 months, sooner if concerns  Essential Hypertension Above goal today No evidence of end-organ damage on my exam Recommend patient monitor home blood pressure at least a few times weekly Continue losartan 100 mg daily If home monitoring shows consistent elevation, or any symptom(s) develop, recommend reach out to [place] for further advice on next steps Follow-up in 3 months.  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  I, [name], [place], have reviewed all documentation for this visit. The documentation on 07/18/23 for the exam, diagnosis, procedures, and orders are all accurate and complete.  [name], PA-C  [facility]

## 2023-07-18 NOTE — Patient Instructions (Addendum)
It was great to see you!  Follow-up in 3 months  Please go to the lab for blood work.   Our office will call you with your results unless you have chosen to receive results via MyChart.  If your blood work is normal we will follow-up each year for physicals and as scheduled for chronic medical problems.  If anything is abnormal we will treat accordingly and get you in for a follow-up.  Take care,  [name]

## 2023-07-19 ENCOUNTER — Other Ambulatory Visit (HOSPITAL_BASED_OUTPATIENT_CLINIC_OR_DEPARTMENT_OTHER): Payer: Self-pay

## 2023-07-20 LAB — HEPATITIS C ANTIBODY: Hepatitis C Ab: NONREACTIVE

## 2023-07-21 ENCOUNTER — Other Ambulatory Visit (HOSPITAL_COMMUNITY): Payer: Self-pay

## 2023-07-21 ENCOUNTER — Telehealth: Payer: Self-pay

## 2023-07-21 ENCOUNTER — Other Ambulatory Visit (HOSPITAL_BASED_OUTPATIENT_CLINIC_OR_DEPARTMENT_OTHER): Payer: Self-pay

## 2023-07-21 ENCOUNTER — Other Ambulatory Visit: Payer: Self-pay | Admitting: Physician Assistant

## 2023-07-21 DIAGNOSIS — E782 Mixed hyperlipidemia: Secondary | ICD-10-CM

## 2023-07-21 LAB — CYTOLOGY - PAP
Comment: NEGATIVE
Diagnosis: NEGATIVE
High risk HPV: NEGATIVE

## 2023-07-21 NOTE — Telephone Encounter (Addendum)
Pharmacy Patient Advocate Encounter   Received notification from CoverMyMeds that prior authorization for [facility] 0.25MG /0.5ML auto-injectors is required/requested.   Insurance verification completed.   The patient is insured through [employer] .   Per test claim: CANCELLED due to Per ins: Your patient will pay 100% of a discounted price for this medication. Amy amount the patient pays will not apply to their deductible or out-of-pocket expenses.   KEY [id number]

## 2023-07-24 ENCOUNTER — Encounter: Payer: Self-pay | Admitting: *Deleted

## 2023-07-24 ENCOUNTER — Other Ambulatory Visit (HOSPITAL_BASED_OUTPATIENT_CLINIC_OR_DEPARTMENT_OTHER): Payer: Self-pay

## 2023-07-24 ENCOUNTER — Encounter: Payer: Self-pay | Admitting: Physician Assistant

## 2023-07-24 DIAGNOSIS — F341 Dysthymic disorder: Secondary | ICD-10-CM

## 2023-07-24 MED ORDER — VENLAFAXINE HCL ER 150 MG PO CP24
300.0000 mg | ORAL_CAPSULE | Freq: Every day | ORAL | 1 refills | Status: DC
Start: 2023-07-24 — End: 2024-01-27
  Filled 2023-07-24: qty 180, 90d supply, fill #0
  Filled 2023-10-22 – 2023-11-06 (×2): qty 180, 90d supply, fill #1

## 2023-07-24 NOTE — Progress Notes (Signed)
Message sent to pt thru MyChart.

## 2023-07-25 ENCOUNTER — Telehealth: Payer: Self-pay

## 2023-07-25 ENCOUNTER — Telehealth: Payer: Self-pay | Admitting: Physician Assistant

## 2023-07-25 ENCOUNTER — Other Ambulatory Visit (HOSPITAL_COMMUNITY): Payer: Self-pay

## 2023-07-25 NOTE — Telephone Encounter (Signed)
Pharmacy Patient Advocate Encounter   Received notification from Pt Calls Messages that prior authorization for [facility] is required/requested.   Insurance verification completed.   The patient is insured through [facility] .   Per test claim: PA required; PA submitted to above mentioned insurance via Phone Key/confirmation #/[id number] Status is pending

## 2023-07-25 NOTE — Telephone Encounter (Signed)
Spoke to pt told her PA for [facility],The was denied. Told her looks like insurance does not cover it at all. Per ins: Your patient will pay 100% of a discounted price for this medication. Amy amount the patient pays will not apply to their deductible or out-of-pocket expenses. Pt verbalized understanding.

## 2023-07-25 NOTE — Telephone Encounter (Signed)
Patient returned call. Requests to be called.

## 2023-07-25 NOTE — Telephone Encounter (Signed)
See other message

## 2023-07-25 NOTE — Telephone Encounter (Signed)
Please run PA for Wegovy 0.25 mg again under her Medicaid they should cover it. It was ran under her [place]. Please send office notes.

## 2023-07-25 NOTE — Telephone Encounter (Signed)
Left message on voicemail to call office.  

## 2023-07-26 NOTE — Telephone Encounter (Signed)
Pharmacy Patient Advocate Encounter  Received notification from [facility] that Prior Authorization for Wegovy 0.25mg /0.72ml has been APPROVED from 07/25/23 to 01/21/24   Approval letter indexed to media tab

## 2023-07-29 ENCOUNTER — Other Ambulatory Visit (HOSPITAL_BASED_OUTPATIENT_CLINIC_OR_DEPARTMENT_OTHER): Payer: Self-pay

## 2023-07-31 ENCOUNTER — Telehealth: Payer: Self-pay | Admitting: Physician Assistant

## 2023-07-31 NOTE — Telephone Encounter (Signed)
Noted  

## 2023-07-31 NOTE — Telephone Encounter (Signed)
Spoke to pt told her PA was done again thru [facility] and was approved for 6 months. Pt verbalized understanding. Told her to contact the pharmacy and let them know it was approved and to run it again. Pt verbalized understanding.

## 2023-07-31 NOTE — Telephone Encounter (Signed)
Patient states she was returning donna's call.

## 2023-07-31 NOTE — Telephone Encounter (Signed)
Left message on voicemail to call office.  

## 2023-07-31 NOTE — Telephone Encounter (Signed)
See other message

## 2023-08-01 ENCOUNTER — Other Ambulatory Visit (HOSPITAL_BASED_OUTPATIENT_CLINIC_OR_DEPARTMENT_OTHER): Payer: Self-pay

## 2023-08-10 ENCOUNTER — Ambulatory Visit: Payer: Commercial Managed Care - HMO | Admitting: Dermatology

## 2023-08-14 ENCOUNTER — Other Ambulatory Visit: Payer: Self-pay | Admitting: Physician Assistant

## 2023-08-15 ENCOUNTER — Other Ambulatory Visit (HOSPITAL_BASED_OUTPATIENT_CLINIC_OR_DEPARTMENT_OTHER): Payer: Self-pay

## 2023-08-15 ENCOUNTER — Encounter: Payer: Self-pay | Admitting: Physician Assistant

## 2023-08-15 MED ORDER — AMPHETAMINE-DEXTROAMPHET ER 15 MG PO CP24
15.0000 mg | ORAL_CAPSULE | ORAL | 0 refills | Status: DC
  Filled 2023-08-15: qty 30, 30d supply, fill #0

## 2023-08-15 NOTE — Telephone Encounter (Signed)
Pt requesting refill for Adderall XR 15 mg. Last OV 07/18/2023.

## 2023-08-17 ENCOUNTER — Other Ambulatory Visit (HOSPITAL_BASED_OUTPATIENT_CLINIC_OR_DEPARTMENT_OTHER): Payer: Self-pay

## 2023-08-18 ENCOUNTER — Other Ambulatory Visit: Payer: Self-pay

## 2023-08-31 ENCOUNTER — Other Ambulatory Visit (HOSPITAL_BASED_OUTPATIENT_CLINIC_OR_DEPARTMENT_OTHER): Payer: Self-pay

## 2023-09-04 ENCOUNTER — Other Ambulatory Visit (HOSPITAL_BASED_OUTPATIENT_CLINIC_OR_DEPARTMENT_OTHER): Payer: Self-pay

## 2023-09-13 ENCOUNTER — Other Ambulatory Visit: Payer: Self-pay | Admitting: Physician Assistant

## 2023-09-14 NOTE — Telephone Encounter (Signed)
Last OV: 07/18/23  Next OV: None  Last Filled: 08/15/23  Quantity: 30

## 2023-09-14 NOTE — Telephone Encounter (Signed)
Patient was instructed to send [place] home blood pressure readings prior to next Adderall refill - please inform her that I will not fill this until we get updated home readings OR after she has seen cardiology

## 2023-09-15 ENCOUNTER — Encounter: Payer: Self-pay | Admitting: *Deleted

## 2023-09-15 ENCOUNTER — Other Ambulatory Visit (HOSPITAL_BASED_OUTPATIENT_CLINIC_OR_DEPARTMENT_OTHER): Payer: Self-pay

## 2023-09-15 ENCOUNTER — Other Ambulatory Visit: Payer: Self-pay | Admitting: Physician Assistant

## 2023-09-15 MED ORDER — AMPHETAMINE-DEXTROAMPHET ER 15 MG PO CP24
15.0000 mg | ORAL_CAPSULE | ORAL | 0 refills | Status: DC
  Filled 2023-09-15: qty 30, 30d supply, fill #0

## 2023-09-15 NOTE — Telephone Encounter (Signed)
Please see message from patient

## 2023-09-15 NOTE — Telephone Encounter (Signed)
Copied from CRM [id number]. Topic: General - Other >> Sep 15, 2023 10:31 AM Theodis Sato wrote: Reason for CRM:  Patient was unable to access MyChart and would like the blood pressure readings from the last 2 weeks relayed to [facility]    1/6-1/12 Monday AM 171 over 91   //PM 159 over 92 Tuesday AM 173 over 89  // PM 120 over 85 Wednesday AM 128 over 81 // PM 134 over 95 Thursday AM 141 over 77 //PM 136 over 83 Friday AM 132 over 89 //PM 137 over 80 Saturday AM 155 over 83 // PM 147 over 86 Sunday AM 145 over 75 //PM 141 over 85   1/13-1/17 Monday AM 137 over 85 //PM 128 over 77 Tuesday AM 131 over 87 //PM 137 over 79 Wednesday AM  140 over 90 //PM 142 over 88 Thursday AM 134 over 88  //PM 138 over 81 Friday AM  129 over 84

## 2023-09-21 ENCOUNTER — Other Ambulatory Visit (HOSPITAL_BASED_OUTPATIENT_CLINIC_OR_DEPARTMENT_OTHER): Payer: Self-pay | Admitting: Physician Assistant

## 2023-09-21 DIAGNOSIS — Z1231 Encounter for screening mammogram for malignant neoplasm of breast: Secondary | ICD-10-CM

## 2023-09-22 ENCOUNTER — Other Ambulatory Visit (HOSPITAL_BASED_OUTPATIENT_CLINIC_OR_DEPARTMENT_OTHER): Payer: Self-pay

## 2023-09-22 ENCOUNTER — Other Ambulatory Visit: Payer: Self-pay

## 2023-09-22 MED ORDER — MELOXICAM 15 MG PO TABS
15.0000 mg | ORAL_TABLET | Freq: Every day | ORAL | 1 refills | Status: DC
  Filled 2023-09-22: qty 30, 30d supply, fill #0
  Filled 2023-10-22 – 2023-11-30 (×2): qty 30, 30d supply, fill #1

## 2023-09-26 ENCOUNTER — Ambulatory Visit (HOSPITAL_BASED_OUTPATIENT_CLINIC_OR_DEPARTMENT_OTHER)
Admission: RE | Admit: 2023-09-26 | Discharge: 2023-09-26 | Disposition: A | Discharge status: Home / Self Care | Payer: Commercial Managed Care - HMO | Source: Ambulatory Visit | Attending: Physician Assistant | Admitting: Physician Assistant

## 2023-09-26 ENCOUNTER — Encounter (HOSPITAL_BASED_OUTPATIENT_CLINIC_OR_DEPARTMENT_OTHER): Payer: Self-pay | Admitting: Radiology

## 2023-09-26 DIAGNOSIS — Z1231 Encounter for screening mammogram for malignant neoplasm of breast: Secondary | ICD-10-CM | POA: Insufficient documentation

## 2023-09-27 ENCOUNTER — Ambulatory Visit: Payer: Managed Care, Other (non HMO) | Admitting: Cardiology

## 2023-10-04 ENCOUNTER — Other Ambulatory Visit: Payer: Self-pay | Admitting: Physician Assistant

## 2023-10-05 ENCOUNTER — Other Ambulatory Visit (HOSPITAL_BASED_OUTPATIENT_CLINIC_OR_DEPARTMENT_OTHER): Payer: Self-pay

## 2023-10-05 ENCOUNTER — Telehealth: Payer: Self-pay | Admitting: *Deleted

## 2023-10-05 MED ORDER — WEGOVY 0.5 MG/0.5ML ~~LOC~~ SOAJ
0.5000 mg | SUBCUTANEOUS | 0 refills | Status: DC
  Filled 2023-10-05: qty 2, 28d supply, fill #0

## 2023-10-05 MED ORDER — BUSPIRONE HCL 15 MG PO TABS
15.0000 mg | ORAL_TABLET | Freq: Two times a day (BID) | ORAL | 2 refills | Status: AC | PRN
  Filled 2023-10-05: qty 60, 30d supply, fill #0
  Filled 2024-01-27: qty 60, 30d supply, fill #1
  Filled 2024-08-05: qty 60, 30d supply, fill #2

## 2023-10-05 MED ORDER — LOSARTAN POTASSIUM 100 MG PO TABS
100.0000 mg | ORAL_TABLET | Freq: Every day | ORAL | 1 refills | Status: DC
  Filled 2023-10-05: qty 90, 90d supply, fill #0
  Filled 2024-01-08: qty 90, 90d supply, fill #1

## 2023-10-05 NOTE — Telephone Encounter (Signed)
 Spoke to pt about Rx refill for Wegovy .

## 2023-10-05 NOTE — Telephone Encounter (Signed)
 Copied from CRM [id number]. Topic: General - Call Back - No Documentation >> Oct 05, 2023  8:28 AM Leotis ORN wrote: Reason for CRM: patient returning missed call, could not find any notes to determine what it is in regards too or who called. Patient callback [phone]

## 2023-10-05 NOTE — Telephone Encounter (Signed)
 Left message on voicemail to call office.

## 2023-10-16 ENCOUNTER — Encounter: Payer: Self-pay | Admitting: Physician Assistant

## 2023-10-16 ENCOUNTER — Other Ambulatory Visit: Payer: Self-pay | Admitting: Physician Assistant

## 2023-10-16 NOTE — Telephone Encounter (Signed)
Pt requesting Adderall XR 15 mg. Last OV 06/2023.

## 2023-10-18 ENCOUNTER — Other Ambulatory Visit: Payer: Self-pay | Admitting: Physician Assistant

## 2023-10-18 ENCOUNTER — Other Ambulatory Visit (HOSPITAL_BASED_OUTPATIENT_CLINIC_OR_DEPARTMENT_OTHER): Payer: Self-pay

## 2023-10-18 MED ORDER — AMPHETAMINE-DEXTROAMPHET ER 15 MG PO CP24
15.0000 mg | ORAL_CAPSULE | ORAL | 0 refills | Status: DC
  Filled 2023-10-18: qty 30, 30d supply, fill #0

## 2023-10-23 ENCOUNTER — Other Ambulatory Visit: Payer: Self-pay

## 2023-11-02 ENCOUNTER — Other Ambulatory Visit (HOSPITAL_BASED_OUTPATIENT_CLINIC_OR_DEPARTMENT_OTHER): Payer: Self-pay

## 2023-11-05 ENCOUNTER — Other Ambulatory Visit: Payer: Self-pay | Admitting: Physician Assistant

## 2023-11-06 ENCOUNTER — Other Ambulatory Visit (HOSPITAL_BASED_OUTPATIENT_CLINIC_OR_DEPARTMENT_OTHER): Payer: Self-pay

## 2023-11-06 MED ORDER — WEGOVY 1 MG/0.5ML ~~LOC~~ SOAJ
1.0000 mg | SUBCUTANEOUS | 0 refills | Status: DC
  Filled 2023-11-06: qty 2, 28d supply, fill #0

## 2023-11-07 ENCOUNTER — Other Ambulatory Visit (HOSPITAL_BASED_OUTPATIENT_CLINIC_OR_DEPARTMENT_OTHER): Payer: Self-pay

## 2023-11-08 ENCOUNTER — Encounter (HOSPITAL_BASED_OUTPATIENT_CLINIC_OR_DEPARTMENT_OTHER): Payer: Self-pay

## 2023-11-08 ENCOUNTER — Other Ambulatory Visit (HOSPITAL_BASED_OUTPATIENT_CLINIC_OR_DEPARTMENT_OTHER): Payer: Self-pay

## 2023-11-15 ENCOUNTER — Other Ambulatory Visit: Payer: Self-pay | Admitting: Physician Assistant

## 2023-11-15 ENCOUNTER — Encounter: Payer: Self-pay | Admitting: Physician Assistant

## 2023-11-16 NOTE — Telephone Encounter (Signed)
 Pt requesting refill for Adderall XR 15 mg. Last OV 07/18/2023.

## 2023-11-18 ENCOUNTER — Other Ambulatory Visit (HOSPITAL_BASED_OUTPATIENT_CLINIC_OR_DEPARTMENT_OTHER): Payer: Self-pay

## 2023-11-18 ENCOUNTER — Encounter (HOSPITAL_BASED_OUTPATIENT_CLINIC_OR_DEPARTMENT_OTHER): Payer: Self-pay

## 2023-11-23 ENCOUNTER — Ambulatory Visit: Admitting: Physician Assistant

## 2023-11-30 ENCOUNTER — Other Ambulatory Visit: Payer: Self-pay | Admitting: Physician Assistant

## 2023-12-01 ENCOUNTER — Other Ambulatory Visit (HOSPITAL_BASED_OUTPATIENT_CLINIC_OR_DEPARTMENT_OTHER): Payer: Self-pay

## 2023-12-01 ENCOUNTER — Other Ambulatory Visit: Payer: Self-pay

## 2023-12-01 MED ORDER — WEGOVY 1 MG/0.5ML ~~LOC~~ SOAJ
1.0000 mg | SUBCUTANEOUS | 0 refills | Status: DC
  Filled 2023-12-01: qty 2, 28d supply, fill #0

## 2023-12-12 ENCOUNTER — Encounter: Payer: Self-pay | Admitting: Physician Assistant

## 2023-12-13 ENCOUNTER — Ambulatory Visit (INDEPENDENT_AMBULATORY_CARE_PROVIDER_SITE_OTHER): Admitting: Family

## 2023-12-13 ENCOUNTER — Ambulatory Visit: Payer: Self-pay

## 2023-12-13 ENCOUNTER — Encounter: Payer: Self-pay | Admitting: Family

## 2023-12-13 VITALS — BP 167/94 | HR 86 | Temp 97.7°F | Ht 64.0 in | Wt 177.5 lb

## 2023-12-13 DIAGNOSIS — I10 Essential (primary) hypertension: Secondary | ICD-10-CM

## 2023-12-13 DIAGNOSIS — J301 Allergic rhinitis due to pollen: Secondary | ICD-10-CM | POA: Diagnosis not present

## 2023-12-13 DIAGNOSIS — H938X3 Other specified disorders of ear, bilateral: Secondary | ICD-10-CM | POA: Diagnosis not present

## 2023-12-13 NOTE — Telephone Encounter (Signed)
 Copied from CRM [id number]. Topic: Clinical - Red Word Triage >> Dec 13, 2023  1:40 PM [place] A wrote: [facility] that prompted transfer to Nurse Triage: Patient is having ear pain in both ears and also  colored mucus from nose   Chief Complaint: Bilateral ear pain, runny nose Symptoms: Above Frequency: Sunday Pertinent Negatives: Patient denies fever Disposition: [] ED /[] Urgent Care (no appt availability in office) / [x] Appointment(In office/virtual)/ []  [facility]/ [] Home Care/ [] Refused Recommended Disposition /[] [place] [facility]/ []  Follow-up with PCP Additional Notes: Agrees with appointment.  Reason for Disposition  [1] SEVERE pain AND [2] not improved 2 hours after taking analgesic medication (e.g., ibuprofen or acetaminophen)  Answer Assessment - Initial Assessment Questions 1. LOCATION: "Which ear is involved?"     Both 2. ONSET: "When did the ear start hurting"      Sunday 3. SEVERITY: "How bad is the pain?"  (Scale 1-10; mild, moderate or severe)   - MILD (1-3): doesn't interfere with normal activities    - MODERATE (4-7): interferes with normal activities or awakens from sleep    - SEVERE (8-10): excruciating pain, unable to do any normal activities      Severe 4. URI SYMPTOMS: "Do you have a runny nose or cough?"     yes 5. FEVER: "Do you have a fever?" If Yes, ask: "What is your temperature, how was it measured, and when did it start?"     no 6. CAUSE: "Have you been swimming recently?", "How often do you use Q-TIPS?", "Have you had any recent air travel or scuba diving?"     no 7. OTHER SYMPTOMS: "Do you have any other symptoms?" (e.g., headache, stiff neck, dizziness, vomiting, runny nose, decreased hearing)     Runny nose 8. PREGNANCY: "Is there any chance you are pregnant?" "When was your last menstrual period?"     No  Protocols used: Louie Rover

## 2023-12-13 NOTE — Progress Notes (Signed)
 Patient ID: Christina Cowan, female    DOB: 11/07/1960, 63 y.o.   MRN: [id number]  Chief Complaint  Patient presents with   Ear Pain    Pt c/o bilateral ear pain, present for 4 days. Has tried alegra and tylenol  which did not help.   Discussed the use of AI scribe software for clinical note transcription with the patient, who gave verbal consent to proceed.  History of Present Illness The patient presents with bilateral ear pain and sinus drainage that started a few days ago. The ear pain radiates down the neck and is associated with a sensation of pressure. She took several Ibuprofen last evening which did provide some relief. She also reports a significant amount of sinus drainage, which she believes started from her allergies. The drainage has been bloody and brown in color. She denies any cough, but reports feeling congested and muffled in her ears. She has been taking Allegra almost daily for her allergies. She also reports a history of strep throat and sinus infections, but denies any recent symptoms of these conditions. She has not been around anyone who is sick. She also mentions having a dog hair in her ear, but denies any itching or discomfort from it.  Assessment & Plan Allergic Rhinitis Symptoms consistent with allergic rhinitis, exacerbated by inconsistent use of nasal steroids. No infection present. - Use Flonase twice daily for a few days, then reduce to once daily. - Continue Allegra as needed. - Use saline spray to clear nasal passages several times per day. - Avoid outdoor exposure to pollen.  HTN- Elevated BP, pt taking Losartan  100mg  qd, reports she has appt with Cardiology tomorrow to discuss. - Keep appt with Cardiology - Eat low sodium diet, drink 2L water qd, exercise daily, limit NSAIDs.  Ear Discomfort Ear pain and muffled sensation likely due to sinus congestion. Mild bilateral serous effusion. No infection. Dog hair in ear canal noted. - Rinse ears with water during  showers to remove dog hair. - Take Advil, up to three tablets twice daily, for pain relief for next 2 days only.  Fatigue Fatigue possibly related to allergic symptoms and sinus congestion. - Rest and monitor symptoms.     Subjective:    Outpatient Medications Prior to Visit  Medication Sig Dispense Refill   albuterol  (VENTOLIN  HFA) 108 (90 Base) MCG/ACT inhaler Inhale 2 puffs into the lungs every 6 (six) hours as needed for wheezing or shortness of breath. 18 g 1   amphetamine -dextroamphetamine  (ADDERALL XR) 15 MG 24 hr capsule Take 1 capsule by mouth every morning. 30 capsule 0   atorvastatin  (LIPITOR) 80 MG tablet Take 1 tablet (80 mg total) by mouth daily. 90 tablet 0   busPIRone  (BUSPAR ) 15 MG tablet Take 1 tablet (15 mg total) by mouth 2 (two) times daily as needed. 60 tablet 2   ezetimibe  (ZETIA ) 10 MG tablet Take 1 tablet (10 mg total) by mouth daily. 90 tablet 1   fexofenadine (ALLEGRA) 180 MG tablet Take 180 mg by mouth daily.     fluticasone (FLONASE) 50 MCG/ACT nasal spray Place 1 spray into both nostrils daily.     hydrOXYzine  (ATARAX ) 25 MG tablet Take 0.5-1 tablets (12.5-25 mg total) by mouth every 6 (six) hours as needed for itching. 45 tablet 0   losartan  (COZAAR ) 100 MG tablet Take 1 tablet (100 mg total) by mouth daily. 90 tablet 1   meloxicam  (MOBIC ) 15 MG tablet Take 1 tablet (15 mg total) by mouth daily.  30 tablet 1   Semaglutide -Weight Management (WEGOVY ) 1 MG/0.5ML SOAJ Inject 1 mg into the skin once a week. 2 mL 0   triamcinolone  ointment (KENALOG ) 0.1 % Apply 1 Application topically 3 (three) times daily. 454 g 1   venlafaxine  XR (EFFEXOR -XR) 150 MG 24 hr capsule Take 2 capsules (300 mg total) by mouth daily with breakfast. 180 capsule 1   No facility-administered medications prior to visit.   Past Medical History:  Diagnosis Date   Anxiety    Arthritis    Asthma    Essential hypertension, benign 06/17/2014   GERD (gastroesophageal reflux disease)    Pure  hypercholesterolemia 06/17/2014   Past Surgical History:  Procedure Laterality Date   CESAREAN SECTION     Allergies  Allergen Reactions   Prednisone  Other (See Comments)    Bad thoughts -- only with oral prednisone , can tolerate injection per patient 01/15/20      Objective:    Physical Exam Vitals and nursing note reviewed.  Constitutional:      Appearance: Normal appearance. She is obese.  HENT:     Right Ear: Ear canal normal. A middle ear effusion (mild) is present.     Left Ear: Ear canal normal. A middle ear effusion (mild) is present.     Nose: Congestion and rhinorrhea present. Rhinorrhea is clear.     Mouth/Throat:     Mouth: Mucous membranes are moist.     Pharynx: No pharyngeal swelling, oropharyngeal exudate, posterior oropharyngeal erythema, uvula swelling or postnasal drip.  Cardiovascular:     Rate and Rhythm: Normal rate and regular rhythm.  Pulmonary:     Effort: Pulmonary effort is normal.     Breath sounds: Normal breath sounds.  Musculoskeletal:        General: Normal range of motion.  Skin:    General: Skin is warm and dry.  Neurological:     Mental Status: She is alert.  Psychiatric:        Mood and Affect: Mood normal.        Behavior: Behavior normal.    BP (!) 167/94 (BP Location: Left Arm, Patient Position: Sitting, Cuff Size: Large)   Pulse 86   Temp 97.7 F (36.5 C) (Temporal)   Ht 5\' 4"  (1.626 m)   Wt 177 lb 8 oz (80.5 kg)   LMP 01/06/2011   SpO2 97%   BMI 30.47 kg/m  Wt Readings from Last 3 Encounters:  12/13/23 177 lb 8 oz (80.5 kg)  07/18/23 187 lb 6.1 oz (85 kg)  02/09/23 183 lb (83 kg)      Versa Gore, NP

## 2023-12-13 NOTE — Telephone Encounter (Signed)
 Noted.

## 2023-12-14 ENCOUNTER — Ambulatory Visit (INDEPENDENT_AMBULATORY_CARE_PROVIDER_SITE_OTHER): Admitting: Cardiology

## 2023-12-14 ENCOUNTER — Other Ambulatory Visit (HOSPITAL_BASED_OUTPATIENT_CLINIC_OR_DEPARTMENT_OTHER): Payer: Self-pay

## 2023-12-14 VITALS — BP 146/82 | HR 81 | Ht 64.0 in | Wt 177.7 lb

## 2023-12-14 DIAGNOSIS — E782 Mixed hyperlipidemia: Secondary | ICD-10-CM

## 2023-12-14 DIAGNOSIS — I10 Essential (primary) hypertension: Secondary | ICD-10-CM

## 2023-12-14 MED ORDER — AMLODIPINE BESYLATE 2.5 MG PO TABS
2.5000 mg | ORAL_TABLET | Freq: Every day | ORAL | 3 refills | Status: DC
  Filled 2023-12-14: qty 90, 90d supply, fill #0
  Filled 2024-03-06: qty 90, 90d supply, fill #1
  Filled 2024-06-02: qty 90, 90d supply, fill #2
  Filled 2024-09-09: qty 90, 90d supply, fill #3

## 2023-12-14 MED ORDER — ROSUVASTATIN CALCIUM 20 MG PO TABS
20.0000 mg | ORAL_TABLET | Freq: Every day | ORAL | 3 refills | Status: AC
  Filled 2023-12-14: qty 90, 90d supply, fill #0
  Filled 2024-03-06: qty 90, 90d supply, fill #1
  Filled 2024-06-02: qty 90, 90d supply, fill #2

## 2023-12-14 NOTE — Patient Instructions (Signed)
 Medication Instructions:  Please discontinue Atorvastatin and start Crestor 20 daily. Start Amlodipine 2.5 mg daily. Continue all other medications as listed.  *If you need a refill on your cardiac medications before your next appointment, please call your pharmacy*   Follow-Up: At [facility], you and your health needs are our priority.  As part of our continuing mission to provide you with exceptional heart care, our providers are all part of one team.  This team includes your primary Cardiologist (physician) and Advanced Practice Providers or APPs (Physician Assistants and Nurse Practitioners) who all work together to provide you with the care you need, when you need it.  Your next appointment:   1-2 month(s)  Provider:   Hypertension [facility]      We recommend signing up for the patient portal called "MyChart".  Sign up information is provided on this After Visit Summary.  MyChart is used to connect with patients for Virtual Visits (Telemedicine).  Patients are able to view lab/test results, encounter notes, upcoming appointments, etc.  Non-urgent messages can be sent to your provider as well.   To learn more about what you can do with MyChart, go to [email or web address].     1st Floor: - Lobby - Registration  - Pharmacy  - Lab - Cafe  2nd Floor: - PV Lab - Diagnostic Testing (echo, CT, nuclear med)  3rd Floor: - Vacant  4th Floor: - TCTS (cardiothoracic surgery) - AFib Clinic - Structural Heart Clinic - Vascular Surgery  - Vascular Ultrasound  5th Floor: - HeartCare Cardiology (general and EP) - Clinical Pharmacy for coumadin, hypertension, lipid, weight-loss medications, and med management appointments    Valet parking services will be available as well.

## 2023-12-14 NOTE — Progress Notes (Signed)
 Cardiology Office Note:  .   Date:  12/14/2023  ID:  [name], DOB 01-Apr-1961, MRN [id number] PCP: [name], PA  [facility] Providers Cardiologist:  None     History of Present Illness: .   Christina Cowan is a 63 y.o. female Discussed the use of AI scribe software for clinical note transcription with the patient, who gave verbal consent to proceed.  History of Present Illness Christina Cowan is a 63 year old female with hypertension and hyperlipidemia who presents for follow-up. She was referred by [name], PA, for follow-up on hypertension and hyperlipidemia.  Her blood pressure has been elevated recently, with readings ranging from the 160s to 140s over 80s to 90s. It is consistently high during doctor visits, but she also monitors it at home and sends results to [facility] Her current medication regimen includes losartan 100 mg daily.  Her primary provider recently put her Adderall on hold, which she finds challenging as she has been taking 15 mg daily. She wants to resume Adderall use.  Regarding hyperlipidemia, she is on Zetia 10 mg and atorvastatin 80 mg, although she finds the atorvastatin difficult to take due to its size and sometimes does not take it regularly. Her direct LDL was noted to be 164. She experiences side effects with atorvastatin, such as muscle aches.  She has a history of significant weight loss 20 years ago, losing about 60 pounds, which led to her being taken off blood pressure medication at that time. She is currently working on her weight and walking more, considering joining a local wellness center for additional support.  She mentions a past knee replacement surgery two years ago, after which she was diligent with her therapy and recovery, leading to a successful outcome. She is motivated to improve her overall health and has cut back on smoking.    Studies Reviewed: .        Results LABS Direct LDL: 164 mg/dL Risk  Assessment/Calculations:           Physical Exam:   VS:  BP (!) 146/82   Pulse 81   Ht 5\' 4"  (1.626 m)   Wt 177 lb 11.2 oz (80.6 kg)   LMP 01/06/2011   SpO2 100%   BMI 30.50 kg/m    Wt Readings from Last 3 Encounters:  12/14/23 177 lb 11.2 oz (80.6 kg)  12/13/23 177 lb 8 oz (80.5 kg)  07/18/23 187 lb 6.1 oz (85 kg)    GEN: Well nourished, well developed in no acute distress NECK: No JVD; No carotid bruits CARDIAC: RRR, no murmurs, no rubs, no gallops RESPIRATORY:  Clear to auscultation without rales, wheezing or rhonchi  ABDOMEN: Soft, non-tender, non-distended EXTREMITIES:  No edema; No deformity   ASSESSMENT AND PLAN: .    Assessment and Plan Assessment & Plan Essential hypertension Blood pressure has been elevated, ranging from 160s/80s to 140s/90s. Adderall was put on hold, potentially impacting blood pressure control. Emphasized the importance of maintaining target blood pressure to prevent complications such as stroke and myocardial infarction. Blood pressure targets are based on studies showing increased cardiovascular risk with higher pressures. She is motivated to improve health through lifestyle changes, including weight loss and exercise. - Start amlodipine 2.5 mg daily in addition to losartan 100 mg daily. - Refer to hypertension clinic for follow-up in 1-2 months to monitor blood pressure and adjust medication as needed. - Advise on lifestyle modifications, including reducing sodium intake and continuing weight loss efforts.  Mixed  hyperlipidemia LDL cholesterol is elevated at 164 mg/dL, above the target of less than 100 mg/dL. She reports difficulty taking atorvastatin due to pill size and occasional side effects. Discussed switching to rosuvastatin, which is more potent and may have fewer side effects. Rosuvastatin is commonly prescribed and well-tolerated by most patients. - Discontinue atorvastatin 80 mg daily. - Start rosuvastatin 20 mg daily. - Continue  ezetimibe (Zetia) 10 mg daily.  Preoperative examination for general surgical procedure Previously underwent a preoperative examination for knee replacement surgery and has been successful in rehabilitation. She is motivated to continue improving overall health. - Encourage continued participation in physical therapy and exercise programs, such as those offered at [facility].  Goals of Care She expressed a desire to live a long and healthy life, similar to her father who lived to 70 years old. She is motivated to make lifestyle changes to improve her health. - Encourage smoking cessation and continued reduction in smoking. - Support engagement in exercise and healthy lifestyle choices.          Signed, Dorothye Gathers, MD

## 2023-12-21 ENCOUNTER — Ambulatory Visit: Admitting: Physician Assistant

## 2023-12-26 ENCOUNTER — Other Ambulatory Visit (HOSPITAL_BASED_OUTPATIENT_CLINIC_OR_DEPARTMENT_OTHER): Payer: Self-pay

## 2023-12-26 ENCOUNTER — Ambulatory Visit (INDEPENDENT_AMBULATORY_CARE_PROVIDER_SITE_OTHER): Admitting: Physician Assistant

## 2023-12-26 ENCOUNTER — Encounter: Payer: Self-pay | Admitting: Physician Assistant

## 2023-12-26 VITALS — BP 158/80 | HR 88 | Temp 97.3°F | Ht 64.0 in | Wt 178.0 lb

## 2023-12-26 DIAGNOSIS — E782 Mixed hyperlipidemia: Secondary | ICD-10-CM | POA: Diagnosis not present

## 2023-12-26 DIAGNOSIS — F341 Dysthymic disorder: Secondary | ICD-10-CM

## 2023-12-26 DIAGNOSIS — E669 Obesity, unspecified: Secondary | ICD-10-CM

## 2023-12-26 DIAGNOSIS — F909 Attention-deficit hyperactivity disorder, unspecified type: Secondary | ICD-10-CM

## 2023-12-26 DIAGNOSIS — I10 Essential (primary) hypertension: Secondary | ICD-10-CM | POA: Diagnosis not present

## 2023-12-26 LAB — LIPID PANEL
Cholesterol: 157 mg/dL (ref 0–200)
HDL: 56.7 mg/dL (ref >39.00)
LDL Cholesterol: 42 mg/dL (ref 0–99)
NonHDL: 100.3
Total CHOL/HDL Ratio: 3
Triglycerides: 292 mg/dL — ABNORMAL HIGH (ref 0.0–149.0)
VLDL: 58.4 mg/dL — ABNORMAL HIGH (ref 0.0–40.0)

## 2023-12-26 LAB — COMPREHENSIVE METABOLIC PANEL WITH GFR
ALT: 23 U/L (ref 0–35)
AST: 18 U/L (ref 0–37)
Albumin: 4.5 g/dL (ref 3.5–5.2)
Alkaline Phosphatase: 74 U/L (ref 39–117)
BUN: 14 mg/dL (ref 6–23)
CO2: 29 meq/L (ref 19–32)
Calcium: 10 mg/dL (ref 8.4–10.5)
Chloride: 101 meq/L (ref 96–112)
Creatinine, Ser: 0.82 mg/dL (ref 0.40–1.20)
GFR: 75.72 mL/min (ref >60.00)
Glucose, Bld: 109 mg/dL — ABNORMAL HIGH (ref 70–99)
Potassium: 3.3 meq/L — ABNORMAL LOW (ref 3.5–5.1)
Sodium: 138 meq/L (ref 135–145)
Total Bilirubin: 0.5 mg/dL (ref 0.2–1.2)
Total Protein: 7.3 g/dL (ref 6.0–8.3)

## 2023-12-26 LAB — CBC WITH DIFFERENTIAL/PLATELET
Basophils Absolute: 0 10*3/uL (ref 0.0–0.1)
Basophils Relative: 0.5 % (ref 0.0–3.0)
Eosinophils Absolute: 0 10*3/uL (ref 0.0–0.7)
Eosinophils Relative: 0.6 % (ref 0.0–5.0)
HCT: 41.2 % (ref 36.0–46.0)
Hemoglobin: 13.9 g/dL (ref 12.0–15.0)
Lymphocytes Relative: 27.5 % (ref 12.0–46.0)
Lymphs Abs: 2.2 10*3/uL (ref 0.7–4.0)
MCHC: 33.8 g/dL (ref 30.0–36.0)
MCV: 92.3 fl (ref 78.0–100.0)
Monocytes Absolute: 0.3 10*3/uL (ref 0.1–1.0)
Monocytes Relative: 3.9 % (ref 3.0–12.0)
Neutro Abs: 5.3 10*3/uL (ref 1.4–7.7)
Neutrophils Relative %: 67.5 % (ref 43.0–77.0)
Platelets: 292 10*3/uL (ref 150.0–400.0)
RBC: 4.46 Mil/uL (ref 3.87–5.11)
RDW: 13.8 % (ref 11.5–15.5)
WBC: 7.9 10*3/uL (ref 4.0–10.5)

## 2023-12-26 MED ORDER — ALBUTEROL SULFATE HFA 108 (90 BASE) MCG/ACT IN AERS
2.0000 | INHALATION_SPRAY | Freq: Four times a day (QID) | RESPIRATORY_TRACT | 1 refills | Status: AC | PRN
  Filled 2023-12-26: qty 6.7, 25d supply, fill #0

## 2023-12-26 MED ORDER — SEMAGLUTIDE-WEIGHT MANAGEMENT 1.7 MG/0.75ML ~~LOC~~ SOAJ
1.7000 mg | SUBCUTANEOUS | 1 refills | Status: DC
  Filled 2023-12-26: qty 3, 28d supply, fill #0
  Filled 2024-01-21: qty 3, 28d supply, fill #1

## 2023-12-26 MED ORDER — AMPHETAMINE-DEXTROAMPHET ER 15 MG PO CP24
15.0000 mg | ORAL_CAPSULE | ORAL | 0 refills | Status: DC
  Filled 2024-01-25: qty 30, 30d supply, fill #0

## 2023-12-26 MED ORDER — AMPHETAMINE-DEXTROAMPHET ER 15 MG PO CP24
15.0000 mg | ORAL_CAPSULE | ORAL | 0 refills | Status: DC
  Filled 2023-12-26: qty 30, 30d supply, fill #0

## 2023-12-26 MED ORDER — AMPHETAMINE-DEXTROAMPHET ER 15 MG PO CP24
15.0000 mg | ORAL_CAPSULE | ORAL | 0 refills | Status: DC
  Filled 2024-02-23 (×2): qty 30, 30d supply, fill #0

## 2023-12-26 MED ORDER — EZETIMIBE 10 MG PO TABS
10.0000 mg | ORAL_TABLET | Freq: Every day | ORAL | 1 refills | Status: DC
  Filled 2023-12-26: qty 90, 90d supply, fill #0
  Filled 2024-03-24 – 2024-04-04 (×2): qty 90, 90d supply, fill #1

## 2023-12-26 NOTE — Progress Notes (Signed)
 Christina Cowan is a 63 y.o. female here for a follow up of a pre-existing problem.  History of Present Illness:   Chief Complaint  Patient presents with   ADHD   Hypertension   Hypertension: Pt is compliant with Amlodipine  2.5 mg once daily and Losartan  100 mg once daily.  Tolerating her current regimen well with no reported side effects.  She reports a notable difference with her blood pressures since starting Amlodipine .  Denies any BLE edema.   Hyperlipidemia: She last followed up with her cardiologist, Dr. Renna Cary, on 4/17 and was started on Rosuvastatin  at this time.  She is currently on Zetia  10 mg once daily and Rosuvastatin  20 mg once daily.  Good compliance and tolerance to both medications.   Anxiety / Depression / ADD: Pt is taking Effexor  300 mg once daily, Buspar  15 mg BID as needed, and Hydroxyzine  12.5-25 mg every 6 hours as needed. Good compliance and tolerance reported.  She does believe her conditions are well controlled with her current regimen.   Weight management: Pt has lost about 10 lbs since starting Wegovy .  Has been practicing healthier eating habits and drinking more water.  She has also been trying to work on staying more active.  She is interested in increasing Wegovy .  Denies any nausea, constipation, diarrhea, or any other GI issues.   Past Medical History:  Diagnosis Date   Anxiety    Arthritis    Asthma    Essential hypertension, benign 06/17/2014   GERD (gastroesophageal reflux disease)    Pure hypercholesterolemia 06/17/2014     Social History   Tobacco Use   Smoking status: Every Day    Current packs/day: 0.40    Average packs/day: 0.4 packs/day for 16.0 years (6.4 ttl pk-yrs)    Types: Cigarettes   Smokeless tobacco: Never  Substance Use Topics   Alcohol use: Yes    Alcohol/week: 4.0 standard drinks of alcohol    Types: 4 Glasses of wine per week    Comment: occ   Drug use: Yes    Types: Hydrocodone     Past Surgical History:   Procedure Laterality Date   CESAREAN SECTION      Family History  Problem Relation Age of Onset   Arthritis Mother    Hypertension Mother    Arthritis Father    Colon cancer Paternal Uncle     Allergies  Allergen Reactions   Prednisone  Other (See Comments)    Bad thoughts -- only with oral prednisone , can tolerate injection per patient 01/15/20    Current Medications:   Current Outpatient Medications:    amLODipine  (NORVASC ) 2.5 MG tablet, Take 1 tablet (2.5 mg total) by mouth daily., Disp: 90 tablet, Rfl: 3   amphetamine -dextroamphetamine  (ADDERALL XR) 15 MG 24 hr capsule, Take 1 capsule by mouth every morning., Disp: 30 capsule, Rfl: 0   [START ON 01/25/2024] amphetamine -dextroamphetamine  (ADDERALL XR) 15 MG 24 hr capsule, Take 1 capsule by mouth every morning., Disp: 30 capsule, Rfl: 0   [START ON 02/24/2024] amphetamine -dextroamphetamine  (ADDERALL XR) 15 MG 24 hr capsule, Take 1 capsule by mouth every morning., Disp: 30 capsule, Rfl: 0   busPIRone  (BUSPAR ) 15 MG tablet, Take 1 tablet (15 mg total) by mouth 2 (two) times daily as needed., Disp: 60 tablet, Rfl: 2   fexofenadine (ALLEGRA) 180 MG tablet, Take 180 mg by mouth daily., Disp: , Rfl:    fluticasone (FLONASE) 50 MCG/ACT nasal spray, Place 1 spray into both nostrils daily., Disp: , Rfl:  hydrOXYzine  (ATARAX ) 25 MG tablet, Take 0.5-1 tablets (12.5-25 mg total) by mouth every 6 (six) hours as needed for itching., Disp: 45 tablet, Rfl: 0   losartan  (COZAAR ) 100 MG tablet, Take 1 tablet (100 mg total) by mouth daily., Disp: 90 tablet, Rfl: 1   meloxicam  (MOBIC ) 15 MG tablet, Take 1 tablet (15 mg total) by mouth daily., Disp: 30 tablet, Rfl: 1   rosuvastatin  (CRESTOR ) 20 MG tablet, Take 1 tablet (20 mg total) by mouth daily., Disp: 90 tablet, Rfl: 3   Semaglutide -Weight Management 1.7 MG/0.75ML SOAJ, Inject 1.7 mg into the skin once a week., Disp: 3 mL, Rfl: 1   triamcinolone  ointment (KENALOG ) 0.1 %, Apply 1 Application  topically 3 (three) times daily., Disp: 454 g, Rfl: 1   venlafaxine  XR (EFFEXOR -XR) 150 MG 24 hr capsule, Take 2 capsules (300 mg total) by mouth daily with breakfast., Disp: 180 capsule, Rfl: 1   albuterol  (VENTOLIN  HFA) 108 (90 Base) MCG/ACT inhaler, Inhale 2 puffs into the lungs every 6 (six) hours as needed for wheezing or shortness of breath., Disp: 6.7 g, Rfl: 1   ezetimibe  (ZETIA ) 10 MG tablet, Take 1 tablet (10 mg total) by mouth daily., Disp: 90 tablet, Rfl: 1   Review of Systems:   Negative unless otherwise specified per HPI.  Vitals:   Vitals:   12/26/23 1328 12/26/23 1353  BP: (!) 140/78 (!) 158/80  Pulse: 88   Temp: (!) 97.3 F (36.3 C)   TempSrc: Temporal   SpO2: 98%   Weight: 178 lb (80.7 kg)   Height: 5\' 4"  (1.626 m)      Body mass index is 30.55 kg/m.  Physical Exam:   Physical Exam Vitals and nursing note reviewed.  Constitutional:      General: She is not in acute distress.    Appearance: She is well-developed. She is not ill-appearing or toxic-appearing.  Cardiovascular:     Rate and Rhythm: Normal rate and regular rhythm.     Pulses: Normal pulses.     Heart sounds: Normal heart sounds, S1 normal and S2 normal.  Pulmonary:     Effort: Pulmonary effort is normal.     Breath sounds: Normal breath sounds.  Skin:    General: Skin is warm and dry.  Neurological:     Mental Status: She is alert.     GCS: GCS eye subscore is 4. GCS verbal subscore is 5. GCS motor subscore is 6.  Psychiatric:        Speech: Speech normal.        Behavior: Behavior normal. Behavior is cooperative.     Assessment and Plan:   1. Essential hypertension, benign (Primary) Above goal today No evidence of end-organ damage on my exam Recommend patient monitor home blood pressure at least a few times weekly Continue Amlodipine  2.5 mg once daily and Losartan  100 mg daily If home monitoring shows consistent elevation, or any symptom(s) develop, recommend reach out to us  for  further advice on next steps  2. Mixed hyperlipidemia Update lipid panel Continue Crestor  20 mg and Zetia  10 mg daily - Lipid panel  3. Attention deficit hyperactivity disorder (ADHD), unspecified ADHD type PDMP reviewed no red flags Blood pressure is improving so we will continue Adderall 15 mg extended release daily Follow-up in 3 months, sooner if concerns - CBC with Differential/Platelet - Comprehensive metabolic panel with GFR  4. Dysthymic disorder Overall stable Continue Effexor  300 mg daily and BuSpar  15 mg twice daily Follow-up in  3 months, sooner if concerns  5. Obesity, unspecified class, unspecified obesity type, unspecified whether serious comorbidity present Improving Encourage more consistent exercise Increase Wegovy  to 1.7 mg weekly Follow-up in a 3 months, sooner if concerns  I, Bernita Bristle, acting as a Neurosurgeon for Alexander Iba, [place]., have documented all relevant documentation on the behalf of Alexander Iba, [place], as directed by   while in the presence of Alexander Iba, [place].  I, Alexander Iba, [place], have reviewed all documentation for this visit. The documentation on 12/26/23 for the exam, diagnosis, procedures, and orders are all accurate and complete.  Alexander Iba, PA-C

## 2023-12-26 NOTE — Patient Instructions (Addendum)
 It was great to see you!  Try to gradually increase your exercise  Let's follow-up in 3 months, sooner if you have concerns.  Take care,  Alexander Iba PA-C

## 2023-12-27 ENCOUNTER — Encounter: Payer: Self-pay | Admitting: Physician Assistant

## 2023-12-29 ENCOUNTER — Ambulatory Visit: Payer: Self-pay | Admitting: Cardiology

## 2024-01-21 ENCOUNTER — Other Ambulatory Visit: Payer: Self-pay | Admitting: Physician Assistant

## 2024-01-23 ENCOUNTER — Telehealth: Payer: Self-pay | Admitting: *Deleted

## 2024-01-23 ENCOUNTER — Other Ambulatory Visit (HOSPITAL_COMMUNITY): Payer: Self-pay

## 2024-01-23 ENCOUNTER — Telehealth: Payer: Self-pay

## 2024-01-23 ENCOUNTER — Other Ambulatory Visit: Payer: Self-pay

## 2024-01-23 NOTE — Telephone Encounter (Signed)
 Pharmacy Patient Advocate Encounter  Received notification from [facility] that Prior Authorization for Wegovy  1.7MG /0.75ML has been APPROVED from 01/23/24 to 01/22/25. Unable to obtain price due to refill too soon rejection, last fill date 01/21/24 next available fill date06/09/25   PA #/Case ID/Reference #: [id number]

## 2024-01-23 NOTE — Telephone Encounter (Signed)
 Please deny refill was already sent.

## 2024-01-23 NOTE — Telephone Encounter (Signed)
 Pharmacy Patient Advocate Encounter   Received notification from Pt Calls Messages that prior authorization for Wegovy  1.7MG /0.75ML  is required/requested.   Insurance verification completed.   The patient is insured through [facility] .   Per test claim: PA required; PA submitted to above mentioned insurance via CoverMyMeds Key/confirmation #/EOC [id number] Status is pending

## 2024-01-23 NOTE — Telephone Encounter (Signed)
 PA needed for Wegovy  1.7 mg, PA expired on 5825/2025.

## 2024-01-24 ENCOUNTER — Encounter: Payer: Self-pay | Admitting: Physician Assistant

## 2024-01-24 ENCOUNTER — Other Ambulatory Visit: Payer: Self-pay | Admitting: Physician Assistant

## 2024-01-24 NOTE — Telephone Encounter (Signed)
 Rx for Adderall already sent by provider.

## 2024-01-24 NOTE — Telephone Encounter (Signed)
 Rx for Adderall already sent.

## 2024-01-25 ENCOUNTER — Other Ambulatory Visit: Payer: Self-pay

## 2024-01-25 ENCOUNTER — Other Ambulatory Visit (HOSPITAL_BASED_OUTPATIENT_CLINIC_OR_DEPARTMENT_OTHER): Payer: Self-pay

## 2024-01-27 ENCOUNTER — Other Ambulatory Visit (HOSPITAL_BASED_OUTPATIENT_CLINIC_OR_DEPARTMENT_OTHER): Payer: Self-pay

## 2024-01-27 ENCOUNTER — Other Ambulatory Visit: Payer: Self-pay | Admitting: Physician Assistant

## 2024-01-27 DIAGNOSIS — F341 Dysthymic disorder: Secondary | ICD-10-CM

## 2024-01-29 ENCOUNTER — Other Ambulatory Visit: Payer: Self-pay

## 2024-01-29 ENCOUNTER — Other Ambulatory Visit (HOSPITAL_BASED_OUTPATIENT_CLINIC_OR_DEPARTMENT_OTHER): Payer: Self-pay

## 2024-01-29 MED ORDER — VENLAFAXINE HCL ER 150 MG PO CP24
300.0000 mg | ORAL_CAPSULE | Freq: Every day | ORAL | 1 refills | Status: DC
  Filled 2024-01-29: qty 180, 90d supply, fill #0
  Filled 2024-04-30: qty 180, 90d supply, fill #1

## 2024-01-30 ENCOUNTER — Ambulatory Visit (HOSPITAL_BASED_OUTPATIENT_CLINIC_OR_DEPARTMENT_OTHER)

## 2024-01-30 NOTE — Progress Notes (Deleted)
 Office Visit    Patient Name: Christina Cowan Date of Encounter: 01/30/2024  Primary Care Provider:  Alexander Iba, PA Primary Cardiologist:  None  Chief Complaint    Hypertension  Significant Past Medical History   HLD LDL 164, now on rosuvastatin  20, ezetimibe  10  obesity On Wegovy  1.7 mg currently             Allergies  Allergen Reactions   Prednisone  Other (See Comments)    Bad thoughts -- only with oral prednisone , can tolerate injection per patient 01/15/20    History of Present Illness    Christina Cowan is a 63 y.o. female patient of Dr Alda Amas, in the office today for hypertension evaluation.  She was seen by Dr. Alda Amas in April, with a pressure of 146/82 and reported home readings 140-160's/80-90's.  Patient reports having hypertension over 20 years ago, but with significant weight loss, was able to discontinue medications.    Has meloxicam  15 mg on chart  Blood Pressure Goal:  130/80  Current Medications:  amlodipine  2.5 mg every day, losartan  100 mg qd  Previously tried:    Family Hx:     Social Hx:      Tobacco:  Alcohol:  Caffeine: Diet:      Exercise:   Home BP readings:      Adherence Assessment  Do you ever forget to take your medication? [] Yes [] No  Do you ever skip doses due to side effects? [] Yes [] No  Do you have trouble affording your medicines? [] Yes [] No  Are you ever unable to pick up your medication due to transportation difficulties? [] Yes [] No  Do you ever stop taking your medications because you don't believe they are helping? [] Yes [] No  Do you check your weight daily? [] Yes [] No   Adherence strategy: ***  Barriers to obtaining medications: ***     Accessory Clinical Findings    Lab Results  Component Value Date   CREATININE 0.82 12/26/2023   BUN 14 12/26/2023   NA 138 12/26/2023   K 3.3 (L) 12/26/2023   CL 101 12/26/2023   CO2 29 12/26/2023   Lab Results  Component Value Date   ALT 23 12/26/2023   AST 18  12/26/2023   ALKPHOS 74 12/26/2023   BILITOT 0.5 12/26/2023   Lab Results  Component Value Date   HGBA1C 5.4 04/14/2022    Home Medications    Current Outpatient Medications  Medication Sig Dispense Refill   albuterol  (VENTOLIN  HFA) 108 (90 Base) MCG/ACT inhaler Inhale 2 puffs into the lungs every 6 (six) hours as needed for wheezing or shortness of breath. 6.7 g 1   amLODipine  (NORVASC ) 2.5 MG tablet Take 1 tablet (2.5 mg total) by mouth daily. 90 tablet 3   amphetamine -dextroamphetamine  (ADDERALL XR) 15 MG 24 hr capsule Take 1 capsule by mouth every morning. 30 capsule 0   amphetamine -dextroamphetamine  (ADDERALL XR) 15 MG 24 hr capsule Take 1 capsule by mouth every morning. 30 capsule 0   [START ON 02/24/2024] amphetamine -dextroamphetamine  (ADDERALL XR) 15 MG 24 hr capsule Take 1 capsule by mouth every morning. 30 capsule 0   busPIRone  (BUSPAR ) 15 MG tablet Take 1 tablet (15 mg total) by mouth 2 (two) times daily as needed. 60 tablet 2   ezetimibe  (ZETIA ) 10 MG tablet Take 1 tablet (10 mg total) by mouth daily. 90 tablet 1   fexofenadine (ALLEGRA) 180 MG tablet Take 180 mg by mouth daily.     fluticasone (FLONASE) 50 MCG/ACT nasal  spray Place 1 spray into both nostrils daily.     hydrOXYzine  (ATARAX ) 25 MG tablet Take 0.5-1 tablets (12.5-25 mg total) by mouth every 6 (six) hours as needed for itching. 45 tablet 0   losartan  (COZAAR ) 100 MG tablet Take 1 tablet (100 mg total) by mouth daily. 90 tablet 1   meloxicam  (MOBIC ) 15 MG tablet Take 1 tablet (15 mg total) by mouth daily. 30 tablet 1   rosuvastatin  (CRESTOR ) 20 MG tablet Take 1 tablet (20 mg total) by mouth daily. 90 tablet 3   Semaglutide -Weight Management 1.7 MG/0.75ML SOAJ Inject 1.7 mg into the skin once a week. 3 mL 1   triamcinolone  ointment (KENALOG ) 0.1 % Apply 1 Application topically 3 (three) times daily. 454 g 1   venlafaxine  XR (EFFEXOR -XR) 150 MG 24 hr capsule Take 2 capsules (300 mg total) by mouth daily with  breakfast. 180 capsule 1   No current facility-administered medications for this visit.     No BP recorded.  {Refresh Note OR Click here to enter BP  :1}***   Assessment & Plan    No problem-specific Assessment & Plan notes found for this encounter.   Jamilynn Whitacre PharmD CPP [facility]  [address] [place], [place] [address] [phone]

## 2024-02-20 ENCOUNTER — Other Ambulatory Visit (HOSPITAL_BASED_OUTPATIENT_CLINIC_OR_DEPARTMENT_OTHER): Payer: Self-pay

## 2024-02-20 ENCOUNTER — Other Ambulatory Visit: Payer: Self-pay | Admitting: Physician Assistant

## 2024-02-20 NOTE — Telephone Encounter (Signed)
 Pt requesting refill for Adderall XR 15 mg. Last OV 12/26/2023.

## 2024-02-21 ENCOUNTER — Other Ambulatory Visit (HOSPITAL_BASED_OUTPATIENT_CLINIC_OR_DEPARTMENT_OTHER): Payer: Self-pay

## 2024-02-21 ENCOUNTER — Other Ambulatory Visit: Payer: Self-pay

## 2024-02-21 MED ORDER — WEGOVY 1.7 MG/0.75ML ~~LOC~~ SOAJ
1.7000 mg | SUBCUTANEOUS | 1 refills | Status: DC
Start: 2024-02-21 — End: 2024-04-10
  Filled 2024-02-21: qty 3, 28d supply, fill #0
  Filled 2024-03-19: qty 3, 28d supply, fill #1

## 2024-02-23 ENCOUNTER — Encounter (HOSPITAL_BASED_OUTPATIENT_CLINIC_OR_DEPARTMENT_OTHER): Payer: Self-pay

## 2024-02-23 ENCOUNTER — Other Ambulatory Visit (HOSPITAL_BASED_OUTPATIENT_CLINIC_OR_DEPARTMENT_OTHER): Payer: Self-pay

## 2024-02-23 ENCOUNTER — Other Ambulatory Visit: Payer: Self-pay

## 2024-03-19 ENCOUNTER — Other Ambulatory Visit: Payer: Self-pay | Admitting: Physician Assistant

## 2024-03-19 ENCOUNTER — Encounter (HOSPITAL_BASED_OUTPATIENT_CLINIC_OR_DEPARTMENT_OTHER): Payer: Self-pay | Admitting: Pharmacist Clinician (PhC)/ Clinical Pharmacy Specialist

## 2024-03-19 ENCOUNTER — Ambulatory Visit (INDEPENDENT_AMBULATORY_CARE_PROVIDER_SITE_OTHER): Admitting: Pharmacist Clinician (PhC)/ Clinical Pharmacy Specialist

## 2024-03-19 ENCOUNTER — Other Ambulatory Visit (HOSPITAL_BASED_OUTPATIENT_CLINIC_OR_DEPARTMENT_OTHER): Payer: Self-pay

## 2024-03-19 VITALS — BP 152/84 | HR 96

## 2024-03-19 DIAGNOSIS — I10 Essential (primary) hypertension: Secondary | ICD-10-CM

## 2024-03-19 MED ORDER — OLMESARTAN MEDOXOMIL 40 MG PO TABS
40.0000 mg | ORAL_TABLET | Freq: Every day | ORAL | 3 refills | Status: AC
  Filled 2024-03-19: qty 90, 90d supply, fill #0
  Filled 2024-06-02: qty 90, 90d supply, fill #1
  Filled 2024-09-09: qty 90, 90d supply, fill #2

## 2024-03-19 NOTE — Assessment & Plan Note (Signed)
 Assessment: BP is uncontrolled in office BP 152/84 mmHg;  above the goal (<130/80). Tolerates losartan  and amlodipine  well, without any side effects Denies SOB, palpitation, chest pain, headaches,or swelling Reiterated the importance of regular exercise and low salt diet   Plan:  Stop taking losartan  Start taking olmesartan  40 mg daily Continue taking amlodipine  2.5 mg daily Patient to keep record of BP readings with heart rate and report to us  at the next visit Patient to follow up with me in September  Labs ordered today: none

## 2024-03-19 NOTE — Patient Instructions (Signed)
 Follow up appointment: September 11 at 2 pm   Take your BP meds as follows:  STOP LOSARTAN   START OLMESARTAN  40 MG ONCE DAILY  CONTINUE WITH AMLODIPINE    Check your blood pressure at home daily (if able) and keep record of the readings.  Your blood pressure goal is < 130/80  To check your pressure at home you will need to:  1. Sit up in a chair, with feet flat on the floor and back supported. Do not cross your ankles or legs. 2. Rest your left arm so that the cuff is about heart level. If the cuff goes on your upper arm,  then just relax the arm on the table, arm of the chair or your lap. If you have a wrist cuff, we  suggest relaxing your wrist against your chest (think of it as Pledging the Flag with the  wrong arm).  3. Place the cuff snugly around your arm, about 1 inch above the crook of your elbow. The  cords should be inside the groove of your elbow.  4. Sit quietly, with the cuff in place, for about 5 minutes. After that 5 minutes press the power  button to start a reading. 5. Do not talk or move while the reading is taking place.  6. Record your readings on a sheet of paper. Although most cuffs have a memory, it is often  easier to see a pattern developing when the numbers are all in front of you.  7. You can repeat the reading after 1-3 minutes if it is recommended  Make sure your bladder is empty and you have not had caffeine or tobacco within the last 30 min  Always bring your blood pressure log with you to your appointments. If you have not brought your monitor in to be double checked for accuracy, please bring it to your next appointment.  You can find a list of quality blood pressure cuffs at [email or web address]  Important lifestyle changes to control high blood pressure  Intervention  Effect on the BP  Lose extra pounds and watch your waistline Weight loss is one of the most effective lifestyle changes for controlling blood pressure. If you're overweight or obese,  losing even a small amount of weight can help reduce blood pressure. Blood pressure might go down by about 1 millimeter of mercury (mm Hg) with each kilogram (about 2.2 pounds) of weight lost.  Exercise regularly As a general goal, aim for at least 30 minutes of moderate physical activity every day. Regular physical activity can lower high blood pressure by about 5 to 8 mm Hg.  Eat a healthy diet Eating a diet rich in whole grains, fruits, vegetables, and low-fat dairy products and low in saturated fat and cholesterol. A healthy diet can lower high blood pressure by up to 11 mm Hg.  Reduce salt (sodium) in your diet Even a small reduction of sodium in the diet can improve heart health and reduce high blood pressure by about 5 to 6 mm Hg.  Limit alcohol One drink equals 12 ounces of beer, 5 ounces of wine, or 1.5 ounces of 80-proof liquor.  Limiting alcohol to less than one drink a day for women or two drinks a day for men can help lower blood pressure by about 4 mm Hg.   If you have any questions or concerns please use My Chart to send questions or call the office at [phone]

## 2024-03-19 NOTE — Progress Notes (Signed)
 Office Visit    Patient Name: Christina Cowan Date of Encounter: 03/19/2024  Primary Care Provider:  Job Lukes, PA Primary Cardiologist:  None  Chief Complaint    Hypertension - [facility] hypertension clinic  Past Medical History   HLD 11/24 LDL 164, now on rosuvastatin  20 mg  ADHD Currently holding adderall 2/2 uncontrolled BP  obesity BMI 30.55 (-5 kg) currently on Wegovy  1.7 mg    Allergies  Allergen Reactions   Prednisone  Other (See Comments)    Bad thoughts -- only with oral prednisone , can tolerate injection per patient 01/15/20    History of Present Illness    Christina Cowan is a 63 y.o. female patient who was referred to the [facility] by Dr. Oneil Parchment.  She has taken adderall for several years, but stopped it for a few months to see if it was impacting blood pressure.  There was no significant change in readings while off the Adderall, so she restarted it about a month ago.  Notes that she has some stress in her life, as she's helping to care for her 15 year old father.    Blood Pressure Goal:  130/80  Current Medications: losartan  100 mg every day, amlodipine  2.5 mg every day (both in AM)  Family Hx:  father with hypertension, controlled, he's now 25  Social Hx:      Tobacco: 6 per day max  Alcohol: socially - 3 per week  Caffeine: iced tea, unsweet, diluted  Diet:  eats 2-3 times per day smaller portions (on Wegovy  1.7), lots of water; plenty of protein and vegetables     Exercise: walking and swimming most days  Home BP readings:  arm device at home, no readings with her today      Accessory Clinical Findings    Lab Results  Component Value Date   CREATININE 0.82 12/26/2023   BUN 14 12/26/2023   NA 138 12/26/2023   K 3.3 (L) 12/26/2023   CL 101 12/26/2023   CO2 29 12/26/2023   Lab Results  Component Value Date   ALT 23 12/26/2023   AST 18 12/26/2023   ALKPHOS 74 12/26/2023   BILITOT 0.5 12/26/2023   Lab Results  Component  Value Date   HGBA1C 5.4 04/14/2022    Screening for Secondary Hypertension:      Relevant Labs/Studies:    Latest Ref Rng & Units 12/26/2023    1:54 PM 07/18/2023    1:39 PM 02/09/2023    5:31 PM  Basic Labs  Sodium 135 - 145 mEq/L 138  141  137   Potassium 3.5 - 5.1 mEq/L 3.3  4.0  2.9   Creatinine 0.40 - 1.20 mg/dL 9.17  8.89  9.01        Latest Ref Rng & Units 07/13/2012    9:39 AM 06/11/2010   11:11 AM  Thyroid   TSH 0.35 - 5.50 uIU/mL 1.31  0.61                   Home Medications    Current Outpatient Medications  Medication Sig Dispense Refill   olmesartan  (BENICAR ) 40 MG tablet Take 1 tablet (40 mg total) by mouth daily. 90 tablet 3   albuterol  (VENTOLIN  HFA) 108 (90 Base) MCG/ACT inhaler Inhale 2 puffs into the lungs every 6 (six) hours as needed for wheezing or shortness of breath. 6.7 g 1   amLODipine  (NORVASC ) 2.5 MG tablet Take 1 tablet (2.5 mg total) by mouth daily.  90 tablet 3   amphetamine -dextroamphetamine  (ADDERALL XR) 15 MG 24 hr capsule Take 1 capsule by mouth every morning. 30 capsule 0   amphetamine -dextroamphetamine  (ADDERALL XR) 15 MG 24 hr capsule Take 1 capsule by mouth every morning. 30 capsule 0   amphetamine -dextroamphetamine  (ADDERALL XR) 15 MG 24 hr capsule Take 1 capsule by mouth every morning. 30 capsule 0   busPIRone  (BUSPAR ) 15 MG tablet Take 1 tablet (15 mg total) by mouth 2 (two) times daily as needed. 60 tablet 2   ezetimibe  (ZETIA ) 10 MG tablet Take 1 tablet (10 mg total) by mouth daily. 90 tablet 1   fexofenadine (ALLEGRA) 180 MG tablet Take 180 mg by mouth daily.     fluticasone (FLONASE) 50 MCG/ACT nasal spray Place 1 spray into both nostrils daily.     hydrOXYzine  (ATARAX ) 25 MG tablet Take 0.5-1 tablets (12.5-25 mg total) by mouth every 6 (six) hours as needed for itching. 45 tablet 0   meloxicam  (MOBIC ) 15 MG tablet Take 1 tablet (15 mg total) by mouth daily. 30 tablet 1   rosuvastatin  (CRESTOR ) 20 MG tablet Take 1 tablet (20 mg  total) by mouth daily. 90 tablet 3   Semaglutide -Weight Management (WEGOVY ) 1.7 MG/0.75ML SOAJ Inject 1.7 mg into the skin once a week. 3 mL 1   venlafaxine  XR (EFFEXOR -XR) 150 MG 24 hr capsule Take 2 capsules (300 mg total) by mouth daily with breakfast. 180 capsule 1   No current facility-administered medications for this visit.     Assessment & Plan   HYPERTENSION CONTROL Vitals:   03/19/24 1533 03/19/24 1536  BP: (!) 162/84 (!) 152/84    The patient's blood pressure is elevated above target today.  In order to address the patient's elevated BP: A new medication was prescribed today.      Essential hypertension, benign Assessment: BP is uncontrolled in office BP 152/84 mmHg;  above the goal (<130/80). Tolerates losartan  and amlodipine  well, without any side effects Denies SOB, palpitation, chest pain, headaches,or swelling Reiterated the importance of regular exercise and low salt diet   Plan:  Stop taking losartan  Start taking olmesartan  40 mg daily Continue taking amlodipine  2.5 mg daily Patient to keep record of BP readings with heart rate and report to us  at the next visit Patient to follow up with me in September  Labs ordered today: none    Allean Mink PharmD CPP [facility]  [address] [place], [place] [address] [phone]

## 2024-03-20 ENCOUNTER — Other Ambulatory Visit (HOSPITAL_BASED_OUTPATIENT_CLINIC_OR_DEPARTMENT_OTHER): Payer: Self-pay

## 2024-03-20 ENCOUNTER — Other Ambulatory Visit: Payer: Self-pay

## 2024-03-20 MED ORDER — AMPHETAMINE-DEXTROAMPHET ER 15 MG PO CP24
15.0000 mg | ORAL_CAPSULE | ORAL | 0 refills | Status: DC
  Filled 2024-03-20 – 2024-03-22 (×2): qty 30, 30d supply, fill #0

## 2024-03-20 NOTE — Telephone Encounter (Signed)
 Pt requesting refill for Adderall XR 15 mg. Last OV 12/26/2023.

## 2024-03-22 ENCOUNTER — Other Ambulatory Visit (HOSPITAL_BASED_OUTPATIENT_CLINIC_OR_DEPARTMENT_OTHER): Payer: Self-pay

## 2024-03-29 ENCOUNTER — Other Ambulatory Visit (HOSPITAL_BASED_OUTPATIENT_CLINIC_OR_DEPARTMENT_OTHER): Payer: Self-pay

## 2024-04-01 ENCOUNTER — Other Ambulatory Visit (HOSPITAL_BASED_OUTPATIENT_CLINIC_OR_DEPARTMENT_OTHER): Payer: Self-pay

## 2024-04-01 MED ORDER — MELOXICAM 15 MG PO TABS
15.0000 mg | ORAL_TABLET | Freq: Every day | ORAL | 1 refills | Status: AC
  Filled 2024-04-01: qty 30, 30d supply, fill #0
  Filled 2024-05-06: qty 30, 30d supply, fill #1

## 2024-04-04 ENCOUNTER — Other Ambulatory Visit (HOSPITAL_BASED_OUTPATIENT_CLINIC_OR_DEPARTMENT_OTHER): Payer: Self-pay

## 2024-04-08 ENCOUNTER — Encounter: Payer: Self-pay | Admitting: Physician Assistant

## 2024-04-09 NOTE — Telephone Encounter (Signed)
 Please advise if you prefer patient come in for visit/ VV to discuss dose increase

## 2024-04-10 ENCOUNTER — Other Ambulatory Visit (HOSPITAL_BASED_OUTPATIENT_CLINIC_OR_DEPARTMENT_OTHER): Payer: Self-pay

## 2024-04-10 ENCOUNTER — Other Ambulatory Visit: Payer: Self-pay

## 2024-04-10 ENCOUNTER — Encounter: Payer: Self-pay | Admitting: Physician Assistant

## 2024-04-10 ENCOUNTER — Ambulatory Visit (INDEPENDENT_AMBULATORY_CARE_PROVIDER_SITE_OTHER): Admitting: Physician Assistant

## 2024-04-10 VITALS — BP 140/82 | HR 57 | Temp 97.5°F | Ht 64.0 in | Wt 175.0 lb

## 2024-04-10 DIAGNOSIS — Z1211 Encounter for screening for malignant neoplasm of colon: Secondary | ICD-10-CM | POA: Diagnosis not present

## 2024-04-10 DIAGNOSIS — E669 Obesity, unspecified: Secondary | ICD-10-CM

## 2024-04-10 DIAGNOSIS — I10 Essential (primary) hypertension: Secondary | ICD-10-CM

## 2024-04-10 DIAGNOSIS — F909 Attention-deficit hyperactivity disorder, unspecified type: Secondary | ICD-10-CM | POA: Diagnosis not present

## 2024-04-10 MED ORDER — SEMAGLUTIDE-WEIGHT MANAGEMENT 2.4 MG/0.75ML ~~LOC~~ SOAJ
2.4000 mg | SUBCUTANEOUS | 2 refills | Status: DC
  Filled 2024-04-10: qty 3, 28d supply, fill #0
  Filled 2024-04-30 – 2024-05-01 (×2): qty 3, 28d supply, fill #1
  Filled 2024-05-23 – 2024-05-28 (×4): qty 3, 28d supply, fill #2

## 2024-04-10 MED ORDER — AMPHETAMINE-DEXTROAMPHET ER 20 MG PO CP24
20.0000 mg | ORAL_CAPSULE | ORAL | 0 refills | Status: DC
  Filled 2024-04-10: qty 30, 30d supply, fill #0

## 2024-04-10 MED ORDER — AMPHETAMINE-DEXTROAMPHET ER 20 MG PO CP24
20.0000 mg | ORAL_CAPSULE | ORAL | 0 refills | Status: DC
  Filled 2024-05-09: qty 30, 30d supply, fill #0

## 2024-04-10 MED ORDER — AMPHETAMINE-DEXTROAMPHET ER 20 MG PO CP24
20.0000 mg | ORAL_CAPSULE | ORAL | 0 refills | Status: DC
  Filled 2024-06-08: qty 30, 30d supply, fill #0

## 2024-04-10 NOTE — Progress Notes (Signed)
 Christina Cowan is a 63 y.o. female here for a follow up of a pre-existing problem.  History of Present Illness:   Chief Complaint  Patient presents with   Medication Refill    Adderrall and wegovy ; want to discuss increasing dosage of adderall    Discussed the use of AI scribe software for clinical note transcription with the patient, who gave verbal consent to proceed.  History of Present Illness Christina Cowan is a 63 year old female with hypertension who presents for medication management and follow-up.  Her hypertension medication was recently adjusted, switching from losartan  to olmesartan  40 mg daily while continuing amlodipine  2.5 mg daily. She has been on this regimen for three weeks and notes improvement in her blood pressure control. Home blood pressure readings are higher than the office measurement of 140/82 mmHg.  She is on Wegovy  for weight management, experiencing positive results without side effects. She has lost twelve Cowan since November and is on a 1.7 mg dose for over a month. Wegovy  has also reduced her desire to smoke.  She takes Adderall XR 15 mg daily, previously taking up to 60 mg daily. She experiences decreased focus and energy by the afternoon, which she attributes to the medication wearing off.  She continues 300 mg Effexor  and feels it is essential for her well-being. She reports no nausea, constipation, or depression and feels good overall with no concerns about her current medications.    Past Medical History:  Diagnosis Date   Anxiety    Arthritis    Asthma    Essential hypertension, benign 06/17/2014   GERD (gastroesophageal reflux disease)    Pure hypercholesterolemia 06/17/2014     Social History   Tobacco Use   Smoking status: Every Day    Current packs/day: 0.40    Average packs/day: 0.4 packs/day for 16.0 years (6.4 ttl pk-yrs)    Types: Cigarettes   Smokeless tobacco: Never  Substance Use Topics   Alcohol use: Yes    Alcohol/week: 4.0  standard drinks of alcohol    Types: 4 Glasses of wine per week    Comment: occ   Drug use: Yes    Types: Hydrocodone     Past Surgical History:  Procedure Laterality Date   CESAREAN SECTION      Family History  Problem Relation Age of Onset   Arthritis Mother    Hypertension Mother    Arthritis Father    Colon cancer Paternal Uncle     Allergies  Allergen Reactions   Prednisone  Other (See Comments)    Bad thoughts -- only with oral prednisone , can tolerate injection per patient 01/15/20    Current Medications:   Current Outpatient Medications:    albuterol  (VENTOLIN  HFA) 108 (90 Base) MCG/ACT inhaler, Inhale 2 puffs into the lungs every 6 (six) hours as needed for wheezing or shortness of breath., Disp: 6.7 g, Rfl: 1   amLODipine  (NORVASC ) 2.5 MG tablet, Take 1 tablet (2.5 mg total) by mouth daily., Disp: 90 tablet, Rfl: 3   amphetamine -dextroamphetamine  (ADDERALL XR) 20 MG 24 hr capsule, Take 1 capsule (20 mg total) by mouth every morning., Disp: 30 capsule, Rfl: 0   [START ON 05/10/2024] amphetamine -dextroamphetamine  (ADDERALL XR) 20 MG 24 hr capsule, Take 1 capsule (20 mg total) by mouth every morning., Disp: 30 capsule, Rfl: 0   [START ON 06/09/2024] amphetamine -dextroamphetamine  (ADDERALL XR) 20 MG 24 hr capsule, Take 1 capsule (20 mg total) by mouth every morning., Disp: 30 capsule, Rfl: 0  busPIRone  (BUSPAR ) 15 MG tablet, Take 1 tablet (15 mg total) by mouth 2 (two) times daily as needed., Disp: 60 tablet, Rfl: 2   ezetimibe  (ZETIA ) 10 MG tablet, Take 1 tablet (10 mg total) by mouth daily., Disp: 90 tablet, Rfl: 1   fexofenadine (ALLEGRA) 180 MG tablet, Take 180 mg by mouth daily., Disp: , Rfl:    fluticasone (FLONASE) 50 MCG/ACT nasal spray, Place 1 spray into both nostrils daily., Disp: , Rfl:    hydrOXYzine  (ATARAX ) 25 MG tablet, Take 0.5-1 tablets (12.5-25 mg total) by mouth every 6 (six) hours as needed for itching., Disp: 45 tablet, Rfl: 0   meloxicam  (MOBIC ) 15 MG  tablet, Take 1 tablet (15 mg total) by mouth daily., Disp: 30 tablet, Rfl: 1   olmesartan  (BENICAR ) 40 MG tablet, Take 1 tablet (40 mg total) by mouth daily., Disp: 90 tablet, Rfl: 3   rosuvastatin  (CRESTOR ) 20 MG tablet, Take 1 tablet (20 mg total) by mouth daily., Disp: 90 tablet, Rfl: 3   semaglutide -weight management (WEGOVY ) 2.4 MG/0.75ML SOAJ SQ injection, Inject 2.4 mg into the skin once a week., Disp: 3 mL, Rfl: 2   venlafaxine  XR (EFFEXOR -XR) 150 MG 24 hr capsule, Take 2 capsules (300 mg total) by mouth daily with breakfast., Disp: 180 capsule, Rfl: 1   Review of Systems:   Negative unless otherwise specified per HPI.  Vitals:   Vitals:   04/10/24 0831  BP: (!) 140/82  Pulse: (!) 57  Temp: (!) 97.5 F (36.4 C)  SpO2: 97%  Weight: 175 lb (79.4 kg)  Height: 5' 4 (1.626 m)     Body mass index is 30.04 kg/m.  Physical Exam:   Physical Exam Vitals and nursing note reviewed.  Constitutional:      General: She is not in acute distress.    Appearance: She is well-developed. She is not ill-appearing or toxic-appearing.  Cardiovascular:     Rate and Rhythm: Normal rate and regular rhythm.     Pulses: Normal pulses.     Heart sounds: Normal heart sounds, S1 normal and S2 normal.  Pulmonary:     Effort: Pulmonary effort is normal.     Breath sounds: Normal breath sounds.  Skin:    General: Skin is warm and dry.  Neurological:     Mental Status: She is alert.     GCS: GCS eye subscore is 4. GCS verbal subscore is 5. GCS motor subscore is 6.  Psychiatric:        Speech: Speech normal.        Behavior: Behavior normal. Behavior is cooperative.     Assessment and Plan:   Assessment and Plan Assessment & Plan Essential hypertension Hypertension management adjusted to olmesartan  with continued amlodipine . Home readings higher than office, possibly due to cuff inaccuracy. Improvement noted on new regimen. - Continue olmesartan  (Benicar ) as prescribed. - Continue  amlodipine  as prescribed. - Follow up with the pharmacist in September to compare blood pressure cuff accuracy.  Obesity On semaglutide  (Wegovy ) 1.7 mg with 12-pound weight loss since November. Considering dose increase to 2.4 mg. Wegovy  may aid in other addictions and has positive cardiovascular data. - Continue semaglutide  (Wegovy ) 1.7 mg for one more week, then increase to 2.4 mg. - Monitor for any side effects with the increased dose and adjust if necessary.  Attention-deficit hyperactivity disorder (ADHD) Currently on amphetamine -dextroamphetamine  (Adderall XR) 15 mg daily. Reports decreased focus and motivation in the afternoon. Willing to increase dose to 20 mg. -  Increase amphetamine -dextroamphetamine  (Adderall XR) to 20 mg daily. - Follow up with the pharmacist regarding the effect of Adderall on blood pressure. - Schedule follow-up appointments every three months for ADHD management.  External hemorrhoids Inquired about treatment options during colonoscopy. Hemorrhoid banding is an outpatient procedure, not performed during colonoscopy. - Discuss with the gastroenterologist about the possibility of hemorrhoid banding after the colonoscopy.     Lucie Buttner, PA-C

## 2024-04-10 NOTE — Patient Instructions (Signed)
 SABRA

## 2024-04-30 ENCOUNTER — Other Ambulatory Visit (HOSPITAL_BASED_OUTPATIENT_CLINIC_OR_DEPARTMENT_OTHER): Payer: Self-pay

## 2024-05-01 ENCOUNTER — Other Ambulatory Visit (HOSPITAL_BASED_OUTPATIENT_CLINIC_OR_DEPARTMENT_OTHER): Payer: Self-pay

## 2024-05-06 ENCOUNTER — Other Ambulatory Visit (HOSPITAL_BASED_OUTPATIENT_CLINIC_OR_DEPARTMENT_OTHER): Payer: Self-pay

## 2024-05-08 ENCOUNTER — Other Ambulatory Visit (HOSPITAL_BASED_OUTPATIENT_CLINIC_OR_DEPARTMENT_OTHER): Payer: Self-pay

## 2024-05-09 ENCOUNTER — Encounter (HOSPITAL_BASED_OUTPATIENT_CLINIC_OR_DEPARTMENT_OTHER): Payer: Self-pay

## 2024-05-09 ENCOUNTER — Other Ambulatory Visit (HOSPITAL_BASED_OUTPATIENT_CLINIC_OR_DEPARTMENT_OTHER): Payer: Self-pay

## 2024-05-09 ENCOUNTER — Ambulatory Visit (HOSPITAL_BASED_OUTPATIENT_CLINIC_OR_DEPARTMENT_OTHER): Admitting: Pharmacist Clinician (PhC)/ Clinical Pharmacy Specialist

## 2024-05-17 ENCOUNTER — Encounter: Payer: Self-pay | Admitting: Physician Assistant

## 2024-05-24 ENCOUNTER — Other Ambulatory Visit (HOSPITAL_BASED_OUTPATIENT_CLINIC_OR_DEPARTMENT_OTHER): Payer: Self-pay

## 2024-05-25 ENCOUNTER — Other Ambulatory Visit (HOSPITAL_BASED_OUTPATIENT_CLINIC_OR_DEPARTMENT_OTHER): Payer: Self-pay

## 2024-05-27 ENCOUNTER — Encounter (HOSPITAL_BASED_OUTPATIENT_CLINIC_OR_DEPARTMENT_OTHER): Payer: Self-pay

## 2024-05-28 ENCOUNTER — Other Ambulatory Visit (HOSPITAL_BASED_OUTPATIENT_CLINIC_OR_DEPARTMENT_OTHER): Payer: Self-pay

## 2024-06-02 ENCOUNTER — Other Ambulatory Visit: Payer: Self-pay | Admitting: Physician Assistant

## 2024-06-03 NOTE — Telephone Encounter (Signed)
 Pt requesting refill for Adderall XR 20 mg. Last OV 04/10/2024.

## 2024-06-06 ENCOUNTER — Other Ambulatory Visit (HOSPITAL_BASED_OUTPATIENT_CLINIC_OR_DEPARTMENT_OTHER): Payer: Self-pay

## 2024-06-07 ENCOUNTER — Other Ambulatory Visit (HOSPITAL_BASED_OUTPATIENT_CLINIC_OR_DEPARTMENT_OTHER): Payer: Self-pay

## 2024-06-08 ENCOUNTER — Other Ambulatory Visit (HOSPITAL_BASED_OUTPATIENT_CLINIC_OR_DEPARTMENT_OTHER): Payer: Self-pay

## 2024-06-17 ENCOUNTER — Ambulatory Visit (HOSPITAL_BASED_OUTPATIENT_CLINIC_OR_DEPARTMENT_OTHER): Admitting: Pharmacist Clinician (PhC)/ Clinical Pharmacy Specialist

## 2024-06-17 NOTE — Progress Notes (Deleted)
 Office Visit    Patient Name: Christina Cowan Date of Encounter: 06/17/2024  Primary Care Provider:  Job Lukes, PA Primary Cardiologist:  None  Chief Complaint    Hypertension - Advanced hypertension clinic  Past Medical History   HLD 11/24 LDL 164, now on rosuvastatin  20 mg  ADHD Currently holding adderall 2/2 uncontrolled BP  obesity BMI 30.55 (-5 kg) currently on Wegovy  1.7 mg    Allergies  Allergen Reactions   Prednisone  Other (See Comments)    Bad thoughts -- only with oral prednisone , can tolerate injection per patient 01/15/20    History of Present Illness    Christina Cowan is a 63 y.o. female patient who was referred to the [facility] by Dr. Oneil Parchment.  She has taken adderall for several years, but stopped it for a few months to see if it was impacting blood pressure.  There was no significant change in readings while off the Adderall, so she restarted it about a month ago.  Notes that she has some stress in her life, as she's helping to care for her 35 year old father.  When I saw her in July I switched losartan  to olmesartan  40 mg.    Today she returns for follow up.   Blood Pressure Goal:  130/80  Current Medications: losartan  100 mg every day, amlodipine  2.5 mg every day (both in AM)  Family Hx:  father with hypertension, controlled, he's now 5  Social Hx:      Tobacco: 6 per day max  Alcohol: socially - 3 per week  Caffeine: iced tea, unsweet, diluted  Diet:  eats 2-3 times per day smaller portions (on Wegovy  1.7), lots of water; plenty of protein and vegetables     Exercise: walking and swimming most days  Home BP readings:  arm device at home, no readings with her today      Accessory Clinical Findings    Lab Results  Component Value Date   CREATININE 0.82 12/26/2023   BUN 14 12/26/2023   NA 138 12/26/2023   K 3.3 (L) 12/26/2023   CL 101 12/26/2023   CO2 29 12/26/2023   Lab Results  Component Value Date   ALT 23  12/26/2023   AST 18 12/26/2023   ALKPHOS 74 12/26/2023   BILITOT 0.5 12/26/2023   Lab Results  Component Value Date   HGBA1C 5.4 04/14/2022    Screening for Secondary Hypertension:      Relevant Labs/Studies:    Latest Ref Rng & Units 12/26/2023    1:54 PM 07/18/2023    1:39 PM 02/09/2023    5:31 PM  Basic Labs  Sodium 135 - 145 mEq/L 138  141  137   Potassium 3.5 - 5.1 mEq/L 3.3  4.0  2.9   Creatinine 0.40 - 1.20 mg/dL 9.17  8.89  9.01        Latest Ref Rng & Units 07/13/2012    9:39 AM 06/11/2010   11:11 AM  Thyroid   TSH 0.35 - 5.50 uIU/mL 1.31  0.61                   Home Medications    Current Outpatient Medications  Medication Sig Dispense Refill   albuterol  (VENTOLIN  HFA) 108 (90 Base) MCG/ACT inhaler Inhale 2 puffs into the lungs every 6 (six) hours as needed for wheezing or shortness of breath. 6.7 g 1   amLODipine  (NORVASC ) 2.5 MG tablet Take 1 tablet (2.5 mg total)  by mouth daily. 90 tablet 3   amphetamine -dextroamphetamine  (ADDERALL XR) 20 MG 24 hr capsule Take 1 capsule (20 mg total) by mouth every morning. 30 capsule 0   amphetamine -dextroamphetamine  (ADDERALL XR) 20 MG 24 hr capsule Take 1 capsule (20 mg total) by mouth every morning. 30 capsule 0   amphetamine -dextroamphetamine  (ADDERALL XR) 20 MG 24 hr capsule Take 1 capsule (20 mg total) by mouth every morning. 30 capsule 0   busPIRone  (BUSPAR ) 15 MG tablet Take 1 tablet (15 mg total) by mouth 2 (two) times daily as needed. 60 tablet 2   ezetimibe  (ZETIA ) 10 MG tablet Take 1 tablet (10 mg total) by mouth daily. 90 tablet 1   fexofenadine (ALLEGRA) 180 MG tablet Take 180 mg by mouth daily.     fluticasone (FLONASE) 50 MCG/ACT nasal spray Place 1 spray into both nostrils daily.     hydrOXYzine  (ATARAX ) 25 MG tablet Take 0.5-1 tablets (12.5-25 mg total) by mouth every 6 (six) hours as needed for itching. 45 tablet 0   meloxicam  (MOBIC ) 15 MG tablet Take 1 tablet (15 mg total) by mouth daily. 30 tablet 1    olmesartan  (BENICAR ) 40 MG tablet Take 1 tablet (40 mg total) by mouth daily. 90 tablet 3   rosuvastatin  (CRESTOR ) 20 MG tablet Take 1 tablet (20 mg total) by mouth daily. 90 tablet 3   semaglutide -weight management (WEGOVY ) 2.4 MG/0.75ML SOAJ SQ injection Inject 2.4 mg into the skin once a week. 3 mL 2   venlafaxine  XR (EFFEXOR -XR) 150 MG 24 hr capsule Take 2 capsules (300 mg total) by mouth daily with breakfast. 180 capsule 1   No current facility-administered medications for this visit.     Assessment & Plan   No BP recorded.  {Refresh Note OR Click here to enter BP  :1}***   No problem-specific Assessment & Plan notes found for this encounter.   Marcedes Tech PharmD CPP [facility]  [address] [place], [place] [address] [phone]

## 2024-06-23 ENCOUNTER — Encounter: Payer: Self-pay | Admitting: Physician Assistant

## 2024-07-04 ENCOUNTER — Encounter: Payer: Self-pay | Admitting: Physician Assistant

## 2024-07-05 ENCOUNTER — Other Ambulatory Visit: Payer: Self-pay | Admitting: Physician Assistant

## 2024-07-08 ENCOUNTER — Other Ambulatory Visit (HOSPITAL_BASED_OUTPATIENT_CLINIC_OR_DEPARTMENT_OTHER): Payer: Self-pay

## 2024-07-08 ENCOUNTER — Other Ambulatory Visit: Payer: Self-pay | Admitting: *Deleted

## 2024-07-08 MED ORDER — EZETIMIBE 10 MG PO TABS
10.0000 mg | ORAL_TABLET | Freq: Every day | ORAL | 1 refills | Status: AC
  Filled 2024-07-09: qty 90, 90d supply, fill #0
  Filled 2024-09-21 – 2024-10-01 (×2): qty 90, 90d supply, fill #1

## 2024-07-08 MED ORDER — EZETIMIBE 10 MG PO TABS
10.0000 mg | ORAL_TABLET | Freq: Every day | ORAL | 1 refills | Status: DC
  Filled 2024-07-08: qty 90, 90d supply, fill #0

## 2024-07-08 NOTE — Telephone Encounter (Signed)
 Pt requesting refill for Adderall XR 20 mg. Last OV 04/10/2024. Pt is scheduled for OV 07/11/2024.

## 2024-07-09 ENCOUNTER — Other Ambulatory Visit (HOSPITAL_BASED_OUTPATIENT_CLINIC_OR_DEPARTMENT_OTHER): Payer: Self-pay

## 2024-07-09 ENCOUNTER — Encounter: Payer: Self-pay | Admitting: Physician Assistant

## 2024-07-10 ENCOUNTER — Other Ambulatory Visit: Payer: Self-pay | Admitting: Physician Assistant

## 2024-07-10 ENCOUNTER — Other Ambulatory Visit (HOSPITAL_BASED_OUTPATIENT_CLINIC_OR_DEPARTMENT_OTHER): Payer: Self-pay

## 2024-07-10 MED ORDER — AMPHETAMINE-DEXTROAMPHET ER 20 MG PO CP24
20.0000 mg | ORAL_CAPSULE | ORAL | 0 refills | Status: DC
  Filled 2024-07-10: qty 5, 5d supply, fill #0
  Filled 2024-07-10: qty 25, 25d supply, fill #0

## 2024-07-10 NOTE — Telephone Encounter (Signed)
 Please see message. Pt requesting refill for Adderall XR 20 mg. Pt scheduled for an appt 11/13 Thursday.

## 2024-07-11 ENCOUNTER — Ambulatory Visit: Admitting: Physician Assistant

## 2024-07-11 ENCOUNTER — Encounter: Payer: Self-pay | Admitting: Physician Assistant

## 2024-07-11 ENCOUNTER — Ambulatory Visit: Payer: Self-pay | Admitting: Physician Assistant

## 2024-07-11 ENCOUNTER — Other Ambulatory Visit (HOSPITAL_BASED_OUTPATIENT_CLINIC_OR_DEPARTMENT_OTHER): Payer: Self-pay

## 2024-07-11 VITALS — BP 146/74 | HR 81 | Temp 97.3°F | Ht 64.0 in | Wt 174.0 lb

## 2024-07-11 DIAGNOSIS — Z23 Encounter for immunization: Secondary | ICD-10-CM

## 2024-07-11 DIAGNOSIS — F419 Anxiety disorder, unspecified: Secondary | ICD-10-CM | POA: Diagnosis not present

## 2024-07-11 DIAGNOSIS — I10 Essential (primary) hypertension: Secondary | ICD-10-CM

## 2024-07-11 DIAGNOSIS — F909 Attention-deficit hyperactivity disorder, unspecified type: Secondary | ICD-10-CM | POA: Diagnosis not present

## 2024-07-11 DIAGNOSIS — F32A Depression, unspecified: Secondary | ICD-10-CM

## 2024-07-11 DIAGNOSIS — R1011 Right upper quadrant pain: Secondary | ICD-10-CM | POA: Diagnosis not present

## 2024-07-11 LAB — COMPREHENSIVE METABOLIC PANEL WITH GFR
ALT: 60 U/L — ABNORMAL HIGH (ref 0–35)
AST: 77 U/L — ABNORMAL HIGH (ref 0–37)
Albumin: 4.5 g/dL (ref 3.5–5.2)
Alkaline Phosphatase: 67 U/L (ref 39–117)
BUN: 15 mg/dL (ref 6–23)
CO2: 26 meq/L (ref 19–32)
Calcium: 9.6 mg/dL (ref 8.4–10.5)
Chloride: 102 meq/L (ref 96–112)
Creatinine, Ser: 0.99 mg/dL (ref 0.40–1.20)
GFR: 60.17 mL/min (ref >60.00)
Glucose, Bld: 90 mg/dL (ref 70–99)
Potassium: 3.7 meq/L (ref 3.5–5.1)
Sodium: 139 meq/L (ref 135–145)
Total Bilirubin: 0.6 mg/dL (ref 0.2–1.2)
Total Protein: 7.5 g/dL (ref 6.0–8.3)

## 2024-07-11 LAB — LIPID PANEL
Cholesterol: 128 mg/dL (ref 0–200)
HDL: 51 mg/dL (ref >39.00)
LDL Cholesterol: 37 mg/dL (ref 0–99)
NonHDL: 76.61
Total CHOL/HDL Ratio: 3
Triglycerides: 196 mg/dL — ABNORMAL HIGH (ref 0.0–149.0)
VLDL: 39.2 mg/dL (ref 0.0–40.0)

## 2024-07-11 LAB — CBC WITH DIFFERENTIAL/PLATELET
Basophils Absolute: 0 K/uL (ref 0.0–0.1)
Basophils Relative: 0.7 % (ref 0.0–3.0)
Eosinophils Absolute: 0.1 K/uL (ref 0.0–0.7)
Eosinophils Relative: 1.8 % (ref 0.0–5.0)
HCT: 41.2 % (ref 36.0–46.0)
Hemoglobin: 13.8 g/dL (ref 12.0–15.0)
Lymphocytes Relative: 27.4 % (ref 12.0–46.0)
Lymphs Abs: 2 K/uL (ref 0.7–4.0)
MCHC: 33.5 g/dL (ref 30.0–36.0)
MCV: 92.7 fl (ref 78.0–100.0)
Monocytes Absolute: 0.5 K/uL (ref 0.1–1.0)
Monocytes Relative: 7 % (ref 3.0–12.0)
Neutro Abs: 4.5 K/uL (ref 1.4–7.7)
Neutrophils Relative %: 63.1 % (ref 43.0–77.0)
Platelets: 255 K/uL (ref 150.0–400.0)
RBC: 4.44 Mil/uL (ref 3.87–5.11)
RDW: 14 % (ref 11.5–15.5)
WBC: 7.2 K/uL (ref 4.0–10.5)

## 2024-07-11 LAB — LIPASE: Lipase: 25 U/L (ref 11.0–59.0)

## 2024-07-11 MED ORDER — AMPHETAMINE-DEXTROAMPHET ER 20 MG PO CP24
20.0000 mg | ORAL_CAPSULE | ORAL | 0 refills | Status: DC
  Filled 2024-07-11 – 2024-08-05 (×2): qty 30, 30d supply, fill #0

## 2024-07-11 MED ORDER — AMPHETAMINE-DEXTROAMPHET ER 20 MG PO CP24
20.0000 mg | ORAL_CAPSULE | ORAL | 0 refills | Status: DC
  Filled 2024-09-06: qty 30, 30d supply, fill #0

## 2024-07-11 MED ORDER — AMPHETAMINE-DEXTROAMPHET ER 20 MG PO CP24: 20.0000 mg | ORAL_CAPSULE | ORAL | 0 refills | Status: DC

## 2024-07-11 NOTE — Patient Instructions (Signed)
 It was great to see you!  Your blood pressure is elevated in our office today.  I recommend that you monitor this at home.  Your goal blood pressure should be around < 130/80, unless you are over 64 years old, your goal may be closer to 140-150/90. Please note if you have been given other goals from a cardiologist or other healthcare provider, please defer to their recommendations.  When preparing to take your blood pressure: Plan ahead. Don't smoke, drink caffeine  or exercise within 30 minutes before taking your blood pressure. Empty your bladder. Don't take the measurement over clothes. Remove the clothing over the arm that will be used to measure blood pressure. You can use either arm unless otherwise told by a healthcare provider. Usually there is not a big difference between readings on them. Be still. Allow at least five minutes of quiet rest before measurements. Don't talk or use the phone. Sit correctly. Sit with your back straight and supported (on a dining chair, rather than a sofa). Your feet should be flat on the floor. Do not cross your legs. Support your arm on a flat surface. The middle of the cuff should be placed on the upper arm at heart level.  Measure at the same time of the day. Take multiple readings and record the results. Each time you measure, take two readings one minute apart. Record the results and bring in to your next office visit.  In order to know how well the medication is working, I would like you to take your readings 1-2 hours after taking your blood pressure medication if possible. Take your blood pressure measurements and record 2-3 days per week.  If you get a high blood pressure reading: A single high reading is not an immediate cause for alarm. If you get a reading that is higher than normal, take your blood pressure a second time. Write down the results of both measurements. Check with your health care professional to see if there's a health concern or  whether there may be problems with your monitor. If your blood pressure readings are suddenly higher than 180/120 mm Hg, wait at least one minute and test again. If your readings are still very high, contact your health care professional immediately. You could be having a hypertensive crisis. Call 911 if your blood pressure is higher than 180/120 mm Hg and if you are having new signs or symptoms that may include: Chest pain Shortness of breath Back pain Numbness Weakness Change in vision Difficulty speaking Confusion Dizziness Vomiting

## 2024-07-11 NOTE — Progress Notes (Signed)
 Christina Cowan is a 63 y.o. female here for a follow up of a pre-existing problem.  History of Present Illness:   Chief Complaint  Patient presents with   ADHD    Pt here for 3 month f/u, currently taking Adderall XR 20 mg.    Discussed the use of AI scribe software for clinical note transcription with the patient, who gave verbal consent to proceed.  History of Present Illness   Christina Cowan is a 63 year old female who presents with abdominal cramping and concerns about her father's declining health.  She experiences intermittent abdominal cramping described as feeling like 'an alien' moving inside her, sometimes after eating or during bowel movements. The pain can be severe and is occasionally accompanied by visible bulging. Constipation was an issue while on Wegovy , but has improved since stopping the medication.  Her current medications include Adderall 20 mg extended release, Effexor  150 mg, and buspirone  15 mg twice daily. Missing buspirone  increases anxiety and depression, but taking two doses at night improves sleep. She occasionally uses melatonin gummies for sleep. Her blood pressure is managed with amlodipine  and Benicar , with readings typically between 120-140/70-80 mmHg. Recent weight loss and lack of physical activity are noted, affecting her mental health.  She is concerned about her father's health, who experiences dizziness and has a history of valve replacement. He is on blood pressure medication and considering assisted living due to mobility concerns. She shares caregiving responsibilities with her son and manages her father's financial and healthcare decisions. Her sister has dementia and cannot assist. Her boyfriend recently had surgery for ulnar nerve and carpal tunnel issues.        Past Medical History:  Diagnosis Date   Anxiety    Arthritis    Asthma    Essential hypertension, benign 06/17/2014   GERD (gastroesophageal reflux disease)    Pure hypercholesterolemia  06/17/2014     Social History   Tobacco Use   Smoking status: Every Day    Current packs/day: 0.40    Average packs/day: 0.4 packs/day for 16.0 years (6.4 ttl pk-yrs)    Types: Cigarettes   Smokeless tobacco: Never  Substance Use Topics   Alcohol use: Yes    Alcohol/week: 4.0 standard drinks of alcohol    Types: 4 Glasses of wine per week    Comment: occ   Drug use: Yes    Types: Hydrocodone     Past Surgical History:  Procedure Laterality Date   CESAREAN SECTION      Family History  Problem Relation Age of Onset   Arthritis Mother    Hypertension Mother    Arthritis Father    Colon cancer Paternal Uncle     Allergies  Allergen Reactions   Prednisone  Other (See Comments)    Bad thoughts -- only with oral prednisone , can tolerate injection per patient 01/15/20    Current Medications:   Current Outpatient Medications:    albuterol  (VENTOLIN  HFA) 108 (90 Base) MCG/ACT inhaler, Inhale 2 puffs into the lungs every 6 (six) hours as needed for wheezing or shortness of breath., Disp: 6.7 g, Rfl: 1   amLODipine  (NORVASC ) 2.5 MG tablet, Take 1 tablet (2.5 mg total) by mouth daily., Disp: 90 tablet, Rfl: 3   amphetamine -dextroamphetamine  (ADDERALL XR) 20 MG 24 hr capsule, Take 1 capsule (20 mg total) by mouth every morning., Disp: 30 capsule, Rfl: 0   [START ON 08/10/2024] amphetamine -dextroamphetamine  (ADDERALL XR) 20 MG 24 hr capsule, Take 1 capsule (20 mg total) by  mouth every morning., Disp: 30 capsule, Rfl: 0   [START ON 09/09/2024] amphetamine -dextroamphetamine  (ADDERALL XR) 20 MG 24 hr capsule, Take 1 capsule (20 mg total) by mouth every morning., Disp: 30 capsule, Rfl: 0   busPIRone  (BUSPAR ) 15 MG tablet, Take 1 tablet (15 mg total) by mouth 2 (two) times daily as needed., Disp: 60 tablet, Rfl: 2   ezetimibe  (ZETIA ) 10 MG tablet, Take 1 tablet (10 mg total) by mouth daily., Disp: 90 tablet, Rfl: 1   fexofenadine (ALLEGRA) 180 MG tablet, Take 180 mg by mouth daily., Disp: ,  Rfl:    fluticasone (FLONASE) 50 MCG/ACT nasal spray, Place 1 spray into both nostrils daily., Disp: , Rfl:    meloxicam  (MOBIC ) 15 MG tablet, Take 1 tablet (15 mg total) by mouth daily., Disp: 30 tablet, Rfl: 1   olmesartan  (BENICAR ) 40 MG tablet, Take 1 tablet (40 mg total) by mouth daily., Disp: 90 tablet, Rfl: 3   rosuvastatin  (CRESTOR ) 20 MG tablet, Take 1 tablet (20 mg total) by mouth daily., Disp: 90 tablet, Rfl: 3   venlafaxine  XR (EFFEXOR -XR) 150 MG 24 hr capsule, Take 2 capsules (300 mg total) by mouth daily with breakfast., Disp: 180 capsule, Rfl: 1   Review of Systems:   Negative unless otherwise specified per HPI.  Vitals:   Vitals:   07/11/24 0833 07/11/24 0919  BP: (!) 156/70 (!) 146/74  Pulse: 81   Temp: (!) 97.3 F (36.3 C)   TempSrc: Temporal   SpO2: 99%   Weight: 174 lb (78.9 kg)   Height: 5' 4 (1.626 m)      Body mass index is 29.87 kg/m.  Physical Exam:   Physical Exam Vitals and nursing note reviewed.  Constitutional:      General: She is not in acute distress.    Appearance: She is well-developed. She is not ill-appearing or toxic-appearing.  Cardiovascular:     Rate and Rhythm: Normal rate and regular rhythm.     Pulses: Normal pulses.     Heart sounds: Normal heart sounds, S1 normal and S2 normal.  Pulmonary:     Effort: Pulmonary effort is normal.     Breath sounds: Normal breath sounds.  Abdominal:     General: Abdomen is flat. Bowel sounds are normal.     Palpations: Abdomen is soft.     Tenderness: There is abdominal tenderness in the right upper quadrant. There is guarding and rebound. There is no right CVA tenderness or left CVA tenderness. Positive signs include Murphy's sign.  Skin:    General: Skin is warm and dry.  Neurological:     Mental Status: She is alert.     GCS: GCS eye subscore is 4. GCS verbal subscore is 5. GCS motor subscore is 6.  Psychiatric:        Speech: Speech normal.        Behavior: Behavior normal. Behavior  is cooperative.     Assessment and Plan:   Assessment and Plan    Right upper quadrant abdominal pain Intermittent pain possibly due to gallbladder dysfunction or constipation, with consideration of previous Wegovy  use and weight loss. - Ordered ultrasound of liver and gallbladder. - Advised to avoid high-fat foods and constipation. - Instructed to seek emergency care if severe pain develops.  Essential hypertension Blood pressure well-controlled with amlodipine  and olmesartan . Monitoring for potential over-treatment. - Continue current antihypertensive regimen. - Continue close follow up with cardiology  Anxiety and depression Symptoms exacerbated by missed Effexor  doses.  Buspirone  effective for anxiety and sleep. - Continue Effexor  150 mg daily. - Continue buspirone  15 mg twice daily, with option to take two at night for sleep.  Attention-deficit hyperactivity disorder Managed with Adderall extended release 20 mg for focus and motivation. - Continue Adderall extended release 20 mg.  - PDMP reviewed during this encounter.  Follow up in 3 month(s), sooner if concerns         Lucie Buttner, PA-C

## 2024-07-12 ENCOUNTER — Ambulatory Visit
Admission: RE | Admit: 2024-07-12 | Discharge: 2024-07-12 | Disposition: A | Discharge status: Home / Self Care | Source: Ambulatory Visit | Attending: Physician Assistant | Admitting: Physician Assistant

## 2024-07-12 DIAGNOSIS — R1011 Right upper quadrant pain: Secondary | ICD-10-CM

## 2024-07-16 ENCOUNTER — Other Ambulatory Visit: Payer: Self-pay | Admitting: *Deleted

## 2024-07-16 ENCOUNTER — Other Ambulatory Visit (HOSPITAL_BASED_OUTPATIENT_CLINIC_OR_DEPARTMENT_OTHER): Payer: Self-pay

## 2024-07-16 ENCOUNTER — Other Ambulatory Visit: Payer: Self-pay | Admitting: Physician Assistant

## 2024-07-16 ENCOUNTER — Telehealth: Payer: Self-pay | Admitting: Physician Assistant

## 2024-07-16 DIAGNOSIS — R748 Abnormal levels of other serum enzymes: Secondary | ICD-10-CM

## 2024-07-16 DIAGNOSIS — F341 Dysthymic disorder: Secondary | ICD-10-CM

## 2024-07-16 MED ORDER — VENLAFAXINE HCL ER 150 MG PO CP24
300.0000 mg | ORAL_CAPSULE | Freq: Every day | ORAL | 1 refills | Status: AC
  Filled 2024-07-16: qty 180, 90d supply, fill #0
  Filled 2024-09-09 – 2024-10-01 (×3): qty 180, 90d supply, fill #1

## 2024-07-16 NOTE — Telephone Encounter (Signed)
Patient returned call. Requests to be called. 

## 2024-07-16 NOTE — Telephone Encounter (Signed)
 See result notes.

## 2024-07-18 ENCOUNTER — Other Ambulatory Visit (HOSPITAL_BASED_OUTPATIENT_CLINIC_OR_DEPARTMENT_OTHER): Payer: Self-pay

## 2024-07-30 ENCOUNTER — Other Ambulatory Visit

## 2024-08-05 ENCOUNTER — Other Ambulatory Visit (HOSPITAL_BASED_OUTPATIENT_CLINIC_OR_DEPARTMENT_OTHER): Payer: Self-pay

## 2024-08-05 ENCOUNTER — Other Ambulatory Visit: Payer: Self-pay

## 2024-08-07 ENCOUNTER — Other Ambulatory Visit (HOSPITAL_BASED_OUTPATIENT_CLINIC_OR_DEPARTMENT_OTHER): Payer: Self-pay

## 2024-09-02 ENCOUNTER — Other Ambulatory Visit: Payer: Self-pay | Admitting: Physician Assistant

## 2024-09-02 NOTE — Telephone Encounter (Signed)
 Pt requesting refill for Adderall XR 20 mg capsule. Last OV 07/11/2024.

## 2024-09-04 ENCOUNTER — Other Ambulatory Visit (HOSPITAL_BASED_OUTPATIENT_CLINIC_OR_DEPARTMENT_OTHER): Payer: Self-pay

## 2024-09-06 ENCOUNTER — Other Ambulatory Visit (HOSPITAL_BASED_OUTPATIENT_CLINIC_OR_DEPARTMENT_OTHER): Payer: Self-pay

## 2024-09-09 ENCOUNTER — Other Ambulatory Visit: Payer: Self-pay

## 2024-09-10 ENCOUNTER — Other Ambulatory Visit (HOSPITAL_BASED_OUTPATIENT_CLINIC_OR_DEPARTMENT_OTHER): Payer: Self-pay

## 2024-09-18 ENCOUNTER — Ambulatory Visit: Payer: Self-pay

## 2024-09-18 NOTE — Telephone Encounter (Signed)
 Noted pt scheduled for 09/19/24

## 2024-09-18 NOTE — Telephone Encounter (Signed)
 FYI Only or Action Required?: FYI only for provider: appointment scheduled on 1/22.  Patient was last seen in primary care on 07/11/2024 by Job Lukes, PA.  Called Nurse Triage reporting Dizziness.  Symptoms began 2 days ago.  Interventions attempted: Rest, hydration, or home remedies.  Symptoms are: stable.  Triage Disposition: See Physician Within 24 Hours  Patient/caregiver understands and will follow disposition?: Yes   Reason for Triage: symptoms: high bp 202/90 yesterday recent reading 177/88, ear infection/vertigo , extremely dizzy  Reason for Disposition  [1] MODERATE dizziness (e.g., vertigo; feels very unsteady, interferes with normal activities) AND [2] has NOT been evaluated by doctor (or NP/PA) for this  Answer Assessment - Initial Assessment Questions 1. DESCRIPTION: Describe your dizziness.     Room spinning 2. VERTIGO: Do you feel like either you or the room is spinning or tilting?      yes 3. LIGHTHEADED: Do you feel lightheaded? (e.g., somewhat faint, woozy, weak upon standing)     yes 4. SEVERITY: How bad is it?  Can you walk?     None at this time 5. ONSET:  When did the dizziness begin?     Few days ago 6. AGGRAVATING FACTORS: Does anything make it worse? (e.g., standing, change in head position)     Change in position 7. CAUSE: What do you think is causing the dizziness?     Inner ear 8. RECURRENT SYMPTOM: Have you had dizziness before? If Yes, ask: When was the last time? What happened that time?      9. OTHER SYMPTOMS: Do you have any other symptoms? (e.g., earache, headache, numbness, tinnitus, vomiting, weakness)     Ear congestion  Protocols used: Dizziness - Vertigo-A-AH

## 2024-09-19 ENCOUNTER — Encounter: Payer: Self-pay | Admitting: Physician Assistant

## 2024-09-19 ENCOUNTER — Ambulatory Visit: Payer: Self-pay | Admitting: Physician Assistant

## 2024-09-19 ENCOUNTER — Ambulatory Visit: Admitting: Physician Assistant

## 2024-09-19 ENCOUNTER — Other Ambulatory Visit (HOSPITAL_BASED_OUTPATIENT_CLINIC_OR_DEPARTMENT_OTHER): Payer: Self-pay

## 2024-09-19 ENCOUNTER — Ambulatory Visit (HOSPITAL_COMMUNITY)
Admission: RE | Admit: 2024-09-19 | Discharge: 2024-09-19 | Disposition: A | Discharge status: Home / Self Care | Source: Ambulatory Visit | Attending: Vascular Surgery | Admitting: Vascular Surgery

## 2024-09-19 VITALS — BP 160/90 | HR 73 | Temp 96.9°F | Ht 64.0 in | Wt 178.5 lb

## 2024-09-19 DIAGNOSIS — I10 Essential (primary) hypertension: Secondary | ICD-10-CM

## 2024-09-19 DIAGNOSIS — F909 Attention-deficit hyperactivity disorder, unspecified type: Secondary | ICD-10-CM | POA: Diagnosis not present

## 2024-09-19 DIAGNOSIS — M79661 Pain in right lower leg: Secondary | ICD-10-CM | POA: Diagnosis not present

## 2024-09-19 DIAGNOSIS — R42 Dizziness and giddiness: Secondary | ICD-10-CM

## 2024-09-19 LAB — COMPREHENSIVE METABOLIC PANEL WITH GFR
ALT: 17 U/L (ref 3–35)
AST: 16 U/L (ref 5–37)
Albumin: 4.2 g/dL (ref 3.5–5.2)
Alkaline Phosphatase: 64 U/L (ref 39–117)
BUN: 15 mg/dL (ref 6–23)
CO2: 28 meq/L (ref 19–32)
Calcium: 9.6 mg/dL (ref 8.4–10.5)
Chloride: 101 meq/L (ref 96–112)
Creatinine, Ser: 1.04 mg/dL (ref 0.40–1.20)
GFR: 56.64 mL/min — ABNORMAL LOW
Glucose, Bld: 94 mg/dL (ref 70–99)
Potassium: 4.9 meq/L (ref 3.5–5.1)
Sodium: 136 meq/L (ref 135–145)
Total Bilirubin: 0.4 mg/dL (ref 0.2–1.2)
Total Protein: 7.4 g/dL (ref 6.0–8.3)

## 2024-09-19 LAB — CBC WITH DIFFERENTIAL/PLATELET
Basophils Absolute: 0 K/uL (ref 0.0–0.1)
Basophils Relative: 0.4 % (ref 0.0–3.0)
Eosinophils Absolute: 0.1 K/uL (ref 0.0–0.7)
Eosinophils Relative: 1.2 % (ref 0.0–5.0)
HCT: 44.5 % (ref 36.0–46.0)
Hemoglobin: 14.8 g/dL (ref 12.0–15.0)
Lymphocytes Relative: 23.9 % (ref 12.0–46.0)
Lymphs Abs: 1.5 K/uL (ref 0.7–4.0)
MCHC: 33.4 g/dL (ref 30.0–36.0)
MCV: 93.7 fl (ref 78.0–100.0)
Monocytes Absolute: 0.4 K/uL (ref 0.1–1.0)
Monocytes Relative: 6 % (ref 3.0–12.0)
Neutro Abs: 4.3 K/uL (ref 1.4–7.7)
Neutrophils Relative %: 68.5 % (ref 43.0–77.0)
Platelets: 246 K/uL (ref 150.0–400.0)
RBC: 4.75 Mil/uL (ref 3.87–5.11)
RDW: 15 % (ref 11.5–15.5)
WBC: 6.3 K/uL (ref 4.0–10.5)

## 2024-09-19 LAB — LIPID PANEL
Cholesterol: 259 mg/dL — ABNORMAL HIGH (ref 28–200)
HDL: 55.7 mg/dL
LDL Cholesterol: 127 mg/dL — ABNORMAL HIGH (ref 10–99)
NonHDL: 202.91
Total CHOL/HDL Ratio: 5
Triglycerides: 379 mg/dL — ABNORMAL HIGH (ref 10.0–149.0)
VLDL: 75.8 mg/dL — ABNORMAL HIGH (ref 0.0–40.0)

## 2024-09-19 LAB — HEMOGLOBIN A1C: Hgb A1c MFr Bld: 5.5 % (ref 4.6–6.5)

## 2024-09-19 MED ORDER — AMLODIPINE BESYLATE 5 MG PO TABS
5.0000 mg | ORAL_TABLET | Freq: Every day | ORAL | 1 refills | Status: AC
  Filled 2024-09-19: qty 30, 30d supply, fill #0

## 2024-09-19 NOTE — Progress Notes (Addendum)
 "  History of Present Illness:   Chief Complaint  Patient presents with   Dizziness    Pt c/o dizziness x 3 days and blood pressure systolic elevated 202 two days ago down 170's    Discussed the use of AI scribe software for clinical note transcription with the patient, who gave verbal consent to proceed.  History of Present Illness   Christina Cowan is a 64 year old female who presents with vertigo and elevated blood pressure. She is accompanied by her partner.  She describes vertigo triggered by position changes in bed, such as turning or rolling over. Episodes last 30 to 60 seconds and resolve once she is steady or focused. Symptoms began suddenly on Tuesday, and at onset she had difficulty walking steadily. She denies nausea or headache.  She noted an initial blood pressure of 202/90 on the first day of vertigo. Subsequent home readings have decreased, with the most recent 146 systolic. She has had no recent medication changes and denies head trauma or falls. She has withheld Adderall since Monday because of concern about her blood pressure.  She notes a funky sensation in her left ear. She has intermittent head congestion in general and uses Allegra at times, but has no current congestion.  She reports right lower leg swelling and pain from knee to ankle since an incident with dogs over the weekend. It feels like a severe cramp or Charlie horse.  She is concerned because her former husband had blood clots and vertigo, and she worries these symptoms may be similar.     Reports there is no chest pain, shortness of breath, dyspnea on exertion    Past Medical History:  Diagnosis Date   Anxiety    Arthritis    Asthma    Essential hypertension, benign 06/17/2014   GERD (gastroesophageal reflux disease)    Pure hypercholesterolemia 06/17/2014     Social History[1]  Past Surgical History:  Procedure Laterality Date   CESAREAN SECTION      Family History  Problem Relation Age  of Onset   Arthritis Mother    Hypertension Mother    Arthritis Father    Colon cancer Paternal Uncle     Allergies[2]  Current Medications:  Current Medications[3]   Review of Systems:   Negative unless otherwise specified per HPI.  Vitals:   Vitals:   09/19/24 1029 09/19/24 1100  BP: (!) 146/80 (!) 160/90  Pulse: 73   Temp: (!) 96.9 F (36.1 C)   TempSrc: Temporal   SpO2: 98%   Weight: 178 lb 8 oz (81 kg)   Height: 5' 4 (1.626 m)      Body mass index is 30.64 kg/m.  Physical Exam:   Physical Exam Vitals and nursing note reviewed.  Constitutional:      General: She is not in acute distress.    Appearance: She is well-developed. She is not ill-appearing or toxic-appearing.  Cardiovascular:     Rate and Rhythm: Normal rate and regular rhythm.     Pulses:          Dorsalis pedis pulses are 2+ on the right side.       Posterior tibial pulses are 2+ on the right side.     Heart sounds: Normal heart sounds, S1 normal and S2 normal.  Pulmonary:     Effort: Pulmonary effort is normal.     Breath sounds: Normal breath sounds.  Musculoskeletal:     Comments: Tenderness to palpation to right posterior  calf with mild swelling  Skin:    General: Skin is warm and dry.  Neurological:     General: No focal deficit present.     Mental Status: She is alert.     GCS: GCS eye subscore is 4. GCS verbal subscore is 5. GCS motor subscore is 6.     Cranial Nerves: Cranial nerves 2-12 are intact.     Sensory: Sensation is intact.     Motor: Motor function is intact.     Coordination: Coordination is intact.     Gait: Gait is intact.  Psychiatric:        Speech: Speech normal.        Behavior: Behavior normal. Behavior is cooperative.     Assessment and Plan:   Assessment and Plan    Dizziness  Classic vertigo symptoms triggered by positional changes, differential includes classic vertigo versus other causes. No red flag symptom(s) on my exam. - Referred to physical  therapy for vertigo exercises. - Provided instructions for home exercises, option to cancel appointment if successful. - Advised emergency care for severe headache or vision changes or any other new/concerning symptom(s)   Right calf pain  Concern for deep vein thrombosis due to family history and symptoms. - Ordered ultrasound of the right lower extremity.  Essential hypertension, benign  Blood pressure initially elevated, now improved. Management crucial due to potential impact on vertigo. - Increased amlodipine  to 5 mg daily. - Continue current blood pressure medications. - Monitor blood pressure regularly. - Consider close follow up with cardiology if blood pressure remains uncontrolled  Attention deficit hyperactivity disorder Adderall held due to concerns about blood pressure and vertigo. - Continue to hold Adderall until blood pressure stabilizes and vertigo resolves.         Lucie Buttner, PA-C     [1]  Social History Tobacco Use   Smoking status: Every Day    Current packs/day: 0.40    Average packs/day: 0.4 packs/day for 16.0 years (6.4 ttl pk-yrs)    Types: Cigarettes   Smokeless tobacco: Never  Substance Use Topics   Alcohol use: Yes    Alcohol/week: 4.0 standard drinks of alcohol    Types: 4 Glasses of wine per week    Comment: occ   Drug use: Yes    Types: Hydrocodone   [2]  Allergies Allergen Reactions   Prednisone  Other (See Comments)    Bad thoughts -- only with oral prednisone , can tolerate injection per patient 01/15/20  [3]  Current Outpatient Medications:    albuterol  (VENTOLIN  HFA) 108 (90 Base) MCG/ACT inhaler, Inhale 2 puffs into the lungs every 6 (six) hours as needed for wheezing or shortness of breath., Disp: 6.7 g, Rfl: 1   amLODipine  (NORVASC ) 5 MG tablet, Take 1 tablet (5 mg total) by mouth daily., Disp: 30 tablet, Rfl: 1   amphetamine -dextroamphetamine  (ADDERALL XR) 20 MG 24 hr capsule, Take 1 capsule (20 mg total) by mouth every  morning., Disp: 30 capsule, Rfl: 0   amphetamine -dextroamphetamine  (ADDERALL XR) 20 MG 24 hr capsule, Take 1 capsule (20 mg total) by mouth every morning., Disp: 30 capsule, Rfl: 0   amphetamine -dextroamphetamine  (ADDERALL XR) 20 MG 24 hr capsule, Take 1 capsule (20 mg total) by mouth every morning., Disp: 30 capsule, Rfl: 0   busPIRone  (BUSPAR ) 15 MG tablet, Take 1 tablet (15 mg total) by mouth 2 (two) times daily as needed., Disp: 60 tablet, Rfl: 2   ezetimibe  (ZETIA ) 10 MG tablet, Take 1 tablet (10  mg total) by mouth daily., Disp: 90 tablet, Rfl: 1   fexofenadine (ALLEGRA) 180 MG tablet, Take 180 mg by mouth daily., Disp: , Rfl:    fluticasone (FLONASE) 50 MCG/ACT nasal spray, Place 1 spray into both nostrils daily., Disp: , Rfl:    meloxicam  (MOBIC ) 15 MG tablet, Take 1 tablet (15 mg total) by mouth daily., Disp: 30 tablet, Rfl: 1   olmesartan  (BENICAR ) 40 MG tablet, Take 1 tablet (40 mg total) by mouth daily., Disp: 90 tablet, Rfl: 3   rosuvastatin  (CRESTOR ) 20 MG tablet, Take 1 tablet (20 mg total) by mouth daily., Disp: 90 tablet, Rfl: 3   venlafaxine  XR (EFFEXOR -XR) 150 MG 24 hr capsule, Take 2 capsules (300 mg total) by mouth daily with breakfast., Disp: 180 capsule, Rfl: 1  "

## 2024-09-19 NOTE — Patient Instructions (Signed)
 VISIT SUMMARY: During your visit, we addressed your vertigo, elevated blood pressure, and right lower leg pain. We discussed your symptoms, potential causes, and treatment plans.  YOUR PLAN: BENIGN PAROXYSMAL POSITIONAL VERTIGO: You have vertigo triggered by position changes, which is likely benign paroxysmal positional vertigo. -You are referred to physical therapy for vertigo exercises. -You were given instructions for home exercises and can cancel the physical therapy appointment if the exercises are successful. -Seek emergency care if you experience a severe headache or vision changes.  RIGHT LOWER EXTREMITY PAIN, RULE OUT DEEP VEIN THROMBOSIS: You have pain and swelling in your right lower leg, and we need to rule out deep vein thrombosis due to your family history and symptoms. -An ultrasound of your right lower leg has been ordered.  ESSENTIAL HYPERTENSION: Your blood pressure was initially very high but has improved. Managing your blood pressure is important, especially because it can affect your vertigo. -Your amlodipine  dose has been increased to 5 mg daily. -Continue taking your current blood pressure medications. -Monitor your blood pressure regularly.  ATTENTION DEFICIT HYPERACTIVITY DISORDER: You have been holding your Adderall due to concerns about your blood pressure and vertigo. -Continue to hold Adderall until your blood pressure stabilizes and your vertigo resolves.  [facility] [address] [place],  [place]  [address] Main: [phone]  Contains text generated by [employer].

## 2024-09-20 ENCOUNTER — Encounter: Payer: Self-pay | Admitting: Physical Therapy

## 2024-09-20 ENCOUNTER — Other Ambulatory Visit: Payer: Self-pay

## 2024-09-20 ENCOUNTER — Ambulatory Visit: Attending: Physician Assistant | Admitting: Physical Therapy

## 2024-09-20 DIAGNOSIS — H8112 Benign paroxysmal vertigo, left ear: Secondary | ICD-10-CM | POA: Diagnosis present

## 2024-09-20 DIAGNOSIS — R42 Dizziness and giddiness: Secondary | ICD-10-CM | POA: Insufficient documentation

## 2024-09-20 NOTE — Therapy (Signed)
 " OUTPATIENT PHYSICAL THERAPY VESTIBULAR EVALUATION     Patient Name: Christina Cowan MRN: [id number] DOB:Sep 08, 1960, 64 y.o., female Today's Date: 09/20/2024  END OF SESSION:  PT End of Session - 09/20/24 1117     Visit Number 1    Number of Visits 9    Date for Recertification  10/18/24    Authorization Type [employer]-auth submitted    PT Start Time [phone]    PT Stop Time 1017    PT Time Calculation (min) 38 min    Activity Tolerance Patient tolerated treatment well    Behavior During Therapy [facility] for tasks assessed/performed          Past Medical History:  Diagnosis Date   Anxiety    Arthritis    Asthma    Essential hypertension, benign 06/17/2014   GERD (gastroesophageal reflux disease)    Pure hypercholesterolemia 06/17/2014   Past Surgical History:  Procedure Laterality Date   CESAREAN SECTION     Patient Active Problem List   Diagnosis Date Noted   Osteoarthritis of left knee 03/25/2022   Mixed hyperlipidemia 08/06/2019   Essential hypertension, benign 06/17/2014   Shingles rash 03/18/2014   GERD 12/16/2009   Vitamin D deficiency 12/24/2008   Dysthymic disorder 07/24/2007   Attention deficit disorder 07/24/2007    PCP: Job Lukes, PA  REFERRING PROVIDER: Job Lukes, PA   REFERRING DIAG: R42 (ICD-10-CM) - Dizziness   THERAPY DIAG:  Dizziness and giddiness  BPPV (benign paroxysmal positional vertigo), left  ONSET DATE: 09/17/2024  Rationale for Evaluation and Treatment: Rehabilitation  SUBJECTIVE:   SUBJECTIVE STATEMENT: Dizziness started on Tuesday, when I got up.  Rolling over, turning over in bed, has been horendous, like I'm falling off the bed.  The whole room spins.  Last night and this morning, it was better.  Was a little unsteady when I got up this morning. Pt accompanied by: self  PERTINENT HISTORY: anxiety, arthritis, asthma, GERD, HTN  PAIN:  Are you having pain? No  PRECAUTIONS: None  RED FLAGS: None   WEIGHT  BEARING RESTRICTIONS: No  FALLS: Has patient fallen in last 6 months? No  LIVING ENVIRONMENT: Lives with: lives with their family; stays with elderly father Lives in: House/apartment Stairs: stairs and handrails Has following equipment at home: None  PLOF: Independent  PATIENT GOALS: To get rid of dizziness  OBJECTIVE:  Note: Objective measures were completed at Evaluation unless otherwise noted.  DIAGNOSTIC FINDINGS: No evidence of DVT with doppler study 09/20/2024  COGNITION: Overall cognitive status: Within functional limits for tasks assessed   POSTURE:  rounded shoulders and forward head  Cervical ROM:    Active A/PROM (deg) eval  Flexion 20  Extension 25  Right lateral flexion   Left lateral flexion   Right rotation 45  Left rotation 55  (Blank rows = not tested)  BED MOBILITY:  Independent, guarded  TRANSFERS: Assistive device utilized: None  Sit to stand: Complete Independence Stand to sit: Complete Independence  GAIT: Gait pattern: WFL Distance walked: clinic distances Assistive device utilized: None Level of assistance: Complete Independence Comments: No gait assessment completed at eval today  PATIENT SURVEYS:  DHI: THE DIZZINESS HANDICAP INVENTORY (DHI)  P1. Does looking up increase your problem? 2 = Sometimes  E2. Because of your problem, do you feel frustrated? 4 = Yes  F3. Because of your problem, do you restrict your travel for business or recreation?  2 = Sometimes  P4. Does walking down the aisle of a  supermarket increase your problems?  2 = Sometimes  F5. Because of your problem, do you have difficulty getting into or out of bed?  4 = Yes  F6. Does your problem significantly restrict your participation in social activities, such as going out to dinner, going to the movies, dancing, or going to parties? 4 = Yes  F7. Because of your problem, do you have difficulty reading?  2 = Sometimes  P8. Does performing more ambitious activities such  as sports, dancing, household chores (sweeping or putting dishes away) increase your problems?  4 = Yes  E9. Because of your problem, are you afraid to leave your home without having without having someone accompany you?  4 = Yes  E10. Because of your problem have you been embarrassed in front of others?  0 = No  P11. Do quick movements of your head increase your problem?  2 = Sometimes  F12. Because of your problem, do you avoid heights?  2 = Sometimes  P13. Does turning over in bed increase your problem?  4 = Yes  F14. Because of your problem, is it difficult for you to do strenuous homework or yard work? 2 = Sometimes  E15. Because of your problem, are you afraid people may think you are intoxicated? 2 = Sometimes  F16. Because of your problem, is it difficult for you to go for a walk by yourself?  2 = Sometimes  P17. Does walking down a sidewalk increase your problem?  2 = Sometimes  E18.Because of your problem, is it difficult for you to concentrate 2 = Sometimes  F19. Because of your problem, is it difficult for you to walk around your house in the dark? 2 = Sometimes  E20. Because of your problem, are you afraid to stay home alone?  0 = No  E21. Because of your problem, do you feel handicapped? 2 = Sometimes  E22. Has the problem placed stress on your relationships with members of your family or friends? 2 = Sometimes  E23. Because of your problem, are you depressed?  2 = Sometimes  F24. Does your problem interfere with your job or household responsibilities?  2 = Sometimes  P25. Does bending over increase your problem?  2 = Sometimes  TOTAL 58    DHI Scoring Instructions  The patient is asked to answer each question as it pertains to dizziness or unsteadiness problems, specifically  considering their condition during the last month. Questions are designed to incorporate functional (F), physical  (P), and emotional (E) impacts on disability.   Scores greater than 10 points should be  referred to balance specialists for further evaluation.   16-34 Points (mild handicap)  36-52 Points (moderate handicap)  54+ Points (severe handicap)  Minimally Detectable Change: 17 points ([address] [place], 1990)  [place], G. SHAUNNA. and [place], C. W. (1990). The development of the Dizziness Handicap Inventory. Archives of Otolaryngology - Head and Neck Surgery 116(4): W1515059.   VESTIBULAR ASSESSMENT:  GENERAL OBSERVATION: Pt in no acute distress, but appears guarded with bed mobility   SYMPTOM BEHAVIOR:  Subjective history: Dizziness started on Tuesday, when I tried to get up and rolling in bed.  Felt like the room was spinning.  Denies head injury, fall, viral illness, or migraine history.  No hx of vertigo in the past.  Non-Vestibular symptoms: changes in hearing, neck pain, and headaches feels like L ear is full  Type of dizziness: Imbalance (Disequilibrium) and Spinning/Vertigo  Frequency: lying down/getting up  since Thursday  Duration: <1 minute  Aggravating factors: Induced by position change: lying supine, rolling to the right, rolling to the left, and supine to sit  Relieving factors: head stationary, closing eyes, and slow movements  Progression of symptoms: better  OCULOMOTOR EXAM:  Ocular Alignment: normal  Ocular ROM: No Limitations  Spontaneous Nystagmus: absent  Gaze-Induced Nystagmus: absent  Smooth Pursuits: intact  Saccades: intact  Convergence/Divergence: 3 cm   Cover-cross-cover test: NT   VESTIBULAR - OCULAR REFLEX:   Slow VOR: Normal  VOR Cancellation: Normal  Head-Impulse Test: HIT Right: negative HIT Left: negative  Dynamic Visual Acuity: NT   POSITIONAL TESTING: Right Dix-Hallpike: upbeating, right nystagmus and Duration: 30 sec and latent onset Left Dix-Hallpike: upbeating, left nystagmus and Duration: 20-30sec; significant dizziness and spinning reported Right Roll Test: no nystagmus Left Roll Test: rotational nystagmus >30 sec, swimmy upon  sitting up  MOTION SENSITIVITY:NOT tested at eval  Motion Sensitivity Quotient Intensity: 0 = none, 1 = Lightheaded, 2 = Mild, 3 = Moderate, 4 = Severe, 5 = Vomiting  Intensity  1. Sitting to supine   2. Supine to L side   3. Supine to R side   4. Supine to sitting   5. L Hallpike-Dix   6. Up from L    7. R Hallpike-Dix   8. Up from R    9. Sitting, head tipped to L knee   10. Head up from L knee   11. Sitting, head tipped to R knee   12. Head up from R knee   13. Sitting head turns x5   14.Sitting head nods x5   15. In stance, 180 turn to L    16. In stance, 180 turn to R                                                                                                                                 TREATMENT DATE: 09/20/2024 2nd L DH test: negative nystagmus and no symptoms after CRM  Canalith Repositioning:  Epley Left: Number of Reps: 1, Response to Treatment: symptoms improved, and Comment: pt is sweaty and hot after maneuver, but no nausea   *Pt has definite spinning and dizziness in position 2 and position 3 of Epley.  PATIENT EDUCATION: Education details: Eval results, POC, rationale for BPPV treatment, post CRM expectations Person educated: Patient Education method: Explanation, Demonstration, and Verbal cues Education comprehension: verbalized understanding  HOME EXERCISE PROGRAM:  GOALS: Goals reviewed with patient? Yes  SHORT TERM GOALS: = LTGS  LONG TERM GOALS: Target date: 10/18/2024  Pt will be independent with HEP for improved dizziness. Baseline: no current HEP Goal status: INITIAL  2.  Pt will report 0/10 dizziness with bed mobility. Baseline: dizziness with rolling and supine/sit Goal status: INITIAL  3.  Pt will improve DHI score to less than or equal to 40, for improved dizziness impacting ADLs. Baseline: 58/100 Goal status: INITIAL  ASSESSMENT:  CLINICAL IMPRESSION: Patient is a 64 y.o. female who was seen today for physical therapy  evaluation and treatment for dizziness.  She reports severe spinning and dizziness that started earlier this week with getting up from bed.  She notes symptoms are room spinning and unsteadiness, with rolling and with lying down/sitting up from bed.  She notes slight improvement in the past day.  She denies hx of migraines, falls, hitting her head, hx of viral illness, hx of vertigo.  Oculomotor testing with [facility] today, slowed VOR, VOR cancellation and HIT are [facility] today.  With positional testing, she has latent onset of dizziness/nytstagmus with R Trenda Craze; she has definite  nystagmus and spinning with L [employer].  She feels dizziness with R and L rolling.  Treated L posterior canal BPPV with Epley maneuver, with symptoms resolved upon additional trial of L DH.  *Of note, pt did experience dizziness in position 2 of Epley maneuver, so pt may in fact have multi-canal BPPV.  She will benefit from skilled PT to further assess and treat BPPV for improved overall functional mobility.  OBJECTIVE IMPAIRMENTS: dizziness.   ACTIVITY LIMITATIONS: bed mobility  PARTICIPATION LIMITATIONS: community activity  PERSONAL FACTORS: 3+ comorbidities: see above PMH are also affecting patient's functional outcome.   REHAB POTENTIAL: Good  CLINICAL DECISION MAKING: Stable/uncomplicated  EVALUATION COMPLEXITY: Low   PLAN:  PT FREQUENCY: 1-2x/week  PT DURATION: 4 weeksplus eval  PLANNED INTERVENTIONS: 97750- Physical Performance Testing, 97110-Therapeutic exercises, 97530- Therapeutic activity, [id number]- Neuromuscular re-education, 97535- Self Care, 04007- Canalith repositioning, Patient/Family education, Balance training, and Vestibular training  PLAN FOR NEXT SESSION: Reasesse L DH; also need to reassess R DH and treat as needed.     STARLET GREIG ORN., PT 09/20/2024, 11:18 AM   [facility] [address] [place], [address] [place], [place] [address] Phone # [phone] Fax # [phone]  Referring diagnosis:  R42 (ICD-10-CM) - Dizziness Treatment diagnosis (if different than referring diagnosis): R42, H81.12 Date Symptoms Began: 09/17/2024 # of Visits requested: 9  Time period for Authorization: 09/20/2024 to 10/18/2024  What was this (referring dx) caused by? []  Surgery []  Fall []  Ongoing issue []  Arthritis [x]  Other: _____BPPV_______  Laterality: []  Rt [x]  Lt []  Both  Functional Tool & Score: DHI 58/100  Check all possible CPT codes:     See Planned Interventions listed in the Plan section of the Evaluation.     If [employer]: Choose 10 or less codes  If [employer]: Modalities are not covered  If [employer]: Check allowed ICD code combinations   If [facility] Plan or [employer]: Cognitive training not covered  "

## 2024-09-20 NOTE — Progress Notes (Signed)
 Spoke with patient directly. Reviewed results with patient and she expressed understanding of Treatment plan. Also expressed understanding to call office with questions or concerns if symptoms don't improve.

## 2024-09-21 ENCOUNTER — Encounter: Payer: Self-pay | Admitting: Physician Assistant

## 2024-09-22 ENCOUNTER — Other Ambulatory Visit: Payer: Self-pay

## 2024-09-26 ENCOUNTER — Ambulatory Visit

## 2024-09-30 ENCOUNTER — Other Ambulatory Visit (HOSPITAL_BASED_OUTPATIENT_CLINIC_OR_DEPARTMENT_OTHER): Payer: Self-pay

## 2024-10-01 ENCOUNTER — Other Ambulatory Visit: Payer: Self-pay

## 2024-10-01 ENCOUNTER — Other Ambulatory Visit: Payer: Self-pay | Admitting: Physician Assistant

## 2024-10-01 ENCOUNTER — Other Ambulatory Visit (HOSPITAL_BASED_OUTPATIENT_CLINIC_OR_DEPARTMENT_OTHER): Payer: Self-pay

## 2024-10-01 NOTE — Therapy (Incomplete)
 " OUTPATIENT PHYSICAL THERAPY VESTIBULAR TREATMENT     Patient Name: Christina Cowan MRN: [id number] DOB:[birth year], 64 y.o., female Today's Date: 10/01/2024  END OF SESSION:    Past Medical History:  Diagnosis Date   Anxiety    Arthritis    Asthma    Essential hypertension, benign 06/17/2014   GERD (gastroesophageal reflux disease)    Pure hypercholesterolemia 06/17/2014   Past Surgical History:  Procedure Laterality Date   CESAREAN SECTION     Patient Active Problem List   Diagnosis Date Noted   Osteoarthritis of left knee 03/25/2022   Mixed hyperlipidemia 08/06/2019   Essential hypertension, benign 06/17/2014   Shingles rash 03/18/2014   GERD 12/16/2009   Vitamin D deficiency 12/24/2008   Dysthymic disorder 07/24/2007   Attention deficit disorder 07/24/2007    PCP: Job Lukes, PA  REFERRING PROVIDER: Job Lukes, PA   REFERRING DIAG: R42 (ICD-10-CM) - Dizziness   THERAPY DIAG:  No diagnosis found.  ONSET DATE: 09/17/2024  Rationale for Evaluation and Treatment: Rehabilitation  SUBJECTIVE:   SUBJECTIVE STATEMENT: Dizziness started on Tuesday, when I got up.  Rolling over, turning over in bed, has been horendous, like I'm falling off the bed.  The whole room spins.  Last night and this morning, it was better.  Was a little unsteady when I got up this morning. Pt accompanied by: self  PERTINENT HISTORY: anxiety, arthritis, asthma, GERD, HTN  PAIN:  Are you having pain? No  PRECAUTIONS: None  RED FLAGS: None   WEIGHT BEARING RESTRICTIONS: No  FALLS: Has patient fallen in last 6 months? No  LIVING ENVIRONMENT: Lives with: lives with their family; stays with elderly father Lives in: House/apartment Stairs: stairs and handrails Has following equipment at home: None  PLOF: Independent  PATIENT GOALS: To get rid of dizziness  OBJECTIVE:      TODAY'S TREATMENT: 10/02/24 Activity Comments                            Note:  Objective measures were completed at Evaluation unless otherwise noted.  DIAGNOSTIC FINDINGS: No evidence of DVT with doppler study 09/20/2024  COGNITION: Overall cognitive status: Within functional limits for tasks assessed   POSTURE:  rounded shoulders and forward head  Cervical ROM:    Active A/PROM (deg) eval  Flexion 20  Extension 25  Right lateral flexion   Left lateral flexion   Right rotation 45  Left rotation 55  (Blank rows = not tested)  BED MOBILITY:  Independent, guarded  TRANSFERS: Assistive device utilized: None  Sit to stand: Complete Independence Stand to sit: Complete Independence  GAIT: Gait pattern: WFL Distance walked: clinic distances Assistive device utilized: None Level of assistance: Complete Independence Comments: No gait assessment completed at eval today  PATIENT SURVEYS:  DHI: THE DIZZINESS HANDICAP INVENTORY (DHI)  P1. Does looking up increase your problem? 2 = Sometimes  E2. Because of your problem, do you feel frustrated? 4 = Yes  F3. Because of your problem, do you restrict your travel for business or recreation?  2 = Sometimes  P4. Does walking down the aisle of a supermarket increase your problems?  2 = Sometimes  F5. Because of your problem, do you have difficulty getting into or out of bed?  4 = Yes  F6. Does your problem significantly restrict your participation in social activities, such as going out to dinner, going to the movies, dancing, or going to parties? 4 =  Yes  F7. Because of your problem, do you have difficulty reading?  2 = Sometimes  P8. Does performing more ambitious activities such as sports, dancing, household chores (sweeping or putting dishes away) increase your problems?  4 = Yes  E9. Because of your problem, are you afraid to leave your home without having without having someone accompany you?  4 = Yes  E10. Because of your problem have you been embarrassed in front of others?  0 = No  P11. Do quick movements  of your head increase your problem?  2 = Sometimes  F12. Because of your problem, do you avoid heights?  2 = Sometimes  P13. Does turning over in bed increase your problem?  4 = Yes  F14. Because of your problem, is it difficult for you to do strenuous homework or yard work? 2 = Sometimes  E15. Because of your problem, are you afraid people may think you are intoxicated? 2 = Sometimes  F16. Because of your problem, is it difficult for you to go for a walk by yourself?  2 = Sometimes  P17. Does walking down a sidewalk increase your problem?  2 = Sometimes  E18.Because of your problem, is it difficult for you to concentrate 2 = Sometimes  F19. Because of your problem, is it difficult for you to walk around your house in the dark? 2 = Sometimes  E20. Because of your problem, are you afraid to stay home alone?  0 = No  E21. Because of your problem, do you feel handicapped? 2 = Sometimes  E22. Has the problem placed stress on your relationships with members of your family or friends? 2 = Sometimes  E23. Because of your problem, are you depressed?  2 = Sometimes  F24. Does your problem interfere with your job or household responsibilities?  2 = Sometimes  P25. Does bending over increase your problem?  2 = Sometimes  TOTAL 58    DHI Scoring Instructions  The patient is asked to answer each question as it pertains to dizziness or unsteadiness problems, specifically  considering their condition during the last month. Questions are designed to incorporate functional (F), physical  (P), and emotional (E) impacts on disability.   Scores greater than 10 points should be referred to balance specialists for further evaluation.   16-34 Points (mild handicap)  36-52 Points (moderate handicap)  54+ Points (severe handicap)  Minimally Detectable Change: 17 points ([address] [place], 1990)  [place], G. SHAUNNA. and [place], C. W. (1990). The development of the Dizziness Handicap Inventory. Archives of  Otolaryngology - Head and Neck Surgery 116(4): [id number].   VESTIBULAR ASSESSMENT:  GENERAL OBSERVATION: Pt in no acute distress, but appears guarded with bed mobility   SYMPTOM BEHAVIOR:  Subjective history: Dizziness started on Tuesday, when I tried to get up and rolling in bed.  Felt like the room was spinning.  Denies head injury, fall, viral illness, or migraine history.  No hx of vertigo in the past.  Non-Vestibular symptoms: changes in hearing, neck pain, and headaches feels like L ear is full  Type of dizziness: Imbalance (Disequilibrium) and Spinning/Vertigo  Frequency: lying down/getting up since Thursday  Duration: <1 minute  Aggravating factors: Induced by position change: lying supine, rolling to the right, rolling to the left, and supine to sit  Relieving factors: head stationary, closing eyes, and slow movements  Progression of symptoms: better  OCULOMOTOR EXAM:  Ocular Alignment: normal  Ocular ROM: No Limitations  Spontaneous Nystagmus:  absent  Gaze-Induced Nystagmus: absent  Smooth Pursuits: intact  Saccades: intact  Convergence/Divergence: 3 cm   Cover-cross-cover test: NT   VESTIBULAR - OCULAR REFLEX:   Slow VOR: Normal  VOR Cancellation: Normal  Head-Impulse Test: HIT Right: negative HIT Left: negative  Dynamic Visual Acuity: NT   POSITIONAL TESTING: Right Dix-Hallpike: upbeating, right nystagmus and Duration: 30 sec and latent onset Left Dix-Hallpike: upbeating, left nystagmus and Duration: 20-30sec; significant dizziness and spinning reported Right Roll Test: no nystagmus Left Roll Test: rotational nystagmus >30 sec, swimmy upon sitting up  MOTION SENSITIVITY:NOT tested at eval  Motion Sensitivity Quotient Intensity: 0 = none, 1 = Lightheaded, 2 = Mild, 3 = Moderate, 4 = Severe, 5 = Vomiting  Intensity  1. Sitting to supine   2. Supine to L side   3. Supine to R side   4. Supine to sitting   5. L Hallpike-Dix   6. Up from L    7. R Hallpike-Dix    8. Up from R    9. Sitting, head tipped to L knee   10. Head up from L knee   11. Sitting, head tipped to R knee   12. Head up from R knee   13. Sitting head turns x5   14.Sitting head nods x5   15. In stance, 180 turn to L    16. In stance, 180 turn to R                                                                                                                                 TREATMENT DATE: 09/20/2024 2nd L DH test: negative nystagmus and no symptoms after CRM  Canalith Repositioning:  Epley Left: Number of Reps: 1, Response to Treatment: symptoms improved, and Comment: pt is sweaty and hot after maneuver, but no nausea   *Pt has definite spinning and dizziness in position 2 and position 3 of Epley.  PATIENT EDUCATION: Education details: Eval results, POC, rationale for BPPV treatment, post CRM expectations Person educated: Patient Education method: Explanation, Demonstration, and Verbal cues Education comprehension: verbalized understanding  HOME EXERCISE PROGRAM:  GOALS: Goals reviewed with patient? Yes  SHORT TERM GOALS: = LTGS  LONG TERM GOALS: Target date: 10/18/2024  Pt will be independent with HEP for improved dizziness. Baseline: no current HEP Goal status: IN PROGRESS  2.  Pt will report 0/10 dizziness with bed mobility. Baseline: dizziness with rolling and supine/sit Goal status: IN PROGRESS  3.  Pt will improve DHI score to less than or equal to 40, for improved dizziness impacting ADLs. Baseline: 58/100 Goal status: IN PROGRESS  ASSESSMENT:  CLINICAL IMPRESSION: Patient is a 64 y.o. female who was seen today for physical therapy evaluation and treatment for dizziness.  She reports severe spinning and dizziness that started earlier this week with getting up from bed.  She notes symptoms are room spinning and unsteadiness, with rolling and with lying down/sitting  up from bed.  She notes slight improvement in the past day.  She denies hx of  migraines, falls, hitting her head, hx of viral illness, hx of vertigo.  Oculomotor testing with [facility] today, slowed VOR, VOR cancellation and HIT are [facility] today.  With positional testing, she has latent onset of dizziness/nytstagmus with R Trenda Craze; she has definite  nystagmus and spinning with L [employer].  She feels dizziness with R and L rolling.  Treated L posterior canal BPPV with Epley maneuver, with symptoms resolved upon additional trial of L DH.  *Of note, pt did experience dizziness in position 2 of Epley maneuver, so pt may in fact have multi-canal BPPV.  She will benefit from skilled PT to further assess and treat BPPV for improved overall functional mobility.  OBJECTIVE IMPAIRMENTS: dizziness.   ACTIVITY LIMITATIONS: bed mobility  PARTICIPATION LIMITATIONS: community activity  PERSONAL FACTORS: 3+ comorbidities: see above PMH are also affecting patient's functional outcome.   REHAB POTENTIAL: Good  CLINICAL DECISION MAKING: Stable/uncomplicated  EVALUATION COMPLEXITY: Low   PLAN:  PT FREQUENCY: 1-2x/week  PT DURATION: 4 weeksplus eval  PLANNED INTERVENTIONS: 97750- Physical Performance Testing, 97110-Therapeutic exercises, 97530- Therapeutic activity, [id number]- Neuromuscular re-education, 97535- Self Care, 04007- Canalith repositioning, Patient/Family education, Balance training, and Vestibular training  PLAN FOR NEXT SESSION: Reasesse L DH; also need to reassess R DH and treat as needed.        "

## 2024-10-02 ENCOUNTER — Telehealth: Payer: Self-pay | Admitting: *Deleted

## 2024-10-02 ENCOUNTER — Telehealth: Payer: Self-pay | Admitting: Physical Therapy

## 2024-10-02 ENCOUNTER — Ambulatory Visit: Admitting: Physical Therapy

## 2024-10-02 NOTE — Telephone Encounter (Signed)
 LVM regarding pt's missed PT appt today and requested pt call back to schedule additional appts if needed, as today was her last scheduled PT appt.  Greig Anon, PT 10/02/24 9:47 AM Phone: [phone] Fax: [phone]

## 2024-10-02 NOTE — Therapy (Incomplete)
 " OUTPATIENT PHYSICAL THERAPY VESTIBULAR TREATMENT NOTE     Patient Name: Christina Cowan MRN: [id number] DOB:[birth year], 64 y.o., female Today's Date: 10/02/2024  END OF SESSION:    Past Medical History:  Diagnosis Date   Anxiety    Arthritis    Asthma    Essential hypertension, benign 06/17/2014   GERD (gastroesophageal reflux disease)    Pure hypercholesterolemia 06/17/2014   Past Surgical History:  Procedure Laterality Date   CESAREAN SECTION     Patient Active Problem List   Diagnosis Date Noted   Osteoarthritis of left knee 03/25/2022   Mixed hyperlipidemia 08/06/2019   Essential hypertension, benign 06/17/2014   Shingles rash 03/18/2014   GERD 12/16/2009   Vitamin D deficiency 12/24/2008   Dysthymic disorder 07/24/2007   Attention deficit disorder 07/24/2007    PCP: Job Lukes, PA  REFERRING PROVIDER: Job Lukes, PA   REFERRING DIAG: R42 (ICD-10-CM) - Dizziness   THERAPY DIAG:  No diagnosis found.  ONSET DATE: 09/17/2024  Rationale for Evaluation and Treatment: Rehabilitation  SUBJECTIVE:   SUBJECTIVE STATEMENT: ***Dizziness started on Tuesday, when I got up.  Rolling over, turning over in bed, has been horendous, like I'm falling off the bed.  The whole room spins.  Last night and this morning, it was better.  Was a little unsteady when I got up this morning. Pt accompanied by: self  PERTINENT HISTORY: anxiety, arthritis, asthma, GERD, HTN  PAIN:  Are you having pain? No  PRECAUTIONS: None  RED FLAGS: None   WEIGHT BEARING RESTRICTIONS: No  FALLS: Has patient fallen in last 6 months? No  LIVING ENVIRONMENT: Lives with: lives with their family; stays with elderly father Lives in: House/apartment Stairs: stairs and handrails Has following equipment at home: None  PLOF: Independent  PATIENT GOALS: To get rid of dizziness  OBJECTIVE:   TODAY'S TREATMENT: 10/02/2024 Activity Comments                       ---------------------------------------- Note: Objective measures were completed at Evaluation unless otherwise noted.  DIAGNOSTIC FINDINGS: No evidence of DVT with doppler study 09/20/2024  COGNITION: Overall cognitive status: Within functional limits for tasks assessed   POSTURE:  rounded shoulders and forward head  Cervical ROM:    Active A/PROM (deg) eval  Flexion 20  Extension 25  Right lateral flexion   Left lateral flexion   Right rotation 45  Left rotation 55  (Blank rows = not tested)  BED MOBILITY:  Independent, guarded  TRANSFERS: Assistive device utilized: None  Sit to stand: Complete Independence Stand to sit: Complete Independence  GAIT: Gait pattern: WFL Distance walked: clinic distances Assistive device utilized: None Level of assistance: Complete Independence Comments: No gait assessment completed at eval today  PATIENT SURVEYS:  DHI: THE DIZZINESS HANDICAP INVENTORY (DHI)  P1. Does looking up increase your problem? 2 = Sometimes  E2. Because of your problem, do you feel frustrated? 4 = Yes  F3. Because of your problem, do you restrict your travel for business or recreation?  2 = Sometimes  P4. Does walking down the aisle of a supermarket increase your problems?  2 = Sometimes  F5. Because of your problem, do you have difficulty getting into or out of bed?  4 = Yes  F6. Does your problem significantly restrict your participation in social activities, such as going out to dinner, going to the movies, dancing, or going to parties? 4 = Yes  F7. Because of your problem,  do you have difficulty reading?  2 = Sometimes  P8. Does performing more ambitious activities such as sports, dancing, household chores (sweeping or putting dishes away) increase your problems?  4 = Yes  E9. Because of your problem, are you afraid to leave your home without having without having someone accompany you?  4 = Yes  E10. Because of your problem have you been embarrassed  in front of others?  0 = No  P11. Do quick movements of your head increase your problem?  2 = Sometimes  F12. Because of your problem, do you avoid heights?  2 = Sometimes  P13. Does turning over in bed increase your problem?  4 = Yes  F14. Because of your problem, is it difficult for you to do strenuous homework or yard work? 2 = Sometimes  E15. Because of your problem, are you afraid people may think you are intoxicated? 2 = Sometimes  F16. Because of your problem, is it difficult for you to go for a walk by yourself?  2 = Sometimes  P17. Does walking down a sidewalk increase your problem?  2 = Sometimes  E18.Because of your problem, is it difficult for you to concentrate 2 = Sometimes  F19. Because of your problem, is it difficult for you to walk around your house in the dark? 2 = Sometimes  E20. Because of your problem, are you afraid to stay home alone?  0 = No  E21. Because of your problem, do you feel handicapped? 2 = Sometimes  E22. Has the problem placed stress on your relationships with members of your family or friends? 2 = Sometimes  E23. Because of your problem, are you depressed?  2 = Sometimes  F24. Does your problem interfere with your job or household responsibilities?  2 = Sometimes  P25. Does bending over increase your problem?  2 = Sometimes  TOTAL 58    DHI Scoring Instructions  The patient is asked to answer each question as it pertains to dizziness or unsteadiness problems, specifically  considering their condition during the last month. Questions are designed to incorporate functional (F), physical  (P), and emotional (E) impacts on disability.   Scores greater than 10 points should be referred to balance specialists for further evaluation.   16-34 Points (mild handicap)  36-52 Points (moderate handicap)  54+ Points (severe handicap)  Minimally Detectable Change: 17 points ([address] [place], 1990)  [place], G. SHAUNNA. and [place], C. W. (1990). The development of  the Dizziness Handicap Inventory. Archives of Otolaryngology - Head and Neck Surgery 116(4): [id number].   VESTIBULAR ASSESSMENT:  GENERAL OBSERVATION: Pt in no acute distress, but appears guarded with bed mobility   SYMPTOM BEHAVIOR:  Subjective history: Dizziness started on Tuesday, when I tried to get up and rolling in bed.  Felt like the room was spinning.  Denies head injury, fall, viral illness, or migraine history.  No hx of vertigo in the past.  Non-Vestibular symptoms: changes in hearing, neck pain, and headaches feels like L ear is full  Type of dizziness: Imbalance (Disequilibrium) and Spinning/Vertigo  Frequency: lying down/getting up since Thursday  Duration: <1 minute  Aggravating factors: Induced by position change: lying supine, rolling to the right, rolling to the left, and supine to sit  Relieving factors: head stationary, closing eyes, and slow movements  Progression of symptoms: better  OCULOMOTOR EXAM:  Ocular Alignment: normal  Ocular ROM: No Limitations  Spontaneous Nystagmus: absent  Gaze-Induced Nystagmus: absent  Smooth  Pursuits: intact  Saccades: intact  Convergence/Divergence: 3 cm   Cover-cross-cover test: NT   VESTIBULAR - OCULAR REFLEX:   Slow VOR: Normal  VOR Cancellation: Normal  Head-Impulse Test: HIT Right: negative HIT Left: negative  Dynamic Visual Acuity: NT   POSITIONAL TESTING: Right Dix-Hallpike: upbeating, right nystagmus and Duration: 30 sec and latent onset Left Dix-Hallpike: upbeating, left nystagmus and Duration: 20-30sec; significant dizziness and spinning reported Right Roll Test: no nystagmus Left Roll Test: rotational nystagmus >30 sec, swimmy upon sitting up  MOTION SENSITIVITY:NOT tested at eval  Motion Sensitivity Quotient Intensity: 0 = none, 1 = Lightheaded, 2 = Mild, 3 = Moderate, 4 = Severe, 5 = Vomiting  Intensity  1. Sitting to supine   2. Supine to L side   3. Supine to R side   4. Supine to sitting   5. L  Hallpike-Dix   6. Up from L    7. R Hallpike-Dix   8. Up from R    9. Sitting, head tipped to L knee   10. Head up from L knee   11. Sitting, head tipped to R knee   12. Head up from R knee   13. Sitting head turns x5   14.Sitting head nods x5   15. In stance, 180 turn to L    16. In stance, 180 turn to R                                                                                                                                 TREATMENT DATE: 09/20/2024 2nd L DH test: negative nystagmus and no symptoms after CRM  Canalith Repositioning:  Epley Left: Number of Reps: 1, Response to Treatment: symptoms improved, and Comment: pt is sweaty and hot after maneuver, but no nausea   *Pt has definite spinning and dizziness in position 2 and position 3 of Epley.  PATIENT EDUCATION: Education details: Eval results, POC, rationale for BPPV treatment, post CRM expectations Person educated: Patient Education method: Explanation, Demonstration, and Verbal cues Education comprehension: verbalized understanding  HOME EXERCISE PROGRAM:  GOALS: Goals reviewed with patient? Yes  SHORT TERM GOALS: = LTGS  LONG TERM GOALS: Target date: 10/18/2024  Pt will be independent with HEP for improved dizziness. Baseline: no current HEP Goal status: INITIAL  2.  Pt will report 0/10 dizziness with bed mobility. Baseline: dizziness with rolling and supine/sit Goal status: INITIAL  3.  Pt will improve DHI score to less than or equal to 40, for improved dizziness impacting ADLs. Baseline: 58/100 Goal status: INITIAL  ASSESSMENT:  CLINICAL IMPRESSION: Pt presents today ***. Skilled PT session focused on ***. Pt needs ***. Pt will continue to benefit from skilled PT towards goals for improved functional mobility and decreased fall risk.   Patient is a 64 y.o. female who was seen today for physical therapy evaluation and treatment for dizziness.  She reports severe spinning and dizziness that  started earlier this week with getting up from bed.  She notes symptoms are room spinning and unsteadiness, with rolling and with lying down/sitting up from bed.  She notes slight improvement in the past day.  She denies hx of migraines, falls, hitting her head, hx of viral illness, hx of vertigo.  Oculomotor testing with [facility] today, slowed VOR, VOR cancellation and HIT are [facility] today.  With positional testing, she has latent onset of dizziness/nytstagmus with R Trenda Craze; she has definite  nystagmus and spinning with L [employer].  She feels dizziness with R and L rolling.  Treated L posterior canal BPPV with Epley maneuver, with symptoms resolved upon additional trial of L DH.  *Of note, pt did experience dizziness in position 2 of Epley maneuver, so pt may in fact have multi-canal BPPV.  She will benefit from skilled PT to further assess and treat BPPV for improved overall functional mobility.  OBJECTIVE IMPAIRMENTS: dizziness.   ACTIVITY LIMITATIONS: bed mobility  PARTICIPATION LIMITATIONS: community activity  PERSONAL FACTORS: 3+ comorbidities: see above PMH are also affecting patient's functional outcome.   REHAB POTENTIAL: Good  CLINICAL DECISION MAKING: Stable/uncomplicated  EVALUATION COMPLEXITY: Low   PLAN:  PT FREQUENCY: 1-2x/week  PT DURATION: 4 weeksplus eval  PLANNED INTERVENTIONS: 97750- Physical Performance Testing, 97110-Therapeutic exercises, 97530- Therapeutic activity, [id number]- Neuromuscular re-education, 97535- Self Care, 04007- Canalith repositioning, Patient/Family education, Balance training, and Vestibular training  PLAN FOR NEXT SESSION: ***Reasesse L DH; also need to reassess R DH and treat as needed.     STARLET GREIG ORN., PT 10/02/2024, 8:01 AM   [facility] [address] [place], [address] [place], [place] [address] Phone # [phone] Fax # [phone]  Referring diagnosis:  R42 (ICD-10-CM) - Dizziness Treatment  diagnosis (if different than referring diagnosis): R42, H81.12 Date Symptoms Began: 09/17/2024 # of Visits requested: 9  Time period for Authorization: 09/20/2024 to 10/18/2024  What was this (referring dx) caused by? []  Surgery []  Fall []  Ongoing issue []  Arthritis [x]  Other: _____BPPV_______  Laterality: []  Rt [x]  Lt []  Both  Functional Tool & Score: DHI 58/100  Check all possible CPT codes:     See Planned Interventions listed in the Plan section of the Evaluation.     If [employer]: Choose 10 or less codes  If [employer]: Modalities are not covered  If [employer]: Check allowed ICD code combinations   If [facility] Plan or [employer]: Cognitive training not covered  "

## 2024-10-02 NOTE — Telephone Encounter (Signed)
 Please see message from patient

## 2024-10-02 NOTE — Telephone Encounter (Signed)
 See other message, sent to provider. Pt called back.

## 2024-10-02 NOTE — Telephone Encounter (Signed)
 Left message on voicemail to call office.

## 2024-10-02 NOTE — Telephone Encounter (Signed)
 Refill requests for Adderall XR 20 mg. I already sent you a message yesterday. Pharmacy sent again.

## 2024-10-02 NOTE — Telephone Encounter (Signed)
 Copied from CRM [id number]. Topic: General - Other >> Oct 02, 2024  3:21 PM Winona R wrote: Pt calling to provide PA [facility] an update on her dizziness and BP related to Adderall refill.  Dizziness has completely stopped after physical therapy, Blood pressure has been about 130/80-81 highest was about 138/ 79 a few days ago.

## 2024-10-03 ENCOUNTER — Other Ambulatory Visit (HOSPITAL_BASED_OUTPATIENT_CLINIC_OR_DEPARTMENT_OTHER): Payer: Self-pay

## 2024-10-03 ENCOUNTER — Other Ambulatory Visit: Payer: Self-pay | Admitting: Physician Assistant

## 2024-10-03 MED ORDER — AMPHETAMINE-DEXTROAMPHET ER 20 MG PO CP24: 20.0000 mg | ORAL_CAPSULE | ORAL | 0 refills | Status: AC

## 2024-10-03 MED ORDER — AMPHETAMINE-DEXTROAMPHET ER 20 MG PO CP24: 20.0000 mg | ORAL_CAPSULE | ORAL | 0 refills | Status: DC

## 2024-10-03 MED ORDER — AMPHETAMINE-DEXTROAMPHET ER 20 MG PO CP24
20.0000 mg | ORAL_CAPSULE | ORAL | 0 refills | Status: DC
  Filled 2024-10-03 – 2024-10-04 (×2): qty 30, 30d supply, fill #0

## 2024-10-04 ENCOUNTER — Other Ambulatory Visit: Payer: Self-pay

## 2024-10-04 ENCOUNTER — Other Ambulatory Visit: Payer: Self-pay | Admitting: *Deleted

## 2024-10-04 ENCOUNTER — Telehealth: Payer: Self-pay | Admitting: Physician Assistant

## 2024-10-04 ENCOUNTER — Other Ambulatory Visit (HOSPITAL_BASED_OUTPATIENT_CLINIC_OR_DEPARTMENT_OTHER): Payer: Self-pay

## 2024-10-04 MED ORDER — AMPHETAMINE-DEXTROAMPHET ER 20 MG PO CP24: 20.0000 mg | ORAL_CAPSULE | ORAL | 0 refills | Status: AC

## 2024-10-04 NOTE — Telephone Encounter (Signed)
 Patient states [facility] is out of stock for RX sent 10/03/24 for  amphetamine -dextroamphetamine  (ADDERALL XR) 20 MG 24 hr capsule  Requests the above RX be sent to:   [facility] [id number] [place], [place] - [address] Phone: [phone]  Fax: [phone]     Which has the medication in stock

## 2024-10-04 NOTE — Telephone Encounter (Signed)
 See other message, already sent to provider.

## 2024-10-04 NOTE — Telephone Encounter (Signed)
 Copied from CRM #[id number]. Topic: Clinical - Prescription Issue >> Oct 04, 2024  3:02 PM Shereese L wrote: Reason for CRM: Patient is calling in to have medication amphetamine -dextroamphetamine  (ADDERALL XR) 20 MG 24 hr capsule transferred to [facility] [id number] [place], [place] - [address] [place] [address] [facility] is [facility] of [facility]

## 2024-10-04 NOTE — Telephone Encounter (Signed)
 Please see message, pharmacy has been changed.
